# Patient Record
Sex: Female | Born: 1965 | Race: Black or African American | Hispanic: No | Marital: Single | Smoking: Never smoker
Health system: Southern US, Community
[De-identification: ages and names within clinical notes are randomized; demographics above are authoritative.]

## PROBLEM LIST (undated history)

## (undated) DIAGNOSIS — E7800 Pure hypercholesterolemia, unspecified: Secondary | ICD-10-CM

## (undated) DIAGNOSIS — M069 Rheumatoid arthritis, unspecified: Secondary | ICD-10-CM

## (undated) DIAGNOSIS — Z9079 Acquired absence of other genital organ(s): Secondary | ICD-10-CM

## (undated) DIAGNOSIS — N809 Endometriosis, unspecified: Secondary | ICD-10-CM

## (undated) DIAGNOSIS — F329 Major depressive disorder, single episode, unspecified: Secondary | ICD-10-CM

## (undated) DIAGNOSIS — I10 Essential (primary) hypertension: Secondary | ICD-10-CM

## (undated) DIAGNOSIS — F32A Depression, unspecified: Secondary | ICD-10-CM

## (undated) DIAGNOSIS — M797 Fibromyalgia: Secondary | ICD-10-CM

## (undated) HISTORY — PX: TUBAL LIGATION: SHX77

## (undated) HISTORY — PX: CERVICAL SPINE SURGERY: SHX589

## (undated) HISTORY — PX: JOINT REPLACEMENT: SHX530

---

## 1993-03-25 HISTORY — PX: SALPINGECTOMY: SHX328

## 1993-03-25 HISTORY — PX: RIGHT OOPHORECTOMY: SHX2359

## 2000-12-30 ENCOUNTER — Other Ambulatory Visit: Admission: RE | Admit: 2000-12-30 | Discharge: 2000-12-30 | Payer: Self-pay | Admitting: *Deleted

## 2008-02-10 ENCOUNTER — Emergency Department (HOSPITAL_BASED_OUTPATIENT_CLINIC_OR_DEPARTMENT_OTHER): Admission: EM | Admit: 2008-02-10 | Discharge: 2008-02-10 | Payer: Self-pay | Admitting: Emergency Medicine

## 2008-04-21 ENCOUNTER — Emergency Department (HOSPITAL_BASED_OUTPATIENT_CLINIC_OR_DEPARTMENT_OTHER): Admission: EM | Admit: 2008-04-21 | Discharge: 2008-04-21 | Payer: Self-pay | Admitting: Emergency Medicine

## 2008-04-21 ENCOUNTER — Ambulatory Visit: Payer: Self-pay | Admitting: Interventional Radiology

## 2008-06-14 ENCOUNTER — Telehealth (INDEPENDENT_AMBULATORY_CARE_PROVIDER_SITE_OTHER): Payer: Self-pay | Admitting: *Deleted

## 2008-08-02 ENCOUNTER — Emergency Department (HOSPITAL_BASED_OUTPATIENT_CLINIC_OR_DEPARTMENT_OTHER): Admission: EM | Admit: 2008-08-02 | Discharge: 2008-08-02 | Payer: Self-pay | Admitting: Emergency Medicine

## 2008-10-16 ENCOUNTER — Emergency Department (HOSPITAL_BASED_OUTPATIENT_CLINIC_OR_DEPARTMENT_OTHER): Admission: EM | Admit: 2008-10-16 | Discharge: 2008-10-16 | Payer: Self-pay | Admitting: Emergency Medicine

## 2008-10-16 ENCOUNTER — Ambulatory Visit: Payer: Self-pay | Admitting: Diagnostic Radiology

## 2008-10-23 ENCOUNTER — Emergency Department (HOSPITAL_BASED_OUTPATIENT_CLINIC_OR_DEPARTMENT_OTHER): Admission: EM | Admit: 2008-10-23 | Discharge: 2008-10-23 | Payer: Self-pay | Admitting: Emergency Medicine

## 2009-01-11 ENCOUNTER — Emergency Department (HOSPITAL_COMMUNITY): Admission: EM | Admit: 2009-01-11 | Discharge: 2009-01-11 | Payer: Self-pay | Admitting: Emergency Medicine

## 2009-07-10 ENCOUNTER — Emergency Department (HOSPITAL_BASED_OUTPATIENT_CLINIC_OR_DEPARTMENT_OTHER): Admission: EM | Admit: 2009-07-10 | Discharge: 2009-07-10 | Payer: Self-pay | Admitting: Emergency Medicine

## 2009-08-30 ENCOUNTER — Emergency Department (HOSPITAL_BASED_OUTPATIENT_CLINIC_OR_DEPARTMENT_OTHER): Admission: EM | Admit: 2009-08-30 | Discharge: 2009-08-30 | Payer: Self-pay | Admitting: Emergency Medicine

## 2009-12-11 ENCOUNTER — Emergency Department (HOSPITAL_BASED_OUTPATIENT_CLINIC_OR_DEPARTMENT_OTHER)
Admission: EM | Admit: 2009-12-11 | Discharge: 2009-12-11 | Payer: Self-pay | Source: Home / Self Care | Admitting: Emergency Medicine

## 2010-04-29 ENCOUNTER — Emergency Department (HOSPITAL_BASED_OUTPATIENT_CLINIC_OR_DEPARTMENT_OTHER)
Admission: EM | Admit: 2010-04-29 | Discharge: 2010-04-29 | Disposition: A | Discharge status: Home / Self Care | Payer: 59 | Attending: Emergency Medicine | Admitting: Emergency Medicine

## 2010-04-29 ENCOUNTER — Emergency Department (INDEPENDENT_AMBULATORY_CARE_PROVIDER_SITE_OTHER): Payer: 59

## 2010-04-29 DIAGNOSIS — R209 Unspecified disturbances of skin sensation: Secondary | ICD-10-CM

## 2010-04-29 DIAGNOSIS — M5020 Other cervical disc displacement, unspecified cervical region: Secondary | ICD-10-CM

## 2010-04-29 DIAGNOSIS — I10 Essential (primary) hypertension: Secondary | ICD-10-CM | POA: Insufficient documentation

## 2010-04-29 DIAGNOSIS — M542 Cervicalgia: Secondary | ICD-10-CM

## 2010-04-29 DIAGNOSIS — X500XXA Overexertion from strenuous movement or load, initial encounter: Secondary | ICD-10-CM | POA: Insufficient documentation

## 2010-04-29 DIAGNOSIS — IMO0001 Reserved for inherently not codable concepts without codable children: Secondary | ICD-10-CM | POA: Insufficient documentation

## 2010-04-29 DIAGNOSIS — S139XXA Sprain of joints and ligaments of unspecified parts of neck, initial encounter: Secondary | ICD-10-CM | POA: Insufficient documentation

## 2010-05-01 ENCOUNTER — Other Ambulatory Visit (HOSPITAL_COMMUNITY): Payer: Self-pay | Admitting: Orthopedic Surgery

## 2010-05-01 DIAGNOSIS — M542 Cervicalgia: Secondary | ICD-10-CM

## 2010-05-01 DIAGNOSIS — M549 Dorsalgia, unspecified: Secondary | ICD-10-CM

## 2010-05-05 ENCOUNTER — Ambulatory Visit (HOSPITAL_COMMUNITY)
Admission: RE | Admit: 2010-05-05 | Discharge: 2010-05-05 | Disposition: A | Discharge status: Home / Self Care | Payer: 59 | Source: Ambulatory Visit | Attending: Orthopedic Surgery | Admitting: Orthopedic Surgery

## 2010-05-05 ENCOUNTER — Encounter (HOSPITAL_COMMUNITY): Payer: Self-pay

## 2010-05-05 DIAGNOSIS — M5020 Other cervical disc displacement, unspecified cervical region: Secondary | ICD-10-CM | POA: Insufficient documentation

## 2010-05-05 DIAGNOSIS — M6281 Muscle weakness (generalized): Secondary | ICD-10-CM | POA: Insufficient documentation

## 2010-05-05 DIAGNOSIS — M549 Dorsalgia, unspecified: Secondary | ICD-10-CM

## 2010-05-05 DIAGNOSIS — M545 Low back pain, unspecified: Secondary | ICD-10-CM | POA: Insufficient documentation

## 2010-05-05 DIAGNOSIS — M5090 Cervical disc disorder, unspecified, unspecified cervical region: Secondary | ICD-10-CM | POA: Insufficient documentation

## 2010-05-05 DIAGNOSIS — M4802 Spinal stenosis, cervical region: Secondary | ICD-10-CM | POA: Insufficient documentation

## 2010-05-05 DIAGNOSIS — R209 Unspecified disturbances of skin sensation: Secondary | ICD-10-CM | POA: Insufficient documentation

## 2010-05-05 DIAGNOSIS — M542 Cervicalgia: Secondary | ICD-10-CM

## 2010-06-07 LAB — BASIC METABOLIC PANEL
BUN: 13 mg/dL (ref 6–23)
Chloride: 110 mEq/L (ref 96–112)
Creatinine, Ser: 0.7 mg/dL (ref 0.4–1.2)

## 2010-06-07 LAB — URINALYSIS, ROUTINE W REFLEX MICROSCOPIC
Glucose, UA: NEGATIVE mg/dL
Ketones, ur: NEGATIVE mg/dL
Protein, ur: NEGATIVE mg/dL

## 2010-06-07 LAB — PREGNANCY, URINE: Preg Test, Ur: NEGATIVE

## 2010-06-07 LAB — CBC
MCH: 31.2 pg (ref 26.0–34.0)
MCHC: 34.9 g/dL (ref 30.0–36.0)
MCV: 89.5 fL (ref 78.0–100.0)
Platelets: 270 10*3/uL (ref 150–400)
RDW: 13.4 % (ref 11.5–15.5)
WBC: 6.7 10*3/uL (ref 4.0–10.5)

## 2010-06-11 LAB — WET PREP, GENITAL: Trich, Wet Prep: NONE SEEN

## 2010-06-11 LAB — GC/CHLAMYDIA PROBE AMP, GENITAL
Chlamydia, DNA Probe: NEGATIVE
GC Probe Amp, Genital: NEGATIVE

## 2010-06-11 LAB — URINALYSIS, ROUTINE W REFLEX MICROSCOPIC
Bilirubin Urine: NEGATIVE
Hgb urine dipstick: NEGATIVE
Ketones, ur: NEGATIVE mg/dL
Protein, ur: NEGATIVE mg/dL
Urobilinogen, UA: 0.2 mg/dL (ref 0.0–1.0)

## 2010-06-11 LAB — RPR: RPR Ser Ql: NONREACTIVE

## 2010-06-28 LAB — TROPONIN I: Troponin I: 0.02 ng/mL (ref 0.00–0.06)

## 2010-06-28 LAB — CK TOTAL AND CKMB (NOT AT ARMC)
CK, MB: 1.7 ng/mL (ref 0.3–4.0)
Relative Index: 0.5 (ref 0.0–2.5)

## 2010-06-28 LAB — CBC
HCT: 35.7 % — ABNORMAL LOW (ref 36.0–46.0)
MCHC: 33.1 g/dL (ref 30.0–36.0)
Platelets: 167 10*3/uL (ref 150–400)
RDW: 13.8 % (ref 11.5–15.5)

## 2010-06-28 LAB — BASIC METABOLIC PANEL
BUN: 11 mg/dL (ref 6–23)
Calcium: 8.9 mg/dL (ref 8.4–10.5)
GFR calc non Af Amer: >60 mL/min (ref >60)
Glucose, Bld: 79 mg/dL (ref 70–99)

## 2010-06-28 LAB — DIFFERENTIAL
Basophils Relative: 1 % (ref 0–1)
Eosinophils Absolute: 0.2 10*3/uL (ref 0.0–0.7)
Eosinophils Relative: 3 % (ref 0–5)
Lymphocytes Relative: 31 % (ref 12–46)
Neutro Abs: 4.2 10*3/uL (ref 1.7–7.7)

## 2010-06-30 LAB — POCT CARDIAC MARKERS
CKMB, poc: <1 ng/mL — ABNORMAL LOW (ref 1.0–8.0)
Myoglobin, poc: 70.2 ng/mL (ref 12–200)
Troponin i, poc: <0.05 ng/mL (ref 0.00–0.09)

## 2010-06-30 LAB — CBC
RBC: 4.64 MIL/uL (ref 3.87–5.11)
WBC: 7 10*3/uL (ref 4.0–10.5)

## 2010-06-30 LAB — BASIC METABOLIC PANEL
Calcium: 9.9 mg/dL (ref 8.4–10.5)
Chloride: 100 mEq/L (ref 96–112)
Creatinine, Ser: 0.9 mg/dL (ref 0.4–1.2)
GFR calc Af Amer: >60 mL/min (ref >60)
GFR calc non Af Amer: >60 mL/min (ref >60)

## 2010-07-03 LAB — PREGNANCY, URINE: Preg Test, Ur: NEGATIVE

## 2010-07-03 LAB — URINALYSIS, ROUTINE W REFLEX MICROSCOPIC
Bilirubin Urine: NEGATIVE
Hgb urine dipstick: NEGATIVE
Ketones, ur: NEGATIVE mg/dL
Nitrite: NEGATIVE
Urobilinogen, UA: 1 mg/dL (ref 0.0–1.0)

## 2010-07-03 LAB — RPR: RPR Ser Ql: NONREACTIVE

## 2010-07-03 LAB — GC/CHLAMYDIA PROBE AMP, GENITAL
Chlamydia, DNA Probe: NEGATIVE
GC Probe Amp, Genital: NEGATIVE

## 2010-07-09 LAB — CBC
MCHC: 32.8 g/dL (ref 30.0–36.0)
MCV: 88 fL (ref 78.0–100.0)
Platelets: 259 10*3/uL (ref 150–400)
RBC: 4.35 MIL/uL (ref 3.87–5.11)
RDW: 13.3 % (ref 11.5–15.5)

## 2010-07-09 LAB — URINE CULTURE: Colony Count: >=100000

## 2010-07-09 LAB — DIFFERENTIAL
Basophils Absolute: 0 10*3/uL (ref 0.0–0.1)
Basophils Relative: 1 % (ref 0–1)
Eosinophils Absolute: 0.2 10*3/uL (ref 0.0–0.7)
Monocytes Relative: 8 % (ref 3–12)
Neutro Abs: 2.4 10*3/uL (ref 1.7–7.7)
Neutrophils Relative %: 52 % (ref 43–77)

## 2010-07-09 LAB — URINALYSIS, ROUTINE W REFLEX MICROSCOPIC
Glucose, UA: NEGATIVE mg/dL
Ketones, ur: NEGATIVE mg/dL
Nitrite: NEGATIVE
Specific Gravity, Urine: 1.027 (ref 1.005–1.030)
pH: 6 (ref 5.0–8.0)

## 2010-07-09 LAB — BASIC METABOLIC PANEL
BUN: 10 mg/dL (ref 6–23)
CO2: 22 mEq/L (ref 19–32)
Calcium: 9.4 mg/dL (ref 8.4–10.5)
Chloride: 108 mEq/L (ref 96–112)
Creatinine, Ser: 0.7 mg/dL (ref 0.4–1.2)
GFR calc Af Amer: >60 mL/min (ref >60)
Glucose, Bld: 84 mg/dL (ref 70–99)

## 2010-07-09 LAB — WET PREP, GENITAL
Clue Cells Wet Prep HPF POC: NONE SEEN
Trich, Wet Prep: NONE SEEN

## 2010-08-12 ENCOUNTER — Emergency Department (HOSPITAL_BASED_OUTPATIENT_CLINIC_OR_DEPARTMENT_OTHER)
Admission: EM | Admit: 2010-08-12 | Discharge: 2010-08-12 | Disposition: A | Discharge status: Home / Self Care | Payer: 59 | Attending: Emergency Medicine | Admitting: Emergency Medicine

## 2010-08-12 DIAGNOSIS — A499 Bacterial infection, unspecified: Secondary | ICD-10-CM | POA: Insufficient documentation

## 2010-08-12 DIAGNOSIS — IMO0001 Reserved for inherently not codable concepts without codable children: Secondary | ICD-10-CM | POA: Insufficient documentation

## 2010-08-12 DIAGNOSIS — I10 Essential (primary) hypertension: Secondary | ICD-10-CM | POA: Insufficient documentation

## 2010-08-12 DIAGNOSIS — B9689 Other specified bacterial agents as the cause of diseases classified elsewhere: Secondary | ICD-10-CM | POA: Insufficient documentation

## 2010-08-12 DIAGNOSIS — N72 Inflammatory disease of cervix uteri: Secondary | ICD-10-CM | POA: Insufficient documentation

## 2010-08-12 DIAGNOSIS — E7800 Pure hypercholesterolemia, unspecified: Secondary | ICD-10-CM | POA: Insufficient documentation

## 2010-08-12 DIAGNOSIS — Z79899 Other long term (current) drug therapy: Secondary | ICD-10-CM | POA: Insufficient documentation

## 2010-08-12 DIAGNOSIS — N760 Acute vaginitis: Secondary | ICD-10-CM | POA: Insufficient documentation

## 2010-08-12 LAB — URINALYSIS, ROUTINE W REFLEX MICROSCOPIC
Hgb urine dipstick: NEGATIVE
Nitrite: NEGATIVE
Protein, ur: NEGATIVE mg/dL
Specific Gravity, Urine: 1.024 (ref 1.005–1.030)
Urobilinogen, UA: 0.2 mg/dL (ref 0.0–1.0)

## 2010-08-12 LAB — WET PREP, GENITAL
Trich, Wet Prep: NONE SEEN
Yeast Wet Prep HPF POC: NONE SEEN

## 2010-08-13 LAB — GC/CHLAMYDIA PROBE AMP, GENITAL: GC Probe Amp, Genital: NEGATIVE

## 2010-12-26 LAB — WET PREP, GENITAL
Trich, Wet Prep: NONE SEEN
Yeast Wet Prep HPF POC: NONE SEEN

## 2010-12-26 LAB — DIFFERENTIAL
Basophils Absolute: 0
Eosinophils Absolute: 0.2
Eosinophils Relative: 4
Lymphocytes Relative: 42
Neutrophils Relative %: 45

## 2010-12-26 LAB — GC/CHLAMYDIA PROBE AMP, GENITAL: Chlamydia, DNA Probe: NEGATIVE

## 2010-12-26 LAB — CBC
HCT: 37
MCV: 87.2
Platelets: 285
RDW: 13

## 2011-01-05 ENCOUNTER — Emergency Department (HOSPITAL_BASED_OUTPATIENT_CLINIC_OR_DEPARTMENT_OTHER)
Admission: EM | Admit: 2011-01-05 | Discharge: 2011-01-05 | Disposition: A | Discharge status: Home / Self Care | Payer: 59 | Attending: Emergency Medicine | Admitting: Emergency Medicine

## 2011-01-05 ENCOUNTER — Encounter (HOSPITAL_BASED_OUTPATIENT_CLINIC_OR_DEPARTMENT_OTHER): Payer: Self-pay | Admitting: *Deleted

## 2011-01-05 DIAGNOSIS — Z79899 Other long term (current) drug therapy: Secondary | ICD-10-CM | POA: Insufficient documentation

## 2011-01-05 DIAGNOSIS — N760 Acute vaginitis: Secondary | ICD-10-CM | POA: Insufficient documentation

## 2011-01-05 DIAGNOSIS — R109 Unspecified abdominal pain: Secondary | ICD-10-CM | POA: Insufficient documentation

## 2011-01-05 DIAGNOSIS — A499 Bacterial infection, unspecified: Secondary | ICD-10-CM | POA: Insufficient documentation

## 2011-01-05 DIAGNOSIS — B9689 Other specified bacterial agents as the cause of diseases classified elsewhere: Secondary | ICD-10-CM | POA: Insufficient documentation

## 2011-01-05 HISTORY — DX: Fibromyalgia: M79.7

## 2011-01-05 LAB — URINALYSIS, ROUTINE W REFLEX MICROSCOPIC
Ketones, ur: NEGATIVE mg/dL
Leukocytes, UA: NEGATIVE
Nitrite: NEGATIVE
Protein, ur: NEGATIVE mg/dL
Urobilinogen, UA: 0.2 mg/dL (ref 0.0–1.0)

## 2011-01-05 LAB — WET PREP, GENITAL
Trich, Wet Prep: NONE SEEN
Yeast Wet Prep HPF POC: NONE SEEN

## 2011-01-05 MED ORDER — METRONIDAZOLE 500 MG PO TABS: 500.0000 mg | ORAL_TABLET | Freq: Two times a day (BID) | ORAL | Status: AC

## 2011-01-05 NOTE — ED Notes (Signed)
Pt states she has had low abd pain with yellow, odorous discharge since Wed. Also c/o nausea.

## 2011-01-05 NOTE — ED Notes (Signed)
Pt pleasant 7/10 pain pain control reviewed

## 2011-01-05 NOTE — ED Notes (Signed)
Care plan and use of meds reviewed 

## 2011-01-05 NOTE — ED Provider Notes (Addendum)
History    Scribed for Nat Christen, MD, the patient was seen in room MH09/MH09. This chart was scribed by Katha Cabal. This patient's care was started at 5:14 PM.     CSN: [id number] Arrival date & time: 01/05/2011  5:01 PM  Chief Complaint  Patient presents with  . Abdominal Pain    (Consider location/radiation/quality/duration/timing/severity/associated sxs/prior treatment) HPI    Katherine Wiggins is a 45 y.o. female who presents to the Emergency Department complaining of persistent suprapubic abdominal pain with associated  x4 days abdominal pain with odorous vaginal discharge.  Denies dysuria, abnormal menstrual and abnlm BMs.  Denies sexual acitivity.  Patient has fibromyalgia.   APtient had ovarian cysts hx.  Patient had Bacterial vagianonsis.   With associated   Denies  Previous Hx PCP  HPI ELEMENTS:  Location: suprapubic  Onset: 3 days ago Duration: 4 days  Timing: Constant  Quality: Mild  Severity: Mild  Modifying factors: None  Context:  as above  Associated symptoms: No fevers, no dysuria.  PAST MEDICAL HISTORY:  History reviewed. No pertinent past medical history.  PAST SURGICAL HISTORY:  History reviewed. No pertinent past surgical history.  FAMILY HISTORY:  History reviewed. No pertinent family history.   SOCIAL HISTORY: History   Social History  . Marital Status: Single    Spouse Name: N/A    Number of Children: N/A  . Years of Education: N/A   Social History Main Topics  . Smoking status: None  . Smokeless tobacco: None  . Alcohol Use: None  . Drug Use: None  . Sexually Active: None   Other Topics Concern  . None   Social History Narrative  . None    Review of Systems  Constitutional: Negative.  Negative for fever and chills.  HENT: Negative.   Eyes: Negative.  Negative for discharge and redness.  Respiratory: Negative.  Negative for cough and shortness of breath.   Cardiovascular: Negative.  Negative for chest  pain.  Gastrointestinal: Positive for nausea. Negative for vomiting, abdominal pain and diarrhea.  Genitourinary: Positive for vaginal discharge. Negative for dysuria.  Musculoskeletal: Negative.  Negative for back pain.  Skin: Negative.  Negative for color change and rash.  Neurological: Negative.  Negative for syncope and headaches.  Hematological: Negative.  Negative for adenopathy.  Psychiatric/Behavioral: Negative.  Negative for confusion.  All other systems reviewed and are negative.    Allergies  Codeine; Macrodantin; and Septra  Home Medications   Current Outpatient Rx  Name Route Sig Dispense Refill  . CITALOPRAM HYDROBROMIDE 40 MG PO TABS Oral Take 40 mg by mouth daily.      Marland Kitchen DOXEPIN HCL 100 MG PO CAPS Oral Take 100 mg by mouth daily.      Marland Kitchen METOPROLOL SUCCINATE 50 MG PO TB24 Oral Take 50 mg by mouth daily.      Marland Kitchen SIMVASTATIN 40 MG PO TABS Oral Take 40 mg by mouth at bedtime.        BP 127/74  Pulse 60  Temp(Src) 98.2 F (36.8 C) (Oral)  Resp 20  Ht 5\' 3"  (1.6 m)  Wt 150 lb (68.04 kg)  BMI 26.57 kg/m2  SpO2 98%  LMP 12/27/2010  Physical Exam  Constitutional: She is oriented to person, place, and time. She appears well-developed and well-nourished.  Non-toxic appearance. She does not have a sickly appearance.  HENT:  Head: Normocephalic and atraumatic.  Eyes: Conjunctivae, EOM and lids are normal. Pupils are equal, round, and reactive to light.  No scleral icterus.  Neck: Trachea normal and normal range of motion. Neck supple.  Cardiovascular: Normal rate, regular rhythm and normal heart sounds.   Pulmonary/Chest: Effort normal and breath sounds normal.  Abdominal: Soft. Normal appearance. There is tenderness (mild ) in the suprapubic area. There is no rebound, no guarding and no CVA tenderness.  Genitourinary: There is no rash or lesion on the right labia. There is no rash or lesion on the left labia. No erythema, tenderness or bleeding around the vagina. No  foreign body around the vagina. No signs of injury around the vagina. Vaginal discharge found.       Moderate amount of white foul-smelling discharge present in vaginal vault. No bleeding. Cervical os is closed. No cervical motion tenderness on exam. No cervical erythema.  Musculoskeletal: Normal range of motion.  Neurological: She is alert and oriented to person, place, and time. She has normal strength.  Skin: Skin is warm, dry and intact. No rash noted.  Psychiatric: She has a normal mood and affect. Her behavior is normal.    ED Course  Procedures (including critical care time) Results for orders placed during the hospital encounter of 01/05/11  URINALYSIS, ROUTINE W REFLEX MICROSCOPIC      Component Value Range   Color, Urine YELLOW  YELLOW    Appearance CLEAR  CLEAR    Specific Gravity, Urine 1.021  1.005 - 1.030    pH 8.0  5.0 - 8.0    Glucose, UA NEGATIVE  NEGATIVE (mg/dL)   Hgb urine dipstick NEGATIVE  NEGATIVE    Bilirubin Urine NEGATIVE  NEGATIVE    Ketones, ur NEGATIVE  NEGATIVE (mg/dL)   Protein, ur NEGATIVE  NEGATIVE (mg/dL)   Urobilinogen, UA 0.2  0.0 - 1.0 (mg/dL)   Nitrite NEGATIVE  NEGATIVE    Leukocytes, UA NEGATIVE  NEGATIVE   PREGNANCY, URINE      Component Value Range   Preg Test, Ur NEGATIVE     No results found.       5:18 PM  Mild suprapubic tenderness.  Will perform pelvic exam.   MDM  Patient appears to have bacterial vaginosis on exam. As she is not sexually active and has no cervical motion tenderness this does not appear to be consistent with a sexual transmitted infection or with PID. Patient has been counseled that should her symptoms not improve she needs to followup with her gynecologist Dr. Shawnie Pons. Vision does describe some mild nausea and suprapubic pain it seems less likely because by bacterial vaginosis but has a relatively benign exam and I feel can be followed as an outpatient at this time. Patient understands again if her symptoms worsen  that she should be reevaluated.      I personally performed the services described in this documentation, which was scribed in my presence. The recorded information has been reviewed and considered.   Nat Christen, MD 01/05/11 1736  Nat Christen, MD 01/05/11 [phone]

## 2011-01-07 LAB — GC/CHLAMYDIA PROBE AMP, GENITAL: GC Probe Amp, Genital: NEGATIVE

## 2011-07-08 ENCOUNTER — Emergency Department (HOSPITAL_BASED_OUTPATIENT_CLINIC_OR_DEPARTMENT_OTHER)
Admission: EM | Admit: 2011-07-08 | Discharge: 2011-07-08 | Disposition: A | Discharge status: Home / Self Care | Payer: 59 | Attending: Emergency Medicine | Admitting: Emergency Medicine

## 2011-07-08 ENCOUNTER — Encounter (HOSPITAL_BASED_OUTPATIENT_CLINIC_OR_DEPARTMENT_OTHER): Payer: Self-pay | Admitting: Family Medicine

## 2011-07-08 DIAGNOSIS — M542 Cervicalgia: Secondary | ICD-10-CM | POA: Insufficient documentation

## 2011-07-08 DIAGNOSIS — E7800 Pure hypercholesterolemia, unspecified: Secondary | ICD-10-CM | POA: Insufficient documentation

## 2011-07-08 DIAGNOSIS — I10 Essential (primary) hypertension: Secondary | ICD-10-CM | POA: Insufficient documentation

## 2011-07-08 HISTORY — DX: Essential (primary) hypertension: I10

## 2011-07-08 HISTORY — DX: Major depressive disorder, single episode, unspecified: F32.9

## 2011-07-08 HISTORY — DX: Depression, unspecified: F32.A

## 2011-07-08 HISTORY — DX: Pure hypercholesterolemia, unspecified: E78.00

## 2011-07-08 MED ORDER — METHOCARBAMOL 500 MG PO TABS: 500.0000 mg | ORAL_TABLET | Freq: Two times a day (BID) | ORAL | Status: AC

## 2011-07-08 MED ORDER — TRAMADOL HCL 50 MG PO TABS: 50.0000 mg | ORAL_TABLET | Freq: Four times a day (QID) | ORAL | Status: AC | PRN

## 2011-07-08 NOTE — ED Notes (Addendum)
Pt c/o right lateral neck pain since yesterday, denies injury. Pt describes pain as "aching" and "tender to touch". Pt reports h/o disc surgery and sts "this pain is totally different". Pt sts she took ibuprofen for pain last night without relief.

## 2011-07-08 NOTE — ED Provider Notes (Signed)
History     CSN: [id number]  Arrival date & time 07/08/11  0810   First MD Initiated Contact with Patient 07/08/11 0826     8:38 AM HPI Patient reports yesterday gradually began having right-sided neck pain. Reports pain was worse with movement. States pain is not relieved with ibuprofen. Denies known injury. 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 Denies midline neck pain, shoulder pain, or fever. Reports pain is severe it appears giving her a mild right-sided headache as well. Patient is a 46 y.o. female presenting with neck pain.  Neck Pain  This is a new problem. The current episode started yesterday. The problem occurs constantly. The problem has been gradually worsening. The pain is associated with an unknown factor. There has been no fever. The pain is present in the right side. The quality of the pain is described as aching. The pain does not radiate. The pain is moderate. The symptoms are aggravated by twisting and bending (Movement of neck). Associated symptoms include headaches. Pertinent negatives include no photophobia, no visual change, no numbness, no paresis, no tingling and no weakness.    Past Medical History  Diagnosis Date  . Fibromyalgia   . Hypertension   . Depression   . Asthma   . High cholesterol     Past Surgical History  Procedure Date  . Right oophorectomy   . Cervical spine surgery     No family history on file.  History  Substance Use Topics  . Smoking status: Never Smoker   . Smokeless tobacco: Not on file  . Alcohol Use: No    OB History    Grav Para Term Preterm Abortions TAB SAB Ect Mult Living                  Review of Systems  Constitutional: Negative for fever and chills.  HENT: Positive for neck pain. Negative for sore throat, rhinorrhea and sinus pressure.   Eyes: Negative for photophobia.  Gastrointestinal: Negative for nausea and vomiting.  Musculoskeletal: Negative for back pain.    Neurological: Positive for headaches. Negative for dizziness, tingling, weakness, light-headedness and numbness.    Allergies  Codeine; Macrodantin; and Septra  Home Medications   Current Outpatient Rx  Name Route Sig Dispense Refill  . CITALOPRAM HYDROBROMIDE 40 MG PO TABS Oral Take 40 mg by mouth daily.      Marland Kitchen DOXEPIN HCL 100 MG PO CAPS Oral Take 100 mg by mouth daily.      Marland Kitchen METOPROLOL SUCCINATE ER 50 MG PO TB24 Oral Take 50 mg by mouth daily.      Marland Kitchen SIMVASTATIN 40 MG PO TABS Oral Take 40 mg by mouth at bedtime.        BP 150/86  Pulse 63  Temp(Src) 98.1 F (36.7 C) (Oral)  Resp 16  Ht 5\' 3"  (1.6 m)  Wt 160 lb (72.576 kg)  BMI 28.34 kg/m2  SpO2 100%  Physical Exam  Vitals reviewed. Constitutional: She is oriented to person, place, and time. Vital signs are normal. She appears well-developed and well-nourished. No distress.  HENT:  Head: Normocephalic and atraumatic.  Eyes: Pupils are equal, round, and reactive to light.  Neck: Trachea normal and normal range of motion. Neck supple. Muscular tenderness present. No spinous process tenderness present. No rigidity. No edema, no erythema and normal range of motion present. No Brudzinski's sign and no Kernig's sign noted. No mass and no thyromegaly present.  Pulmonary/Chest: Effort normal.  Neurological: She is alert and oriented to person, place, and time.  Skin: Skin is warm and dry. No rash noted. No erythema. No pallor.  Psychiatric: She has a normal mood and affect. Her behavior is normal.    ED Course  Procedures   MDM   She with muscle relaxants and Ultram. Advised patient to do gentle range of motion exercises, warm compresses, and to continue ibuprofen. Patient agrees with plan and is ready for discharge.       Thomasene Lot, PA-C 07/08/11 [phone]

## 2011-07-08 NOTE — ED Provider Notes (Signed)
Medical screening examination/treatment/procedure(s) were performed by non-physician practitioner and as supervising physician I was immediately available for consultation/collaboration.   Elnita Surprenant, MD 07/08/11 1200 

## 2011-07-10 ENCOUNTER — Emergency Department (HOSPITAL_BASED_OUTPATIENT_CLINIC_OR_DEPARTMENT_OTHER)
Admission: EM | Admit: 2011-07-10 | Discharge: 2011-07-10 | Disposition: A | Discharge status: Home / Self Care | Payer: 59 | Attending: Emergency Medicine | Admitting: Emergency Medicine

## 2011-07-10 ENCOUNTER — Encounter (HOSPITAL_BASED_OUTPATIENT_CLINIC_OR_DEPARTMENT_OTHER): Payer: Self-pay | Admitting: *Deleted

## 2011-07-10 DIAGNOSIS — I10 Essential (primary) hypertension: Secondary | ICD-10-CM | POA: Insufficient documentation

## 2011-07-10 DIAGNOSIS — Z79899 Other long term (current) drug therapy: Secondary | ICD-10-CM | POA: Insufficient documentation

## 2011-07-10 DIAGNOSIS — E789 Disorder of lipoprotein metabolism, unspecified: Secondary | ICD-10-CM | POA: Insufficient documentation

## 2011-07-10 DIAGNOSIS — M542 Cervicalgia: Secondary | ICD-10-CM | POA: Insufficient documentation

## 2011-07-10 DIAGNOSIS — S161XXA Strain of muscle, fascia and tendon at neck level, initial encounter: Secondary | ICD-10-CM

## 2011-07-10 DIAGNOSIS — IMO0001 Reserved for inherently not codable concepts without codable children: Secondary | ICD-10-CM | POA: Insufficient documentation

## 2011-07-10 DIAGNOSIS — F329 Major depressive disorder, single episode, unspecified: Secondary | ICD-10-CM | POA: Insufficient documentation

## 2011-07-10 DIAGNOSIS — J45909 Unspecified asthma, uncomplicated: Secondary | ICD-10-CM | POA: Insufficient documentation

## 2011-07-10 DIAGNOSIS — F3289 Other specified depressive episodes: Secondary | ICD-10-CM | POA: Insufficient documentation

## 2011-07-10 MED ORDER — HYDROCODONE-ACETAMINOPHEN 5-325 MG PO TABS: 1.0000 | ORAL_TABLET | ORAL | Status: AC | PRN

## 2011-07-10 NOTE — ED Provider Notes (Signed)
History     CSN: [id number]  Arrival date & time 07/10/11  1301   First MD Initiated Contact with Patient 07/10/11 1502      Chief Complaint  Patient presents with  . Neck Pain    (Consider location/radiation/quality/duration/timing/severity/associated sxs/prior treatment) HPI Comments: Patient presents with right-sided neck pain posteriorly that began on Monday.  She was seen here on Monday and given Robaxin and tramadol.  Patient states her pain is improved.  It now radiates down the right lateral aspect of her neck.  No fevers.  No sore throat.  No cold symptoms.  No nausea or vomiting.  The pain is worse with moving her neck.  She has not seen her primary care physician nor Dr. Lovell Sheehan for this yetwho  was her spine surgeon.  Patient is a 46 y.o. female presenting with neck pain. The history is provided by the patient. No language interpreter was used.  Neck Pain  This is a new problem. The current episode started 2 days ago. The problem occurs constantly. The problem has not changed since onset.Associated with: Worse with movement. There has been no fever. The pain is present in the right side. The quality of the pain is described as aching. The pain is moderate. The symptoms are aggravated by twisting. Pertinent negatives include no chest pain and no headaches.    Past Medical History  Diagnosis Date  . Fibromyalgia   . Hypertension   . Depression   . Asthma   . High cholesterol     Past Surgical History  Procedure Date  . Right oophorectomy   . Cervical spine surgery     History reviewed. No pertinent family history.  History  Substance Use Topics  . Smoking status: Never Smoker   . Smokeless tobacco: Not on file  . Alcohol Use: No    OB History    Grav Para Term Preterm Abortions TAB SAB Ect Mult Living                  Review of Systems  Constitutional: Negative.  Negative for fever and chills.  HENT: Positive for neck pain.   Eyes: Negative.  Negative  for discharge and redness.  Respiratory: Negative.  Negative for cough and shortness of breath.   Cardiovascular: Negative.  Negative for chest pain.  Gastrointestinal: Negative.  Negative for nausea, vomiting, abdominal pain and diarrhea.  Genitourinary: Negative.  Negative for dysuria and vaginal discharge.  Musculoskeletal: Negative for back pain.  Skin: Negative.  Negative for color change and rash.  Neurological: Negative.  Negative for syncope and headaches.  Hematological: Negative.  Negative for adenopathy.  Psychiatric/Behavioral: Negative.  Negative for confusion.  All other systems reviewed and are negative.    Allergies  Codeine; Macrodantin; and Septra  Home Medications   Current Outpatient Rx  Name Route Sig Dispense Refill  . CITALOPRAM HYDROBROMIDE 40 MG PO TABS Oral Take 40 mg by mouth daily.      Marland Kitchen DOXEPIN HCL 100 MG PO CAPS Oral Take 100 mg by mouth daily.      Marland Kitchen METHOCARBAMOL 500 MG PO TABS Oral Take 1 tablet (500 mg total) by mouth 2 (two) times daily. 20 tablet 0  . METOPROLOL SUCCINATE ER 50 MG PO TB24 Oral Take 50 mg by mouth daily.      Marland Kitchen SIMVASTATIN 40 MG PO TABS Oral Take 40 mg by mouth at bedtime.      . TRAMADOL HCL 50 MG PO TABS Oral  Take 1 tablet (50 mg total) by mouth every 6 (six) hours as needed for pain. 16 tablet 0    BP 143/78  Pulse 84  Temp 98.8 F (37.1 C)  Resp 16  Ht 5\' 3"  (1.6 m)  Wt 160 lb (72.576 kg)  BMI 28.34 kg/m2  SpO2 100%  Physical Exam  Nursing note and vitals reviewed. Constitutional: She is oriented to person, place, and time. She appears well-developed and well-nourished.  Non-toxic appearance. She does not have a sickly appearance.  HENT:  Head: Normocephalic and atraumatic.  Eyes: Conjunctivae, EOM and lids are normal. Pupils are equal, round, and reactive to light. No scleral icterus.  Neck: Trachea normal and normal range of motion. Neck supple.       Right SCM and posterior neck muscles appear more tense.   Patient is able to range her neck left and right  without significant difficulty.  Cardiovascular: Normal rate, regular rhythm and normal heart sounds.   Pulmonary/Chest: Effort normal and breath sounds normal. No respiratory distress. She has no wheezes. She has no rales.  Abdominal: Soft. Normal appearance. There is no tenderness. There is no rebound, no guarding and no CVA tenderness.  Musculoskeletal: Normal range of motion.       Palpable radial pulses bilaterally.  Sensation to light touch intact in both hands.  Lymphadenopathy:    She has no cervical adenopathy.  Neurological: She is alert and oriented to person, place, and time. She has normal strength.  Skin: Skin is warm, dry and intact. No rash noted.  Psychiatric: She has a normal mood and affect. Her behavior is normal. Judgment and thought content normal.    ED Course  Procedures (including critical care time)  Labs Reviewed - No data to display No results found.   No diagnosis found.    MDM  Patient presents with right-sided neck pain that has been ongoing sense Monday.  There is increased tension in the SCM on the right and posterior neck muscles compared to the left.  I believe this is likely a musculoskeletal strain.  There is no symptoms of infection.  There is no lymphadenopathy or swelling at this time.  Patient will be continued on muscle relaxants and pain medicines.  I advised her to followup with her primary care physician as well.        Nat Christen, MD 07/10/11 [phone]

## 2011-07-10 NOTE — ED Notes (Signed)
Pt seen here Sunday for same, pt states neck pain cont and no relief from meds

## 2011-07-10 NOTE — Discharge Instructions (Signed)
Please followup with your primary care physician if this persists over the next week or consider making an appointment to see Dr. Lovell Sheehan.  Cervical Sprain A cervical sprain is an injury in the neck in which the ligaments are stretched or torn. The ligaments are the tissues that hold the bones of the neck (vertebrae) in place.Cervical sprains can range from very mild to very severe. Most cervical sprains get better in 1 to 3 weeks, but it depends on the cause and extent of the injury. Severe cervical sprains can cause the neck vertebrae to be unstable. This can lead to damage of the spinal cord and can result in serious nervous system problems. Your caregiver will determine whether your cervical sprain is mild or severe. CAUSES  Severe cervical sprains may be caused by:  Contact sport injuries (football, rugby, wrestling, hockey, auto racing, gymnastics, diving, martial arts, boxing).   Motor vehicle collisions.   Whiplash injuries. This means the neck is forcefully whipped backward and forward.   Falls.  Mild cervical sprains may be caused by:   Awkward positions, such as cradling a telephone between your ear and shoulder.   Sitting in a chair that does not offer proper support.   Working at a poorly Marketing executive station.   Activities that require looking up or down for long periods of time.  SYMPTOMS   Pain, soreness, stiffness, or a burning sensation in the front, back, or sides of the neck. This discomfort may develop immediately after injury or it may develop slowly and not begin for 24 hours or more after an injury.   Pain or tenderness directly in the middle of the back of the neck.   Shoulder or upper back pain.   Limited ability to move the neck.   Headache.   Dizziness.   Weakness, numbness, or tingling in the hands or arms.   Muscle spasms.   Difficulty swallowing or chewing.   Tenderness and swelling of the neck.  DIAGNOSIS  Most of the time, your  caregiver can diagnose this problem by taking your history and doing a physical exam. Your caregiver will ask about any known problems, such as arthritis in the neck or a previous neck injury. X-rays may be taken to find out if there are any other problems, such as problems with the bones of the neck. However, an X-ray often does not reveal the full extent of a cervical sprain. Other tests such as a computed tomography (CT) scan or magnetic resonance imaging (MRI) may be needed. TREATMENT  Treatment depends on the severity of the cervical sprain. Mild sprains can be treated with rest, keeping the neck in place (immobilization), and pain medicines. Severe cervical sprains need immediate immobilization and an appointment with an orthopedist or neurosurgeon. Several treatment options are available to help with pain, muscle spasms, and other symptoms. Your caregiver may prescribe:  Medicines, such as pain relievers, numbing medicines, or muscle relaxants.   Physical therapy. This can include stretching exercises, strengthening exercises, and posture training. Exercises and improved posture can help stabilize the neck, strengthen muscles, and help stop symptoms from returning.   A neck collar to be worn for short periods of time. Often, these collars are worn for comfort. However, certain collars may be worn to protect the neck and prevent further worsening of a serious cervical sprain.  HOME CARE INSTRUCTIONS   Put ice on the injured area.   Put ice in a plastic bag.   Place a towel between  your skin and the bag.   Leave the ice on for 15 to 20 minutes, 3 to 4 times a day.   Only take over-the-counter or prescription medicines for pain, discomfort, or fever as directed by your caregiver.   Keep all follow-up appointments as directed by your caregiver.   Keep all physical therapy appointments as directed by your caregiver.   If a neck collar is prescribed, wear it as directed by your caregiver.     Do not drive while wearing a neck collar.   Make any needed adjustments to your work station to promote good posture.   Avoid positions and activities that make your symptoms worse.   Warm up and stretch before being active to help prevent problems.  SEEK MEDICAL CARE IF:   Your pain is not controlled with medicine.   You are unable to decrease your pain medicine over time as planned.   Your activity level is not improving as expected.  SEEK IMMEDIATE MEDICAL CARE IF:   You develop any bleeding, stomach upset, or signs of an allergic reaction to your medicine.   Your symptoms get worse.   You develop new, unexplained symptoms.   You have numbness, tingling, weakness, or paralysis in any part of your body.  MAKE SURE YOU:   Understand these instructions.   Will watch your condition.   Will get help right away if you are not doing well or get worse.  Document Released: 01/06/2007 Document Revised: 02/28/2011 Document Reviewed: 12/12/2010 [facility] Patient Information 2012 [place], [place].

## 2011-08-28 ENCOUNTER — Emergency Department (HOSPITAL_COMMUNITY)
Admission: EM | Admit: 2011-08-28 | Discharge: 2011-08-28 | Discharge status: Against Medical Advice | Payer: 59 | Attending: Emergency Medicine | Admitting: Emergency Medicine

## 2011-08-28 DIAGNOSIS — R42 Dizziness and giddiness: Secondary | ICD-10-CM | POA: Insufficient documentation

## 2011-08-28 DIAGNOSIS — R109 Unspecified abdominal pain: Secondary | ICD-10-CM | POA: Insufficient documentation

## 2011-08-28 DIAGNOSIS — H9209 Otalgia, unspecified ear: Secondary | ICD-10-CM | POA: Insufficient documentation

## 2011-09-27 ENCOUNTER — Encounter: Payer: Self-pay | Admitting: Obstetrics and Gynecology

## 2011-10-10 ENCOUNTER — Other Ambulatory Visit (HOSPITAL_COMMUNITY)
Admission: RE | Admit: 2011-10-10 | Discharge: 2011-10-10 | Disposition: A | Discharge status: Home / Self Care | Payer: 59 | Source: Ambulatory Visit | Attending: Obstetrics & Gynecology | Admitting: Obstetrics & Gynecology

## 2011-10-10 ENCOUNTER — Other Ambulatory Visit: Payer: Self-pay | Admitting: Obstetrics & Gynecology

## 2011-10-10 DIAGNOSIS — Z1151 Encounter for screening for human papillomavirus (HPV): Secondary | ICD-10-CM | POA: Insufficient documentation

## 2011-10-10 DIAGNOSIS — Z124 Encounter for screening for malignant neoplasm of cervix: Secondary | ICD-10-CM | POA: Insufficient documentation

## 2011-10-12 ENCOUNTER — Encounter (HOSPITAL_COMMUNITY): Payer: Self-pay | Admitting: Pharmacist

## 2011-10-23 ENCOUNTER — Encounter (HOSPITAL_COMMUNITY): Admission: RE | Payer: Self-pay | Source: Ambulatory Visit

## 2011-10-23 ENCOUNTER — Inpatient Hospital Stay (HOSPITAL_COMMUNITY): Admission: RE | Admit: 2011-10-23 | Payer: 59 | Source: Ambulatory Visit | Admitting: Obstetrics & Gynecology

## 2011-10-23 SURGERY — HYSTERECTOMY, TOTAL, LAPAROSCOPIC
Anesthesia: General

## 2011-11-19 ENCOUNTER — Emergency Department (HOSPITAL_BASED_OUTPATIENT_CLINIC_OR_DEPARTMENT_OTHER): Payer: 59

## 2011-11-19 ENCOUNTER — Encounter (HOSPITAL_BASED_OUTPATIENT_CLINIC_OR_DEPARTMENT_OTHER): Payer: Self-pay | Admitting: *Deleted

## 2011-11-19 ENCOUNTER — Emergency Department (HOSPITAL_BASED_OUTPATIENT_CLINIC_OR_DEPARTMENT_OTHER)
Admission: EM | Admit: 2011-11-19 | Discharge: 2011-11-19 | Disposition: A | Discharge status: Home / Self Care | Payer: 59 | Attending: Emergency Medicine | Admitting: Emergency Medicine

## 2011-11-19 DIAGNOSIS — B9689 Other specified bacterial agents as the cause of diseases classified elsewhere: Secondary | ICD-10-CM | POA: Insufficient documentation

## 2011-11-19 DIAGNOSIS — N760 Acute vaginitis: Secondary | ICD-10-CM | POA: Insufficient documentation

## 2011-11-19 DIAGNOSIS — Z9079 Acquired absence of other genital organ(s): Secondary | ICD-10-CM | POA: Insufficient documentation

## 2011-11-19 DIAGNOSIS — A499 Bacterial infection, unspecified: Secondary | ICD-10-CM | POA: Insufficient documentation

## 2011-11-19 DIAGNOSIS — R109 Unspecified abdominal pain: Secondary | ICD-10-CM | POA: Insufficient documentation

## 2011-11-19 DIAGNOSIS — I10 Essential (primary) hypertension: Secondary | ICD-10-CM | POA: Insufficient documentation

## 2011-11-19 LAB — CBC WITH DIFFERENTIAL/PLATELET
Basophils Absolute: 0 10*3/uL (ref 0.0–0.1)
Basophils Relative: 0 % (ref 0–1)
Eosinophils Absolute: 0.1 10*3/uL (ref 0.0–0.7)
Eosinophils Relative: 2 % (ref 0–5)
HCT: 38.9 % (ref 36.0–46.0)
Hemoglobin: 13.2 g/dL (ref 12.0–15.0)
MCH: 29.7 pg (ref 26.0–34.0)
MCHC: 33.9 g/dL (ref 30.0–36.0)
MCV: 87.4 fL (ref 78.0–100.0)
Monocytes Absolute: 0.8 10*3/uL (ref 0.1–1.0)
Monocytes Relative: 13 % — ABNORMAL HIGH (ref 3–12)
Neutro Abs: 3.5 10*3/uL (ref 1.7–7.7)
RDW: 14.1 % (ref 11.5–15.5)

## 2011-11-19 LAB — URINALYSIS, ROUTINE W REFLEX MICROSCOPIC
Hgb urine dipstick: NEGATIVE
Ketones, ur: NEGATIVE mg/dL
Protein, ur: NEGATIVE mg/dL
Urobilinogen, UA: 0.2 mg/dL (ref 0.0–1.0)

## 2011-11-19 LAB — WET PREP, GENITAL: Trich, Wet Prep: NONE SEEN

## 2011-11-19 LAB — POCT I-STAT, CHEM 8
BUN: 6 mg/dL (ref 6–23)
Calcium, Ion: 1.19 mmol/L (ref 1.12–1.23)
Creatinine, Ser: 0.9 mg/dL (ref 0.50–1.10)
TCO2: 22 mmol/L (ref 0–100)

## 2011-11-19 LAB — PREGNANCY, URINE: Preg Test, Ur: NEGATIVE

## 2011-11-19 MED ORDER — SODIUM CHLORIDE 0.9 % IV BOLUS (SEPSIS)
1000.0000 mL | Freq: Once | INTRAVENOUS | Status: AC
  Administered 2011-11-19: 1000 mL via INTRAVENOUS

## 2011-11-19 MED ORDER — ONDANSETRON HCL 4 MG/2ML IJ SOLN
4.0000 mg | Freq: Once | INTRAMUSCULAR | Status: AC
  Administered 2011-11-19: 4 mg via INTRAVENOUS
  Filled 2011-11-19: qty 2

## 2011-11-19 MED ORDER — METRONIDAZOLE 500 MG PO TABS
500.0000 mg | ORAL_TABLET | Freq: Once | ORAL | Status: AC
  Administered 2011-11-19: 500 mg via ORAL
  Filled 2011-11-19: qty 1

## 2011-11-19 MED ORDER — LIDOCAINE HCL (PF) 1 % IJ SOLN
INTRAMUSCULAR | Status: AC
  Administered 2011-11-19: 5 mL
  Filled 2011-11-19: qty 5

## 2011-11-19 MED ORDER — AZITHROMYCIN 250 MG PO TABS
1000.0000 mg | ORAL_TABLET | Freq: Once | ORAL | Status: AC
  Administered 2011-11-19: 1000 mg via ORAL
  Filled 2011-11-19: qty 4

## 2011-11-19 MED ORDER — CEFTRIAXONE SODIUM 250 MG IJ SOLR
250.0000 mg | Freq: Once | INTRAMUSCULAR | Status: AC
  Administered 2011-11-19: 250 mg via INTRAMUSCULAR
  Filled 2011-11-19: qty 250

## 2011-11-19 MED ORDER — IOHEXOL 300 MG/ML  SOLN
36.0000 mL | Freq: Once | INTRAMUSCULAR | Status: AC | PRN
  Administered 2011-11-19: 36 mL via ORAL

## 2011-11-19 MED ORDER — METRONIDAZOLE 500 MG PO TABS: 500.0000 mg | ORAL_TABLET | Freq: Two times a day (BID) | ORAL | Status: AC

## 2011-11-19 MED ORDER — POTASSIUM CHLORIDE CRYS ER 20 MEQ PO TBCR
40.0000 meq | EXTENDED_RELEASE_TABLET | Freq: Once | ORAL | Status: AC
  Administered 2011-11-19: 40 meq via ORAL
  Filled 2011-11-19 (×2): qty 1

## 2011-11-19 MED ORDER — IOHEXOL 300 MG/ML  SOLN
100.0000 mL | Freq: Once | INTRAMUSCULAR | Status: AC | PRN
  Administered 2011-11-19: 100 mL via INTRAVENOUS

## 2011-11-19 NOTE — ED Notes (Signed)
Pt reports her period was due at the first of this month, and she is having generalized stomach cramping radiating to her back, also thick white discharge with fishy odor. Pt states she cannot be pregnant as she only has one ovary and that tube has been ligated. Denies any bleeding or spotting.

## 2011-11-19 NOTE — ED Provider Notes (Signed)
History     CSN: [id number]  Arrival date & time 11/19/11  7829   First MD Initiated Contact with Patient 11/19/11 1034      Chief Complaint  Patient presents with  . Abdominal Pain  . Nausea  . Amenorrhea    (Consider location/radiation/quality/duration/timing/severity/associated sxs/prior treatment) The history is provided by the patient.  Katherine Wiggins is a 46 y.o. female hx of fibromyalgia, HTN, depression, R oophrectomy here with generalized abdominal pain. Abdominal pain since yesterday, radiate to back. Some dysuria but no frequency. Also has white, foul smelling vaginal discharge. She is also late for her period (LMP 09/23/11). Denies being pregnant but sexually active with one partner and not using protection. Also has diffuse abdominal pain and feels nauseous when smelling food. + vomiting, no diarrhea or fevers. No hx of STDs. Also has R ear pain and sinus congestion.      Past Medical History  Diagnosis Date  . Fibromyalgia   . Hypertension   . Depression   . Asthma   . High cholesterol     Past Surgical History  Procedure Date  . Right oophorectomy   . Cervical spine surgery     History reviewed. No pertinent family history.  History  Substance Use Topics  . Smoking status: Never Smoker   . Smokeless tobacco: Not on file  . Alcohol Use: No    OB History    Grav Para Term Preterm Abortions TAB SAB Ect Mult Living                  Review of Systems  Gastrointestinal: Positive for nausea and vomiting.  Genitourinary: Positive for dysuria and vaginal discharge.  All other systems reviewed and are negative.    Allergies  Codeine; Macrodantin; and Septra  Home Medications   Current Outpatient Rx  Name Route Sig Dispense Refill  . CITALOPRAM HYDROBROMIDE 40 MG PO TABS Oral Take 40 mg by mouth daily.      Marland Kitchen DOXEPIN HCL 100 MG PO CAPS Oral Take 100 mg by mouth daily.      Marland Kitchen METOPROLOL SUCCINATE ER 50 MG PO TB24 Oral Take 50 mg by mouth daily.       Marland Kitchen SIMVASTATIN 40 MG PO TABS Oral Take 40 mg by mouth at bedtime.        BP 128/74  Pulse 72  Temp 98.6 F (37 C) (Oral)  Resp 20  Ht 5\' 4"  (1.626 m)  Wt 160 lb (72.576 kg)  BMI 27.46 kg/m2  SpO2 100%  LMP 09/23/2011  Physical Exam  Nursing note and vitals reviewed. Constitutional: She is oriented to person, place, and time. She appears well-developed and well-nourished.       uncomfortable  HENT:  Right Ear: External ear normal.  Left Ear: External ear normal.       MM slightly dry, bilateral TM nl  Eyes: Conjunctivae are normal. Pupils are equal, round, and reactive to light.  Neck: Normal range of motion. Neck supple.  Cardiovascular: Normal rate, regular rhythm and normal heart sounds.   Pulmonary/Chest: Effort normal and breath sounds normal.  Abdominal:       +BS, mild diffuse tenderness  Genitourinary:       + whitish discharge, slightly malodorous. Mild CMT, no ovarian or uterine tenderness.   Musculoskeletal: Normal range of motion.  Neurological: She is alert and oriented to person, place, and time.  Skin: Skin is warm and dry.  Psychiatric: She has a normal mood and  affect. Her behavior is normal. Judgment and thought content normal.    ED Course  Procedures (including critical care time)  Labs Reviewed  CBC WITH DIFFERENTIAL - Abnormal; Notable for the following:    Monocytes Relative 13 (*)     All other components within normal limits  WET PREP, GENITAL - Abnormal; Notable for the following:    Clue Cells Wet Prep HPF POC FEW (*)     WBC, Wet Prep HPF POC TOO NUMEROUS TO COUNT (*)     All other components within normal limits  POCT I-STAT, CHEM 8 - Abnormal; Notable for the following:    Potassium 3.3 (*)     Hemoglobin 16.3 (*)     HCT 48.0 (*)     All other components within normal limits  URINALYSIS, ROUTINE W REFLEX MICROSCOPIC  PREGNANCY, URINE  LIPASE, BLOOD  HCG, QUANTITATIVE, PREGNANCY  GC/CHLAMYDIA PROBE AMP, GENITAL   Ct Abdomen  Pelvis W Contrast  11/19/2011  *RADIOLOGY REPORT*  Clinical Data: Abdominal pain.  CT ABDOMEN AND PELVIS WITH CONTRAST  Technique:  Multidetector CT imaging of the abdomen and pelvis was performed following the standard protocol during bolus administration of intravenous contrast.  Contrast: 36mL OMNIPAQUE IOHEXOL 300 MG/ML  SOLN, OMNIPAQUE IOHEXOL 300 MG/ML  SOLN  Comparison: CT of the abdomen and pelvis 04/21/2008.  Findings:  Lung Bases: Unremarkable.  Abdomen/Pelvis:  Perfusion anomaly adjacent the falciform ligament (a common and benign finding) incidentally noted.  No other focal hepatic lesions are noted.  The enhanced appearance of the gallbladder, pancreas, spleen, bilateral adrenal glands and bilateral kidneys is unremarkable.  The patient is status post right oophorectomy.  Uterus and left ovary are unremarkable in appearance.  Normal appendix.  No ascites or pneumoperitoneum and no pathologic distension of bowel.  No definite pathologic lymphadenopathy identified within the abdomen or pelvis.  Urinary bladder is normal in appearance. Mild laxity of the anterior abdominal wall adjacent to the umbilicus, compatible with a small ventral hernia.  Musculoskeletal: There are no aggressive appearing lytic or blastic lesions noted in the visualized portions of the skeleton.  IMPRESSION: 1.  No acute findings in the abdomen or pelvis to account for the patient's symptoms. 2.  Normal appendix. 3.  Status post right oophorectomy. 4.  Small ventral hernia, without evidence of bowel incarceration or obstruction.   Original Report Authenticated By: Florencia Reasons, M.D.      1. Bacterial vaginosis   2. Abdominal pain       MDM  Katherine Wiggins is a 46 y.o. female here with diffuse abdominal pain, vaginal discharge. Will check labs, pregnancy test, UA, GC/chlamydia. Will give zofran, IVF and reassess.   2:20 PM Patient reassessed. Felt better, abdomen now soft and nontender and she tolerated  PO. Labs showed nl CBC, chem 8 showed K 3.3. UA nl, lipase nl, wet prep showed + clue cells. CT ab/pel showed no acute findings. She was treated for GC/chlamydia with ceftriaxone, azithro. She will be given a course of flagyl for BV.         Richardean Canal, MD 11/19/11 [phone]

## 2011-11-19 NOTE — ED Notes (Signed)
Patient feel like she is getting nausea and want some meds.

## 2011-12-27 ENCOUNTER — Emergency Department (HOSPITAL_BASED_OUTPATIENT_CLINIC_OR_DEPARTMENT_OTHER)
Admission: EM | Admit: 2011-12-27 | Discharge: 2011-12-27 | Disposition: A | Discharge status: Home / Self Care | Payer: Self-pay | Attending: Emergency Medicine | Admitting: Emergency Medicine

## 2011-12-27 ENCOUNTER — Encounter (HOSPITAL_BASED_OUTPATIENT_CLINIC_OR_DEPARTMENT_OTHER): Payer: Self-pay

## 2011-12-27 DIAGNOSIS — Z79899 Other long term (current) drug therapy: Secondary | ICD-10-CM | POA: Insufficient documentation

## 2011-12-27 DIAGNOSIS — Z882 Allergy status to sulfonamides status: Secondary | ICD-10-CM | POA: Insufficient documentation

## 2011-12-27 DIAGNOSIS — J45909 Unspecified asthma, uncomplicated: Secondary | ICD-10-CM | POA: Insufficient documentation

## 2011-12-27 DIAGNOSIS — I10 Essential (primary) hypertension: Secondary | ICD-10-CM | POA: Insufficient documentation

## 2011-12-27 DIAGNOSIS — E7800 Pure hypercholesterolemia, unspecified: Secondary | ICD-10-CM | POA: Insufficient documentation

## 2011-12-27 DIAGNOSIS — Z885 Allergy status to narcotic agent status: Secondary | ICD-10-CM | POA: Insufficient documentation

## 2011-12-27 DIAGNOSIS — N390 Urinary tract infection, site not specified: Secondary | ICD-10-CM | POA: Insufficient documentation

## 2011-12-27 DIAGNOSIS — F329 Major depressive disorder, single episode, unspecified: Secondary | ICD-10-CM | POA: Insufficient documentation

## 2011-12-27 DIAGNOSIS — F3289 Other specified depressive episodes: Secondary | ICD-10-CM | POA: Insufficient documentation

## 2011-12-27 LAB — URINALYSIS, ROUTINE W REFLEX MICROSCOPIC
Glucose, UA: NEGATIVE mg/dL
Ketones, ur: NEGATIVE mg/dL
Protein, ur: 30 mg/dL — AB

## 2011-12-27 LAB — URINE MICROSCOPIC-ADD ON

## 2011-12-27 LAB — PREGNANCY, URINE: Preg Test, Ur: NEGATIVE

## 2011-12-27 MED ORDER — CEPHALEXIN 250 MG PO CAPS
500.0000 mg | ORAL_CAPSULE | Freq: Once | ORAL | Status: AC
  Administered 2011-12-27: 500 mg via ORAL
  Filled 2011-12-27: qty 2

## 2011-12-27 MED ORDER — CEPHALEXIN 500 MG PO CAPS: 500.0000 mg | ORAL_CAPSULE | Freq: Four times a day (QID) | ORAL | Status: DC

## 2011-12-27 MED ORDER — ONDANSETRON 8 MG PO TBDP: 8.0000 mg | ORAL_TABLET | Freq: Three times a day (TID) | ORAL | Status: DC | PRN

## 2011-12-27 MED ORDER — OXYCODONE-ACETAMINOPHEN 5-325 MG PO TABS: 1.0000 | ORAL_TABLET | ORAL | Status: DC | PRN

## 2011-12-27 NOTE — ED Notes (Signed)
C/o right side lower back, flank pain, dysuria x 4 days

## 2011-12-27 NOTE — ED Provider Notes (Addendum)
History     CSN: [id number]  Arrival date & time 12/27/11  1842   First MD Initiated Contact with Patient 12/27/11 1928      Chief Complaint  Patient presents with  . Abdominal Pain    (Consider location/radiation/quality/duration/timing/severity/associated sxs/prior treatment) HPI  Patient presenting with complaints of 3-4 days of urinary frequency, pain with urination, bloody urine.  No fever, vomiting, or diarrhea.  History of utis but none for several years.  [facility]   Past Medical History  Diagnosis Date  . Fibromyalgia   . Hypertension   . Depression   . Asthma   . High cholesterol     Past Surgical History  Procedure Date  . Right oophorectomy   . Cervical spine surgery     No family history on file.  History  Substance Use Topics  . Smoking status: Never Smoker   . Smokeless tobacco: Not on file  . Alcohol Use: No    OB History    Grav Para Term Preterm Abortions TAB SAB Ect Mult Living                  Review of Systems  Constitutional: Negative for fever, chills, activity change, appetite change and unexpected weight change.  HENT: Negative for sore throat, rhinorrhea, neck pain, neck stiffness and sinus pressure.   Eyes: Negative for visual disturbance.  Respiratory: Negative for cough and shortness of breath.   Cardiovascular: Negative for chest pain and leg swelling.  Gastrointestinal: Negative for vomiting, abdominal pain, diarrhea and blood in stool.  Genitourinary: Positive for dysuria, urgency, frequency and flank pain. Negative for vaginal discharge and difficulty urinating.  Musculoskeletal: Negative for myalgias, arthralgias and gait problem.  Skin: Negative for color change and rash.  Neurological: Negative for weakness, light-headedness and headaches.  Hematological: Does not bruise/bleed easily.  Psychiatric/Behavioral: Negative for dysphoric mood.    Allergies  Codeine; Macrodantin; and Septra  Home  Medications   Current Outpatient Rx  Name Route Sig Dispense Refill  . CITALOPRAM HYDROBROMIDE 40 MG PO TABS Oral Take 40 mg by mouth daily.      Marland Kitchen DOXEPIN HCL 100 MG PO CAPS Oral Take 100 mg by mouth daily.      Marland Kitchen METOPROLOL SUCCINATE ER 50 MG PO TB24 Oral Take 50 mg by mouth daily.      Marland Kitchen SIMVASTATIN 40 MG PO TABS Oral Take 40 mg by mouth at bedtime.        BP 139/79  Pulse 66  Temp 98.5 F (36.9 C) (Oral)  Resp 16  Ht 5\' 3"  (1.6 m)  Wt 160 lb (72.576 kg)  BMI 28.34 kg/m2  SpO2 99%  LMP 10/24/2011  Physical Exam  Nursing note and vitals reviewed. Constitutional: She appears well-developed and well-nourished.  HENT:  Head: Normocephalic and atraumatic.  Eyes: Conjunctivae normal and EOM are normal. Pupils are equal, round, and reactive to light.  Neck: Normal range of motion. Neck supple.  Cardiovascular: Normal rate, regular rhythm, normal heart sounds and intact distal pulses.   Pulmonary/Chest: Effort normal and breath sounds normal.  Abdominal: Soft. Bowel sounds are normal.  Musculoskeletal: Normal range of motion.  Neurological: She is alert.  Skin: Skin is warm and dry.  Psychiatric: She has a normal mood and affect. Thought content normal.    ED Course  Procedures (including critical care time)  Labs Reviewed  URINALYSIS, ROUTINE W REFLEX MICROSCOPIC - Abnormal; Notable for the following:    APPearance CLOUDY (*)  Hgb urine dipstick LARGE (*)     Protein, ur 30 (*)     Leukocytes, UA MODERATE (*)     All other components within normal limits  URINE MICROSCOPIC-ADD ON - Abnormal; Notable for the following:    Squamous Epithelial / LPF FEW (*)     Bacteria, UA FEW (*)     All other components within normal limits  PREGNANCY, URINE   No results found.   No diagnosis found.    MDM  Patient with uti and will be treated with keflex.        Hilario Quarry, MD 12/27/11 6578  Hilario Quarry, MD 12/27/11 [phone]

## 2012-01-16 ENCOUNTER — Emergency Department (HOSPITAL_BASED_OUTPATIENT_CLINIC_OR_DEPARTMENT_OTHER)
Admission: EM | Admit: 2012-01-16 | Discharge: 2012-01-16 | Disposition: A | Discharge status: Home / Self Care | Payer: Self-pay | Attending: Emergency Medicine | Admitting: Emergency Medicine

## 2012-01-16 ENCOUNTER — Encounter (HOSPITAL_BASED_OUTPATIENT_CLINIC_OR_DEPARTMENT_OTHER): Payer: Self-pay | Admitting: *Deleted

## 2012-01-16 ENCOUNTER — Emergency Department (HOSPITAL_BASED_OUTPATIENT_CLINIC_OR_DEPARTMENT_OTHER): Payer: Self-pay

## 2012-01-16 DIAGNOSIS — R102 Pelvic and perineal pain: Secondary | ICD-10-CM

## 2012-01-16 DIAGNOSIS — Z9889 Other specified postprocedural states: Secondary | ICD-10-CM | POA: Insufficient documentation

## 2012-01-16 DIAGNOSIS — F3289 Other specified depressive episodes: Secondary | ICD-10-CM | POA: Insufficient documentation

## 2012-01-16 DIAGNOSIS — Z9079 Acquired absence of other genital organ(s): Secondary | ICD-10-CM | POA: Insufficient documentation

## 2012-01-16 DIAGNOSIS — R109 Unspecified abdominal pain: Secondary | ICD-10-CM | POA: Insufficient documentation

## 2012-01-16 DIAGNOSIS — N949 Unspecified condition associated with female genital organs and menstrual cycle: Secondary | ICD-10-CM | POA: Insufficient documentation

## 2012-01-16 DIAGNOSIS — F329 Major depressive disorder, single episode, unspecified: Secondary | ICD-10-CM | POA: Insufficient documentation

## 2012-01-16 DIAGNOSIS — Z79899 Other long term (current) drug therapy: Secondary | ICD-10-CM | POA: Insufficient documentation

## 2012-01-16 DIAGNOSIS — N760 Acute vaginitis: Secondary | ICD-10-CM | POA: Insufficient documentation

## 2012-01-16 DIAGNOSIS — I10 Essential (primary) hypertension: Secondary | ICD-10-CM | POA: Insufficient documentation

## 2012-01-16 DIAGNOSIS — Z8739 Personal history of other diseases of the musculoskeletal system and connective tissue: Secondary | ICD-10-CM | POA: Insufficient documentation

## 2012-01-16 DIAGNOSIS — R300 Dysuria: Secondary | ICD-10-CM | POA: Insufficient documentation

## 2012-01-16 DIAGNOSIS — Z792 Long term (current) use of antibiotics: Secondary | ICD-10-CM | POA: Insufficient documentation

## 2012-01-16 DIAGNOSIS — B9689 Other specified bacterial agents as the cause of diseases classified elsewhere: Secondary | ICD-10-CM

## 2012-01-16 DIAGNOSIS — J45909 Unspecified asthma, uncomplicated: Secondary | ICD-10-CM | POA: Insufficient documentation

## 2012-01-16 LAB — WET PREP, GENITAL
Trich, Wet Prep: NONE SEEN
Yeast Wet Prep HPF POC: NONE SEEN

## 2012-01-16 LAB — URINALYSIS, ROUTINE W REFLEX MICROSCOPIC
Bilirubin Urine: NEGATIVE
Glucose, UA: NEGATIVE mg/dL
Ketones, ur: NEGATIVE mg/dL
Leukocytes, UA: NEGATIVE
Protein, ur: NEGATIVE mg/dL

## 2012-01-16 LAB — PREGNANCY, URINE: Preg Test, Ur: NEGATIVE

## 2012-01-16 MED ORDER — METRONIDAZOLE 500 MG PO TABS: 500.0000 mg | ORAL_TABLET | Freq: Two times a day (BID) | ORAL | Status: DC

## 2012-01-16 NOTE — ED Provider Notes (Signed)
History     CSN: [id number]  Arrival date & time 01/16/12  0920   First MD Initiated Contact with Patient 01/16/12 [phone]      Chief Complaint  Patient presents with  . Urinary Tract Infection    (Consider location/radiation/quality/duration/timing/severity/associated sxs/prior treatment) HPI Comments: Patient was treated for suspected uti 3 weeks ago with keflex.  She seemed to get better but now symptoms are returning.  She is having frequency, burning, and lower abd and back pain.  No fevers or chills.  No bowel complaints.  LMP was three weeks ago and normal.  Patient is a 46 y.o. female presenting with urinary tract infection. The history is provided by the patient.  Urinary Tract Infection This is a recurrent problem. The current episode started 2 days ago. The problem occurs constantly. The problem has been gradually worsening. Associated symptoms include abdominal pain. Exacerbated by: urinating. Nothing relieves the symptoms.    Past Medical History  Diagnosis Date  . Fibromyalgia   . Hypertension   . Depression   . Asthma   . High cholesterol     Past Surgical History  Procedure Date  . Right oophorectomy   . Cervical spine surgery     No family history on file.  History  Substance Use Topics  . Smoking status: Never Smoker   . Smokeless tobacco: Not on file  . Alcohol Use: No    OB History    Grav Para Term Preterm Abortions TAB SAB Ect Mult Living                  Review of Systems  Gastrointestinal: Positive for abdominal pain.  All other systems reviewed and are negative.    Allergies  Codeine; Macrodantin; and Septra  Home Medications   Current Outpatient Rx  Name Route Sig Dispense Refill  . CEPHALEXIN 500 MG PO CAPS Oral Take 1 capsule (500 mg total) by mouth 4 (four) times daily. 28 capsule 0  . CITALOPRAM HYDROBROMIDE 40 MG PO TABS Oral Take 40 mg by mouth daily.      Marland Kitchen DOXEPIN HCL 100 MG PO CAPS Oral Take 100 mg by mouth daily.       Marland Kitchen METOPROLOL SUCCINATE ER 50 MG PO TB24 Oral Take 50 mg by mouth daily.      Marland Kitchen ONDANSETRON 8 MG PO TBDP Oral Take 1 tablet (8 mg total) by mouth every 8 (eight) hours as needed for nausea. 20 tablet 0  . OXYCODONE-ACETAMINOPHEN 5-325 MG PO TABS Oral Take 1 tablet by mouth every 4 (four) hours as needed for pain. 6 tablet 0  . SIMVASTATIN 40 MG PO TABS Oral Take 40 mg by mouth at bedtime.        BP 141/84  Pulse 74  Temp 98.3 F (36.8 C) (Oral)  Resp 20  SpO2 100%  LMP 12/26/2011  Physical Exam  Nursing note and vitals reviewed. Constitutional: She is oriented to person, place, and time. She appears well-developed and well-nourished. No distress.  HENT:  Head: Normocephalic and atraumatic.  Neck: Normal range of motion. Neck supple.  Cardiovascular: Normal rate and regular rhythm.  Exam reveals no gallop and no friction rub.   No murmur heard. Pulmonary/Chest: Effort normal and breath sounds normal. No respiratory distress. She has no wheezes.  Abdominal: Soft. Bowel sounds are normal. She exhibits no distension. There is no tenderness.  Genitourinary: Vagina normal and uterus normal. No vaginal discharge found.       There  is no cmt.  She does have ttp with bimanual exam bilaterally but no masses are palpable.  Musculoskeletal: Normal range of motion.  Neurological: She is alert and oriented to person, place, and time.  Skin: Skin is warm and dry. She is not diaphoretic.    ED Course  Procedures (including critical care time)   Labs Reviewed  PREGNANCY, URINE  URINALYSIS, ROUTINE W REFLEX MICROSCOPIC   No results found.   No diagnosis found.    MDM  The patient presents here with lower abd discomfort, urinary frequency she thought was due to a uti.  The urine did not reflect this and the pelvic exam was basically unremarkable.  The wet prep did show few clue cells and the patient tells me she has had BV several times in the past.  The ultrasound was essentially  unremarkable as well.  At this point, I will discharge her with flagyl and give time.  She will return if she worsens.    I doubt appendicitis and the presentation, exam, and workup were not suggestive of this.          Geoffery Lyons, MD 01/16/12 [phone]

## 2012-01-16 NOTE — ED Notes (Signed)
Pt returned from ultrasound and no change in pt condition.

## 2012-01-16 NOTE — ED Notes (Signed)
Patient states she was seen and treated for UTI approximately 3 weeks ago.  Has completed the medications, but continues to have burning and pain with urination and is associated with lower back pain.

## 2012-01-16 NOTE — ED Notes (Addendum)
Patient transported to Ultrasound ambulatory.  

## 2012-04-02 ENCOUNTER — Emergency Department (HOSPITAL_BASED_OUTPATIENT_CLINIC_OR_DEPARTMENT_OTHER)
Admission: EM | Admit: 2012-04-02 | Discharge: 2012-04-02 | Disposition: A | Discharge status: Home / Self Care | Payer: Self-pay | Attending: Emergency Medicine | Admitting: Emergency Medicine

## 2012-04-02 ENCOUNTER — Encounter (HOSPITAL_BASED_OUTPATIENT_CLINIC_OR_DEPARTMENT_OTHER): Payer: Self-pay | Admitting: *Deleted

## 2012-04-02 DIAGNOSIS — Z79899 Other long term (current) drug therapy: Secondary | ICD-10-CM | POA: Insufficient documentation

## 2012-04-02 DIAGNOSIS — E7800 Pure hypercholesterolemia, unspecified: Secondary | ICD-10-CM | POA: Insufficient documentation

## 2012-04-02 DIAGNOSIS — J45909 Unspecified asthma, uncomplicated: Secondary | ICD-10-CM | POA: Insufficient documentation

## 2012-04-02 DIAGNOSIS — F329 Major depressive disorder, single episode, unspecified: Secondary | ICD-10-CM | POA: Insufficient documentation

## 2012-04-02 DIAGNOSIS — I10 Essential (primary) hypertension: Secondary | ICD-10-CM | POA: Insufficient documentation

## 2012-04-02 DIAGNOSIS — Z8739 Personal history of other diseases of the musculoskeletal system and connective tissue: Secondary | ICD-10-CM | POA: Insufficient documentation

## 2012-04-02 DIAGNOSIS — G44209 Tension-type headache, unspecified, not intractable: Secondary | ICD-10-CM | POA: Insufficient documentation

## 2012-04-02 DIAGNOSIS — M436 Torticollis: Secondary | ICD-10-CM | POA: Insufficient documentation

## 2012-04-02 DIAGNOSIS — F3289 Other specified depressive episodes: Secondary | ICD-10-CM | POA: Insufficient documentation

## 2012-04-02 MED ORDER — CYCLOBENZAPRINE HCL 10 MG PO TABS: 10.0000 mg | ORAL_TABLET | Freq: Three times a day (TID) | ORAL | Status: DC | PRN

## 2012-04-02 MED ORDER — KETOROLAC TROMETHAMINE 60 MG/2ML IM SOLN
60.0000 mg | Freq: Once | INTRAMUSCULAR | Status: AC
  Administered 2012-04-02: 60 mg via INTRAMUSCULAR
  Filled 2012-04-02: qty 2

## 2012-04-02 MED ORDER — OXYCODONE-ACETAMINOPHEN 5-325 MG PO TABS: 1.0000 | ORAL_TABLET | Freq: Four times a day (QID) | ORAL | Status: DC | PRN

## 2012-04-02 MED ORDER — IBUPROFEN 800 MG PO TABS: 800.0000 mg | ORAL_TABLET | Freq: Three times a day (TID) | ORAL | Status: DC | PRN

## 2012-04-02 NOTE — ED Notes (Signed)
Neck pain that radiates up to right temple giving her a headache onset 2 weeks ago

## 2012-04-02 NOTE — ED Notes (Signed)
MD at bedside. 

## 2012-04-02 NOTE — ED Provider Notes (Signed)
History     CSN: [id number]  Arrival date & time 04/02/12  4098   First MD Initiated Contact with Patient 04/02/12 1003      Chief Complaint  Patient presents with  . Neck Pain    (Consider location/radiation/quality/duration/timing/severity/associated sxs/prior treatment) HPI Pt reports 2 week of gradually increasing R neck soreness/stiffness radiating up into her ear and face causing a throbbing headache. Improved some with ibuprofen. No known injury. No numbness or weakness in arms or hands.  Past Medical History  Diagnosis Date  . Fibromyalgia   . Hypertension   . Depression   . Asthma   . High cholesterol     Past Surgical History  Procedure Date  . Right oophorectomy   . Cervical spine surgery     History reviewed. No pertinent family history.  History  Substance Use Topics  . Smoking status: Never Smoker   . Smokeless tobacco: Not on file  . Alcohol Use: No    OB History    Grav Para Term Preterm Abortions TAB SAB Ect Mult Living                  Review of Systems All other systems reviewed and are negative except as noted in HPI.    Allergies  Codeine; Macrodantin; and Septra  Home Medications   Current Outpatient Rx  Name  Route  Sig  Dispense  Refill  . CEPHALEXIN 500 MG PO CAPS   Oral   Take 1 capsule (500 mg total) by mouth 4 (four) times daily.   28 capsule   0   . CITALOPRAM HYDROBROMIDE 40 MG PO TABS   Oral   Take 40 mg by mouth daily.           Marland Kitchen DOXEPIN HCL 100 MG PO CAPS   Oral   Take 100 mg by mouth daily.           Marland Kitchen METOPROLOL SUCCINATE ER 50 MG PO TB24   Oral   Take 50 mg by mouth daily.           Marland Kitchen METRONIDAZOLE 500 MG PO TABS   Oral   Take 1 tablet (500 mg total) by mouth 2 (two) times daily. One po bid x 7 days   14 tablet   0   . ONDANSETRON 8 MG PO TBDP   Oral   Take 1 tablet (8 mg total) by mouth every 8 (eight) hours as needed for nausea.   20 tablet   0   . OXYCODONE-ACETAMINOPHEN 5-325 MG PO  TABS   Oral   Take 1 tablet by mouth every 4 (four) hours as needed for pain.   6 tablet   0   . SIMVASTATIN 40 MG PO TABS   Oral   Take 40 mg by mouth at bedtime.             BP 141/89  Pulse 106  Temp 97.7 F (36.5 C) (Oral)  Resp 18  Ht 5\' 4"  (1.626 m)  Wt 200 lb (90.719 kg)  BMI 34.33 kg/m2  SpO2 99%  LMP 03/19/2012  Physical Exam  Nursing note and vitals reviewed. Constitutional: She is oriented to person, place, and time. She appears well-developed and well-nourished.  HENT:  Head: Normocephalic and atraumatic.  Eyes: EOM are normal. Pupils are equal, round, and reactive to light.  Neck: Normal range of motion. Neck supple.  Cardiovascular: Normal rate, normal heart sounds and intact distal pulses.  Pulmonary/Chest: Effort normal and breath sounds normal.  Abdominal: Bowel sounds are normal. She exhibits no distension. There is no tenderness.  Musculoskeletal: Normal range of motion. She exhibits tenderness (soft tissue tenderness over the L paraspinal and trapezius muscles of the neck). She exhibits no edema.  Neurological: She is alert and oriented to person, place, and time. She has normal strength. No cranial nerve deficit or sensory deficit.  Skin: Skin is warm and dry. No rash noted.  Psychiatric: She has a normal mood and affect.    ED Course  Procedures (including critical care time)  Labs Reviewed - No data to display No results found.   No diagnosis found.    MDM  Muscle spasm with tension headache. No radicular symptoms. No concern for acute surgical process.         Blain Hunsucker B. Bernette Mayers, MD 04/02/12 1419

## 2012-04-02 NOTE — ED Notes (Signed)
Pt denies any vision changes or nausea vomiting or dizziness with the pain ambulates with a steady gait

## 2012-11-02 ENCOUNTER — Emergency Department (HOSPITAL_BASED_OUTPATIENT_CLINIC_OR_DEPARTMENT_OTHER): Payer: Self-pay

## 2012-11-02 ENCOUNTER — Encounter (HOSPITAL_BASED_OUTPATIENT_CLINIC_OR_DEPARTMENT_OTHER): Payer: Self-pay | Admitting: *Deleted

## 2012-11-02 ENCOUNTER — Emergency Department (HOSPITAL_BASED_OUTPATIENT_CLINIC_OR_DEPARTMENT_OTHER)
Admission: EM | Admit: 2012-11-02 | Discharge: 2012-11-02 | Disposition: A | Discharge status: Home / Self Care | Payer: Self-pay | Attending: Emergency Medicine | Admitting: Emergency Medicine

## 2012-11-02 DIAGNOSIS — Z9851 Tubal ligation status: Secondary | ICD-10-CM | POA: Insufficient documentation

## 2012-11-02 DIAGNOSIS — N83209 Unspecified ovarian cyst, unspecified side: Secondary | ICD-10-CM | POA: Insufficient documentation

## 2012-11-02 DIAGNOSIS — I10 Essential (primary) hypertension: Secondary | ICD-10-CM | POA: Insufficient documentation

## 2012-11-02 DIAGNOSIS — N898 Other specified noninflammatory disorders of vagina: Secondary | ICD-10-CM | POA: Insufficient documentation

## 2012-11-02 DIAGNOSIS — F329 Major depressive disorder, single episode, unspecified: Secondary | ICD-10-CM | POA: Insufficient documentation

## 2012-11-02 DIAGNOSIS — R51 Headache: Secondary | ICD-10-CM | POA: Insufficient documentation

## 2012-11-02 DIAGNOSIS — R112 Nausea with vomiting, unspecified: Secondary | ICD-10-CM | POA: Insufficient documentation

## 2012-11-02 DIAGNOSIS — E7800 Pure hypercholesterolemia, unspecified: Secondary | ICD-10-CM | POA: Insufficient documentation

## 2012-11-02 DIAGNOSIS — J45909 Unspecified asthma, uncomplicated: Secondary | ICD-10-CM | POA: Insufficient documentation

## 2012-11-02 DIAGNOSIS — Z3202 Encounter for pregnancy test, result negative: Secondary | ICD-10-CM | POA: Insufficient documentation

## 2012-11-02 DIAGNOSIS — Z8739 Personal history of other diseases of the musculoskeletal system and connective tissue: Secondary | ICD-10-CM | POA: Insufficient documentation

## 2012-11-02 DIAGNOSIS — F3289 Other specified depressive episodes: Secondary | ICD-10-CM | POA: Insufficient documentation

## 2012-11-02 DIAGNOSIS — N83299 Other ovarian cyst, unspecified side: Secondary | ICD-10-CM

## 2012-11-02 DIAGNOSIS — Z9889 Other specified postprocedural states: Secondary | ICD-10-CM | POA: Insufficient documentation

## 2012-11-02 DIAGNOSIS — Z79899 Other long term (current) drug therapy: Secondary | ICD-10-CM | POA: Insufficient documentation

## 2012-11-02 LAB — CG4 I-STAT (LACTIC ACID): Lactic Acid, Venous: 1.73 mmol/L (ref 0.5–2.2)

## 2012-11-02 LAB — CBC WITH DIFFERENTIAL/PLATELET
Basophils Absolute: 0 10*3/uL (ref 0.0–0.1)
Basophils Relative: 1 % (ref 0–1)
Eosinophils Absolute: 0.1 10*3/uL (ref 0.0–0.7)
Eosinophils Relative: 2 % (ref 0–5)
HCT: 40.8 % (ref 36.0–46.0)
Hemoglobin: 13.6 g/dL (ref 12.0–15.0)
Lymphocytes Relative: 23 % (ref 12–46)
Lymphs Abs: 2 10*3/uL (ref 0.7–4.0)
MCH: 29.1 pg (ref 26.0–34.0)
MCHC: 33.3 g/dL (ref 30.0–36.0)
MCV: 87.2 fL (ref 78.0–100.0)
Monocytes Absolute: 0.7 10*3/uL (ref 0.1–1.0)
Monocytes Relative: 8 % (ref 3–12)
Neutro Abs: 5.8 10*3/uL (ref 1.7–7.7)
Neutrophils Relative %: 67 % (ref 43–77)
Platelets: 275 10*3/uL (ref 150–400)
RBC: 4.68 MIL/uL (ref 3.87–5.11)
RDW: 14.3 % (ref 11.5–15.5)
WBC: 8.7 10*3/uL (ref 4.0–10.5)

## 2012-11-02 LAB — COMPREHENSIVE METABOLIC PANEL
ALT: 31 U/L (ref 0–35)
AST: 25 U/L (ref 0–37)
Albumin: 4.3 g/dL (ref 3.5–5.2)
Alkaline Phosphatase: 75 U/L (ref 39–117)
BUN: 10 mg/dL (ref 6–23)
CO2: 26 mEq/L (ref 19–32)
Calcium: 10.4 mg/dL (ref 8.4–10.5)
Chloride: 102 mEq/L (ref 96–112)
Creatinine, Ser: 0.7 mg/dL (ref 0.50–1.10)
GFR calc Af Amer: >90 mL/min (ref >90)
GFR calc non Af Amer: >90 mL/min (ref >90)
Glucose, Bld: 97 mg/dL (ref 70–99)
Potassium: 3.7 mEq/L (ref 3.5–5.1)
Sodium: 141 mEq/L (ref 135–145)
Total Bilirubin: 0.2 mg/dL — ABNORMAL LOW (ref 0.3–1.2)
Total Protein: 8.9 g/dL — ABNORMAL HIGH (ref 6.0–8.3)

## 2012-11-02 LAB — URINALYSIS, ROUTINE W REFLEX MICROSCOPIC
Glucose, UA: NEGATIVE mg/dL
Ketones, ur: NEGATIVE mg/dL
Leukocytes, UA: NEGATIVE
Nitrite: NEGATIVE
Protein, ur: NEGATIVE mg/dL
Urobilinogen, UA: 0.2 mg/dL (ref 0.0–1.0)

## 2012-11-02 LAB — LIPASE, BLOOD: Lipase: 17 U/L (ref 11–59)

## 2012-11-02 LAB — WET PREP, GENITAL
Trich, Wet Prep: NONE SEEN
Yeast Wet Prep HPF POC: NONE SEEN

## 2012-11-02 LAB — PREGNANCY, URINE: Preg Test, Ur: NEGATIVE

## 2012-11-02 MED ORDER — KETOROLAC TROMETHAMINE 30 MG/ML IJ SOLN
30.0000 mg | Freq: Once | INTRAMUSCULAR | Status: AC
  Administered 2012-11-02: 30 mg via INTRAVENOUS
  Filled 2012-11-02: qty 1

## 2012-11-02 MED ORDER — MORPHINE SULFATE 4 MG/ML IJ SOLN
4.0000 mg | Freq: Once | INTRAMUSCULAR | Status: AC
  Administered 2012-11-02: 4 mg via INTRAVENOUS
  Filled 2012-11-02: qty 1

## 2012-11-02 MED ORDER — ONDANSETRON HCL 4 MG/2ML IJ SOLN
4.0000 mg | Freq: Once | INTRAMUSCULAR | Status: AC
  Administered 2012-11-02: 4 mg via INTRAVENOUS
  Filled 2012-11-02: qty 2

## 2012-11-02 MED ORDER — IOHEXOL 300 MG/ML  SOLN
50.0000 mL | Freq: Once | INTRAMUSCULAR | Status: AC | PRN
  Administered 2012-11-02: 50 mL via ORAL

## 2012-11-02 MED ORDER — METRONIDAZOLE 500 MG PO TABS: 500.0000 mg | ORAL_TABLET | Freq: Two times a day (BID) | ORAL | Status: DC

## 2012-11-02 MED ORDER — ONDANSETRON HCL 4 MG/2ML IJ SOLN
4.0000 mg | Freq: Once | INTRAMUSCULAR | Status: AC
  Administered 2012-11-02: 4 mg via INTRAVENOUS

## 2012-11-02 MED ORDER — HYDROCODONE-ACETAMINOPHEN 5-325 MG PO TABS: 1.0000 | ORAL_TABLET | Freq: Four times a day (QID) | ORAL | Status: DC | PRN

## 2012-11-02 MED ORDER — IOHEXOL 300 MG/ML  SOLN
100.0000 mL | Freq: Once | INTRAMUSCULAR | Status: AC | PRN
  Administered 2012-11-02: 100 mL via INTRAVENOUS

## 2012-11-02 MED ORDER — ONDANSETRON HCL 4 MG/2ML IJ SOLN
INTRAMUSCULAR | Status: AC
  Administered 2012-11-02: 4 mg via INTRAVENOUS
  Filled 2012-11-02: qty 2

## 2012-11-02 MED ORDER — SODIUM CHLORIDE 0.9 % IV BOLUS (SEPSIS)
1000.0000 mL | Freq: Once | INTRAVENOUS | Status: AC
  Administered 2012-11-02: 1000 mL via INTRAVENOUS

## 2012-11-02 MED ORDER — PROMETHAZINE HCL 25 MG PO TABS: 25.0000 mg | ORAL_TABLET | Freq: Four times a day (QID) | ORAL | Status: DC | PRN

## 2012-11-02 NOTE — ED Notes (Signed)
MD at bedside. 

## 2012-11-02 NOTE — ED Notes (Addendum)
Pt c/o diffuse abd pain and nausea x 1 week . LMP : 7/03  Pt also reports vaginal discharge

## 2012-11-02 NOTE — ED Provider Notes (Signed)
CSN: [id number]     Arrival date & time 11/02/12  1543 History     First MD Initiated Contact with Patient 11/02/12 1600     Chief Complaint  Patient presents with  . Abdominal Pain   (Consider location/radiation/quality/duration/timing/severity/associated sxs/prior Treatment) HPI Patient presents to the emergency department with abdominal pain, has been ongoing for the last week.  Patient, states, that she has diffuse abdominal pain, nausea, vomiting, and headache.  Patient denies fever, chest pain, shortness of breath, weakness, numbness, dizziness, blurred vision, neck pain, dysuria, rash or syncope.  Patient, states, that he has diffuse, abdominal pain.  She does have mostly lower abdominal pain, but painful everywhere.  She states she does have vaginal discharge, as well Past Medical History  Diagnosis Date  . Fibromyalgia   . Hypertension   . Depression   . Asthma   . High cholesterol    Past Surgical History  Procedure Laterality Date  . Right oophorectomy    . Cervical spine surgery    . Tubal ligation     History reviewed. No pertinent family history. History  Substance Use Topics  . Smoking status: Never Smoker   . Smokeless tobacco: Not on file  . Alcohol Use: No   OB History   Grav Para Term Preterm Abortions TAB SAB Ect Mult Living                 Review of Systems All other systems negative except as documented in the HPI. All pertinent positives and negatives as reviewed in the HPI.  Allergies  Codeine; Macrodantin; and Septra  Home Medications   Current Outpatient Rx  Name  Route  Sig  Dispense  Refill  . citalopram (CELEXA) 40 MG tablet   Oral   Take 40 mg by mouth daily.           Marland Kitchen doxepin (SINEQUAN) 100 MG capsule   Oral   Take 100 mg by mouth daily.           Marland Kitchen ibuprofen (ADVIL,MOTRIN) 800 MG tablet   Oral   Take 1 tablet (800 mg total) by mouth every 8 (eight) hours as needed for pain.   30 tablet   0   . metoprolol (TOPROL-XL)  50 MG 24 hr tablet   Oral   Take 50 mg by mouth daily.           . simvastatin (ZOCOR) 40 MG tablet   Oral   Take 40 mg by mouth at bedtime.            BP 141/93  Pulse 86  Temp(Src) 98.2 F (36.8 C) (Oral)  Resp 16  Ht 5\' 4"  (1.626 m)  SpO2 100%  LMP 09/24/2012 Physical Exam  Nursing note and vitals reviewed. Constitutional: She is oriented to person, place, and time. She appears well-developed and well-nourished. No distress.  HENT:  Head: Normocephalic and atraumatic.  Mouth/Throat: Oropharynx is clear and moist.  Eyes: Pupils are equal, round, and reactive to light.  Neck: Normal range of motion. Neck supple.  Cardiovascular: Normal rate and regular rhythm.  Exam reveals no gallop and no friction rub.   No murmur heard. Pulmonary/Chest: Effort normal and breath sounds normal. No respiratory distress.  Abdominal: Soft. Bowel sounds are normal. She exhibits no distension. There is tenderness. There is no rebound.  Genitourinary: Cervix exhibits discharge. Right adnexum displays tenderness. Left adnexum displays tenderness. There is tenderness around the vagina. Vaginal discharge found.  Neurological: She  is alert and oriented to person, place, and time.  Skin: Skin is warm and dry. No rash noted.    ED Course   Procedures (including critical care time)  Labs Reviewed  PREGNANCY, URINE  URINALYSIS, ROUTINE W REFLEX MICROSCOPIC  CBC WITH DIFFERENTIAL  COMPREHENSIVE METABOLIC PANEL  LIPASE, BLOOD    The patient is referred to GYN for further work up. Told to return here as needed. The patient has been stable here in the ER. The patients pain has improved with medication. MDM  MDM Reviewed: nursing note, vitals and previous chart Interpretation: labs      Carlyle Dolly, PA-C 11/03/12 0105

## 2012-11-03 LAB — GC/CHLAMYDIA PROBE AMP
CT Probe RNA: NEGATIVE
GC Probe RNA: NEGATIVE

## 2012-11-03 NOTE — ED Provider Notes (Signed)
Medical screening examination/treatment/procedure(s) were performed by non-physician practitioner and as supervising physician I was immediately available for consultation/collaboration.   Glynn Octave, MD 11/03/12 [phone]

## 2012-11-04 ENCOUNTER — Inpatient Hospital Stay (HOSPITAL_COMMUNITY)
Admission: AD | Admit: 2012-11-04 | Discharge: 2012-11-04 | Disposition: A | Discharge status: Home / Self Care | Payer: Self-pay | Source: Ambulatory Visit | Attending: Obstetrics & Gynecology | Admitting: Obstetrics & Gynecology

## 2012-11-04 ENCOUNTER — Encounter (HOSPITAL_COMMUNITY): Payer: Self-pay | Admitting: *Deleted

## 2012-11-04 DIAGNOSIS — N83209 Unspecified ovarian cyst, unspecified side: Secondary | ICD-10-CM | POA: Insufficient documentation

## 2012-11-04 DIAGNOSIS — R109 Unspecified abdominal pain: Secondary | ICD-10-CM | POA: Insufficient documentation

## 2012-11-04 DIAGNOSIS — A499 Bacterial infection, unspecified: Secondary | ICD-10-CM | POA: Insufficient documentation

## 2012-11-04 DIAGNOSIS — N760 Acute vaginitis: Secondary | ICD-10-CM | POA: Insufficient documentation

## 2012-11-04 DIAGNOSIS — B9689 Other specified bacterial agents as the cause of diseases classified elsewhere: Secondary | ICD-10-CM | POA: Insufficient documentation

## 2012-11-04 LAB — CBC WITH DIFFERENTIAL/PLATELET
Basophils Relative: 1 % (ref 0–1)
Eosinophils Absolute: 0.1 10*3/uL (ref 0.0–0.7)
Eosinophils Relative: 1 % (ref 0–5)
MCH: 28.9 pg (ref 26.0–34.0)
MCHC: 33.2 g/dL (ref 30.0–36.0)
MCV: 86.9 fL (ref 78.0–100.0)
Neutrophils Relative %: 61 % (ref 43–77)
Platelets: 295 10*3/uL (ref 150–400)
RDW: 14.3 % (ref 11.5–15.5)

## 2012-11-04 LAB — URINALYSIS, ROUTINE W REFLEX MICROSCOPIC
Bilirubin Urine: NEGATIVE
Ketones, ur: NEGATIVE mg/dL
Nitrite: NEGATIVE
Protein, ur: NEGATIVE mg/dL
Specific Gravity, Urine: 1.02 (ref 1.005–1.030)
Urobilinogen, UA: 0.2 mg/dL (ref 0.0–1.0)

## 2012-11-04 LAB — URINE MICROSCOPIC-ADD ON

## 2012-11-04 MED ORDER — ONDANSETRON 8 MG PO TBDP
8.0000 mg | ORAL_TABLET | Freq: Once | ORAL | Status: AC
  Administered 2012-11-04: 8 mg via ORAL
  Filled 2012-11-04: qty 1

## 2012-11-04 MED ORDER — KETOROLAC TROMETHAMINE 60 MG/2ML IM SOLN
60.0000 mg | Freq: Once | INTRAMUSCULAR | Status: AC
  Administered 2012-11-04: 60 mg via INTRAMUSCULAR
  Filled 2012-11-04: qty 2

## 2012-11-04 NOTE — MAU Note (Signed)
Patient states she was seen at [facility] on 8-11 and told she had an ovarian cyst. Patient states she continues to have abdominal pain, vaginal discharge with foul odor, headache, nausea. States pain on the left side worse and radiates to left back. Has seen blood in the urine.

## 2012-11-04 NOTE — MAU Note (Signed)
Pt states she was seen at the ER on 68. Pt states she was told that she had BV. Pt states pain has  Increased. Pt states she has been taking medication x1 day

## 2012-11-04 NOTE — Progress Notes (Signed)
Pt states she last took pain medication last night

## 2012-11-04 NOTE — MAU Provider Note (Signed)
History     CSN: [id number]  Arrival date and time: 11/04/12 1238   None     Chief Complaint  Patient presents with  . Abdominal Pain  . Nausea  . Headache   HPI Katherine Wiggins is 47 y.o.  presenting with blood in her urine and cysts.  She was seen at [facility] on 8/11.  Dx with hemorrhagic cyst and BV. Rx for Flagyl, med for nausea and pain med. GC/CHL cultures were negative.  U/S and CT showed cyst.   Last pain med was last night.  She was instructed to follow up with GYN MD.   She states the pain became progressively worse and that the pain did not feel the same as it did when she had BV in the past.     Past Medical History  Diagnosis Date  . Fibromyalgia   . Hypertension   . Depression   . Asthma   . High cholesterol     Past Surgical History  Procedure Laterality Date  . Right oophorectomy    . Cervical spine surgery    . Tubal ligation      No family history on file.  History  Substance Use Topics  . Smoking status: Never Smoker   . Smokeless tobacco: Not on file  . Alcohol Use: No    Allergies:  Allergies  Allergen Reactions  . Codeine Hives    Vision becomes blurry   . Macrodantin [Nitrofurantoin] Hives    Vision becomes blurry   . Septra [Sulfamethoxazole W/Trimethoprim (Co-Trimoxazole)] Hives    Vision becomes blurry     Prescriptions prior to admission  Medication Sig Dispense Refill  . acetaminophen (TYLENOL) 500 MG tablet Take 1,000 mg by mouth every 6 (six) hours as needed for pain (For headache.).      Marland Kitchen citalopram (CELEXA) 40 MG tablet Take 40 mg by mouth daily.        Marland Kitchen doxepin (SINEQUAN) 100 MG capsule Take 100 mg by mouth daily.        Marland Kitchen HYDROcodone-acetaminophen (NORCO/VICODIN) 5-325 MG per tablet Take 1 tablet by mouth every 6 (six) hours as needed for pain.  15 tablet  0  . metoprolol (TOPROL-XL) 50 MG 24 hr tablet Take 50 mg by mouth daily.        . metroNIDAZOLE (FLAGYL) 500 MG tablet Take 1 tablet (500 mg total) by  mouth 2 (two) times daily.  14 tablet  0  . promethazine (PHENERGAN) 25 MG tablet Take 1 tablet (25 mg total) by mouth every 6 (six) hours as needed for nausea.  12 tablet  0  . simvastatin (ZOCOR) 40 MG tablet Take 40 mg by mouth at bedtime.          Review of Systems  Constitutional: Negative for fever and chills.  Gastrointestinal: Positive for nausea and abdominal pain. Negative for vomiting.  Genitourinary:       Neg for vaginal bleeding.  Had vaginal d/c-dx with BV 8/11  Neurological: Negative for headaches.   Physical Exam   Blood pressure 144/88, pulse 79, temperature 98.6 F (37 C), temperature source Oral, resp. rate 16, last menstrual period 09/24/2012, SpO2 100.00%.  Physical Exam  Constitutional: She is oriented to person, place, and time. She appears well-developed and well-nourished. No distress.  HENT:  Head: Normocephalic.  GI: There is tenderness (mild lower abdominal pain).  Neurological: She is alert and oriented to person, place, and time.  Skin: Skin is warm and  dry.  Psychiatric: She has a normal mood and affect. Her behavior is normal.    Results for orders placed during the hospital encounter of 11/04/12 (from the past 24 hour(s))  URINALYSIS, ROUTINE W REFLEX MICROSCOPIC     Status: Abnormal   Collection Time    11/04/12  1:30 PM      Result Value Range   Color, Urine YELLOW  YELLOW   APPearance CLEAR  CLEAR   Specific Gravity, Urine 1.020  1.005 - 1.030   pH 6.5  5.0 - 8.0   Glucose, UA NEGATIVE  NEGATIVE mg/dL   Hgb urine dipstick NEGATIVE  NEGATIVE   Bilirubin Urine NEGATIVE  NEGATIVE   Ketones, ur NEGATIVE  NEGATIVE mg/dL   Protein, ur NEGATIVE  NEGATIVE mg/dL   Urobilinogen, UA 0.2  0.0 - 1.0 mg/dL   Nitrite NEGATIVE  NEGATIVE   Leukocytes, UA SMALL (*) NEGATIVE  URINE MICROSCOPIC-ADD ON     Status: Abnormal   Collection Time    11/04/12  1:30 PM      Result Value Range   Squamous Epithelial / LPF FEW (*) RARE   WBC, UA 3-6  <3 WBC/hpf    RBC / HPF 0-2  <3 RBC/hpf   Bacteria, UA MANY (*) RARE   Urine-Other MUCOUS PRESENT    CBC WITH DIFFERENTIAL     Status: None   Collection Time    11/04/12  6:30 PM      Result Value Range   WBC 7.3  4.0 - 10.5 K/uL   RBC 4.29  3.87 - 5.11 MIL/uL   Hemoglobin 12.4  12.0 - 15.0 g/dL   HCT 78.2  95.6 - 21.3 %   MCV 86.9  78.0 - 100.0 fL   MCH 28.9  26.0 - 34.0 pg   MCHC 33.2  30.0 - 36.0 g/dL   RDW 08.6  57.8 - 46.9 %   Platelets 295  150 - 400 K/uL   Neutrophils Relative % 61  43 - 77 %   Neutro Abs 4.5  1.7 - 7.7 K/uL   Lymphocytes Relative 26  12 - 46 %   Lymphs Abs 1.9  0.7 - 4.0 K/uL   Monocytes Relative 11  3 - 12 %   Monocytes Absolute 0.8  0.1 - 1.0 K/uL   Eosinophils Relative 1  0 - 5 %   Eosinophils Absolute 0.1  0.0 - 0.7 K/uL   Basophils Relative 1  0 - 1 %   Basophils Absolute 0.0  0.0 - 0.1 K/uL   MAU Course  Procedures  MDM Toradol 60mg  IM given for pain. Discussed the importance of taking antibiotic as Rx and to take pain medication ahead of severe pain to avoid difficulty treating pain at this level;.  Assessment and Plan  A:  Abdominal pain      Known hemorrhagic cyst U/S 11/02/12     Bacterial vaginosis-being treated  P:  Continue Flagyl until completed      May take ibuprofen for pain prn--begin tomorrow      Follow up with MD of her choice  KEY,EVE M 11/04/2012, 4:45 PM

## 2012-11-05 LAB — URINE CULTURE: Colony Count: 40000

## 2012-11-05 NOTE — MAU Provider Note (Signed)
Attestation of Attending Supervision of Advanced Practitioner (PA/CNM/NP): Evaluation and management procedures were performed by the Advanced Practitioner under my supervision and collaboration.  I have reviewed the Advanced Practitioner's note and chart, and I agree with the management and plan.  Lekita Kerekes, MD, FACOG Attending Obstetrician & Gynecologist Faculty Practice, [facility]  

## 2012-12-14 ENCOUNTER — Encounter: Payer: 59 | Admitting: Obstetrics & Gynecology

## 2013-05-30 ENCOUNTER — Emergency Department (HOSPITAL_BASED_OUTPATIENT_CLINIC_OR_DEPARTMENT_OTHER)
Admission: EM | Admit: 2013-05-30 | Discharge: 2013-05-30 | Disposition: A | Discharge status: Home / Self Care | Payer: 59 | Attending: Emergency Medicine | Admitting: Emergency Medicine

## 2013-05-30 ENCOUNTER — Encounter (HOSPITAL_BASED_OUTPATIENT_CLINIC_OR_DEPARTMENT_OTHER): Payer: Self-pay | Admitting: Emergency Medicine

## 2013-05-30 DIAGNOSIS — I10 Essential (primary) hypertension: Secondary | ICD-10-CM | POA: Insufficient documentation

## 2013-05-30 DIAGNOSIS — J45909 Unspecified asthma, uncomplicated: Secondary | ICD-10-CM | POA: Insufficient documentation

## 2013-05-30 DIAGNOSIS — IMO0001 Reserved for inherently not codable concepts without codable children: Secondary | ICD-10-CM | POA: Insufficient documentation

## 2013-05-30 DIAGNOSIS — F3289 Other specified depressive episodes: Secondary | ICD-10-CM | POA: Insufficient documentation

## 2013-05-30 DIAGNOSIS — M2550 Pain in unspecified joint: Secondary | ICD-10-CM | POA: Insufficient documentation

## 2013-05-30 DIAGNOSIS — F329 Major depressive disorder, single episode, unspecified: Secondary | ICD-10-CM | POA: Insufficient documentation

## 2013-05-30 DIAGNOSIS — Z79899 Other long term (current) drug therapy: Secondary | ICD-10-CM | POA: Insufficient documentation

## 2013-05-30 DIAGNOSIS — Z792 Long term (current) use of antibiotics: Secondary | ICD-10-CM | POA: Insufficient documentation

## 2013-05-30 DIAGNOSIS — L02519 Cutaneous abscess of unspecified hand: Secondary | ICD-10-CM | POA: Insufficient documentation

## 2013-05-30 DIAGNOSIS — L03119 Cellulitis of unspecified part of limb: Secondary | ICD-10-CM

## 2013-05-30 DIAGNOSIS — E7800 Pure hypercholesterolemia, unspecified: Secondary | ICD-10-CM | POA: Insufficient documentation

## 2013-05-30 DIAGNOSIS — J069 Acute upper respiratory infection, unspecified: Secondary | ICD-10-CM | POA: Insufficient documentation

## 2013-05-30 MED ORDER — FEXOFENADINE HCL 60 MG PO TABS: 60.0000 mg | ORAL_TABLET | Freq: Two times a day (BID) | ORAL | Status: DC

## 2013-05-30 MED ORDER — CLINDAMYCIN HCL 300 MG PO CAPS: 300.0000 mg | ORAL_CAPSULE | Freq: Four times a day (QID) | ORAL | Status: DC

## 2013-05-30 NOTE — ED Provider Notes (Signed)
CSN: [id number]     Arrival date & time 05/30/13  1043 History   First MD Initiated Contact with Patient 05/30/13 1137     Chief Complaint  Patient presents with  . Hand Pain  . Nasal Congestion     (Consider location/radiation/quality/duration/timing/severity/associated sxs/prior Treatment) HPI Comments: Patient presents with pain and swelling to her left hand that started yesterday. She noticed a little last night and then noticed that it increased throughout today. She's had some swelling and pain to the dorsum of her left hand. The pain radiates a little toward her wrist. She denies any fevers or chills. She denies a past history of skin infections. She denies any injuries or wounds to the hand. She also states that she's had some cold symptoms that started yesterday as well. She has a runny nose and nasal congestion. She denies any chest congestion. She denies any shortness of breath. She denies any sore throat or vomiting.  Patient is a 48 y.o. female presenting with hand pain.  Hand Pain Pertinent negatives include no chest pain, no abdominal pain, no headaches and no shortness of breath.    Past Medical History  Diagnosis Date  . Fibromyalgia   . Hypertension   . Depression   . Asthma   . High cholesterol    Past Surgical History  Procedure Laterality Date  . Right oophorectomy    . Cervical spine surgery    . Tubal ligation     Family History  Problem Relation Age of Onset  . Diabetes Mother   . Hypertension Mother   . Diabetes Sister   . Hypertension Sister   . Hypertension Brother    History  Substance Use Topics  . Smoking status: Never Smoker   . Smokeless tobacco: Not on file  . Alcohol Use: No   OB History   Grav Para Term Preterm Abortions TAB SAB Ect Mult Living   5 3 3       3      Review of Systems  Constitutional: Negative for fever, chills, diaphoresis and fatigue.  HENT: Positive for congestion, postnasal drip and rhinorrhea. Negative for  sneezing.   Eyes: Negative.   Respiratory: Negative for cough, chest tightness and shortness of breath.   Cardiovascular: Negative for chest pain and leg swelling.  Gastrointestinal: Negative for nausea, vomiting, abdominal pain, diarrhea and blood in stool.  Genitourinary: Negative for frequency, hematuria, flank pain and difficulty urinating.  Musculoskeletal: Positive for arthralgias. Negative for back pain.  Skin: Positive for color change. Negative for rash.  Neurological: Negative for dizziness, speech difficulty, weakness, numbness and headaches.      Allergies  Codeine; Macrodantin; and Septra  Home Medications   Current Outpatient Rx  Name  Route  Sig  Dispense  Refill  . citalopram (CELEXA) 40 MG tablet   Oral   Take 40 mg by mouth daily.           . clindamycin (CLEOCIN) 300 MG capsule   Oral   Take 1 capsule (300 mg total) by mouth 4 (four) times daily. X 7 days   28 capsule   0   . doxepin (SINEQUAN) 100 MG capsule   Oral   Take 100 mg by mouth daily.           . fexofenadine (ALLEGRA) 60 MG tablet   Oral   Take 1 tablet (60 mg total) by mouth 2 (two) times daily.   30 tablet   0   .  metoprolol (TOPROL-XL) 50 MG 24 hr tablet   Oral   Take 50 mg by mouth daily.           . simvastatin (ZOCOR) 40 MG tablet   Oral   Take 40 mg by mouth at bedtime.            BP 124/77  Pulse 72  Temp(Src) 98.2 F (36.8 C) (Oral)  Resp 18  SpO2 99% Physical Exam  Constitutional: She is oriented to person, place, and time. She appears well-developed and well-nourished.  HENT:  Head: Normocephalic and atraumatic.  Right Ear: External ear normal.  Left Ear: External ear normal.  Mouth/Throat: Oropharynx is clear and moist.  Eyes: Pupils are equal, round, and reactive to light.  Neck: Normal range of motion. Neck supple.  Cardiovascular: Normal rate, regular rhythm and normal heart sounds.   Pulmonary/Chest: Effort normal and breath sounds normal. No  respiratory distress. She has no wheezes. She has no rales. She exhibits no tenderness.  Abdominal: Soft. Bowel sounds are normal. There is no tenderness. There is no rebound and no guarding.  Musculoskeletal: Normal range of motion. She exhibits no edema.  Patient has about a 3 cm area of erythema to the dorsum of the left hand. There is no extension to the wrist or forearm. There some mild swelling to this area. It's tender on palpation but there is no induration or fluctuance. There is no pain to the volar surface of the hand. There is no significant worsening of pain with range of motion of the fingers that would be more consistent with a tenosynovitis.  Lymphadenopathy:    She has no cervical adenopathy.  Neurological: She is alert and oriented to person, place, and time.  Skin: Skin is warm and dry. No rash noted.  Psychiatric: She has a normal mood and affect.    ED Course  Procedures (including critical care time) Labs Review Labs Reviewed - No data to display Imaging Review No results found.   EKG Interpretation None      MDM   Final diagnoses:  Cellulitis of hand  URI (upper respiratory infection)    Patient presents with a hand infection that feels consistent with a cellulitis. The area is fairly small and did not extend past the wrist. There is no palpable abscess. I started her on antibiotics and encouraged her return in 24 hours for wound check. I also did give her referral to a hand surgeon if she can get in there tomorrow for recheck instead. I advised to return sooner if she has any worsening symptoms. She's given prescriptions for Allegra for her URI symptoms.    Malvin Johns, MD 05/30/13 1515

## 2013-05-30 NOTE — ED Notes (Signed)
Patient here with congestion and left hand pain and swelling with redness since last pm. No known bite or trauma to hand, swelling noted to anterior hand

## 2013-05-30 NOTE — Discharge Instructions (Signed)
Cellulitis Cellulitis is an infection of the skin and the tissue beneath it. The infected area is usually red and tender. Cellulitis occurs most often in the arms and lower legs.  CAUSES  Cellulitis is caused by bacteria that enter the skin through cracks or cuts in the skin. The most common types of bacteria that cause cellulitis are Staphylococcus and Streptococcus. SYMPTOMS   Redness and warmth.  Swelling.  Tenderness or pain.  Fever. DIAGNOSIS  Your caregiver can usually determine what is wrong based on a physical exam. Blood tests may also be done. TREATMENT  Treatment usually involves taking an antibiotic medicine. HOME CARE INSTRUCTIONS   Take your antibiotics as directed. Finish them even if you start to feel better.  Keep the infected arm or leg elevated to reduce swelling.  Apply a warm cloth to the affected area up to 4 times per day to relieve pain.  Only take over-the-counter or prescription medicines for pain, discomfort, or fever as directed by your caregiver.  Keep all follow-up appointments as directed by your caregiver. SEEK MEDICAL CARE IF:   You notice red streaks coming from the infected area.  Your red area gets larger or turns dark in color.  Your bone or joint underneath the infected area becomes painful after the skin has healed.  Your infection returns in the same area or another area.  You notice a swollen bump in the infected area.  You develop new symptoms. SEEK IMMEDIATE MEDICAL CARE IF:   You have a fever.  You feel very sleepy.  You develop vomiting or diarrhea.  You have a general ill feeling (malaise) with muscle aches and pains. MAKE SURE YOU:   Understand these instructions.  Will watch your condition.  Will get help right away if you are not doing well or get worse. Document Released: 12/19/2004 Document Revised: 09/10/2011 Document Reviewed: 05/27/2011 [facility] Patient Information 2014 [place].  Upper  Respiratory Infection, Adult An upper respiratory infection (URI) is also sometimes known as the common cold. The upper respiratory tract includes the nose, sinuses, throat, trachea, and bronchi. Bronchi are the airways leading to the lungs. Most people improve within 1 week, but symptoms can last up to 2 weeks. A residual cough may last even longer.  CAUSES Many different viruses can infect the tissues lining the upper respiratory tract. The tissues become irritated and inflamed and often become very moist. Mucus production is also common. A cold is contagious. You can easily spread the virus to others by oral contact. This includes kissing, sharing a glass, coughing, or sneezing. Touching your mouth or nose and then touching a surface, which is then touched by another person, can also spread the virus. SYMPTOMS  Symptoms typically develop 1 to 3 days after you come in contact with a cold virus. Symptoms vary from person to person. They may include:  Runny nose.  Sneezing.  Nasal congestion.  Sinus irritation.  Sore throat.  Loss of voice (laryngitis).  Cough.  Fatigue.  Muscle aches.  Loss of appetite.  Headache.  Low-grade fever. DIAGNOSIS  You might diagnose your own cold based on familiar symptoms, since most people get a cold 2 to 3 times a year. Your caregiver can confirm this based on your exam. Most importantly, your caregiver can check that your symptoms are not due to another disease such as strep throat, sinusitis, pneumonia, asthma, or epiglottitis. Blood tests, throat tests, and X-rays are not necessary to diagnose a common cold, but they may  sometimes be helpful in excluding other more serious diseases. Your caregiver will decide if any further tests are required. RISKS AND COMPLICATIONS  You may be at risk for a more severe case of the common cold if you smoke cigarettes, have chronic heart disease (such as heart failure) or lung disease (such as asthma), or if you  have a weakened immune system. The very young and very old are also at risk for more serious infections. Bacterial sinusitis, middle ear infections, and bacterial pneumonia can complicate the common cold. The common cold can worsen asthma and chronic obstructive pulmonary disease (COPD). Sometimes, these complications can require emergency medical care and may be life-threatening. PREVENTION  The best way to protect against getting a cold is to practice good hygiene. Avoid oral or hand contact with people with cold symptoms. Wash your hands often if contact occurs. There is no clear evidence that vitamin C, vitamin E, echinacea, or exercise reduces the chance of developing a cold. However, it is always recommended to get plenty of rest and practice good nutrition. TREATMENT  Treatment is directed at relieving symptoms. There is no cure. Antibiotics are not effective, because the infection is caused by a virus, not by bacteria. Treatment may include:  Increased fluid intake. Sports drinks offer valuable electrolytes, sugars, and fluids.  Breathing heated mist or steam (vaporizer or shower).  Eating chicken soup or other clear broths, and maintaining good nutrition.  Getting plenty of rest.  Using gargles or lozenges for comfort.  Controlling fevers with ibuprofen or acetaminophen as directed by your caregiver.  Increasing usage of your inhaler if you have asthma. Zinc gel and zinc lozenges, taken in the first 24 hours of the common cold, can shorten the duration and lessen the severity of symptoms. Pain medicines may help with fever, muscle aches, and throat pain. A variety of non-prescription medicines are available to treat congestion and runny nose. Your caregiver can make recommendations and may suggest nasal or lung inhalers for other symptoms.  HOME CARE INSTRUCTIONS   Only take over-the-counter or prescription medicines for pain, discomfort, or fever as directed by your caregiver.  Use  a warm mist humidifier or inhale steam from a shower to increase air moisture. This may keep secretions moist and make it easier to breathe.  Drink enough water and fluids to keep your urine clear or pale yellow.  Rest as needed.  Return to work when your temperature has returned to normal or as your caregiver advises. You may need to stay home longer to avoid infecting others. You can also use a face mask and careful hand washing to prevent spread of the virus. SEEK MEDICAL CARE IF:   After the first few days, you feel you are getting worse rather than better.  You need your caregiver's advice about medicines to control symptoms.  You develop chills, worsening shortness of breath, or brown or red sputum. These may be signs of pneumonia.  You develop yellow or brown nasal discharge or pain in the face, especially when you bend forward. These may be signs of sinusitis.  You develop a fever, swollen neck glands, pain with swallowing, or white areas in the back of your throat. These may be signs of strep throat. SEEK IMMEDIATE MEDICAL CARE IF:   You have a fever.  You develop severe or persistent headache, ear pain, sinus pain, or chest pain.  You develop wheezing, a prolonged cough, cough up blood, or have a change in your usual mucus (if  you have chronic lung disease).  You develop sore muscles or a stiff neck. Document Released: 09/04/2000 Document Revised: 06/03/2011 Document Reviewed: 07/13/2010 [facility] Patient Information 2014 [place], [place].

## 2013-05-31 ENCOUNTER — Emergency Department (HOSPITAL_BASED_OUTPATIENT_CLINIC_OR_DEPARTMENT_OTHER)
Admission: EM | Admit: 2013-05-31 | Discharge: 2013-05-31 | Disposition: A | Discharge status: Home / Self Care | Payer: No Typology Code available for payment source | Attending: Emergency Medicine | Admitting: Emergency Medicine

## 2013-05-31 ENCOUNTER — Encounter (HOSPITAL_BASED_OUTPATIENT_CLINIC_OR_DEPARTMENT_OTHER): Payer: Self-pay | Admitting: Emergency Medicine

## 2013-05-31 ENCOUNTER — Emergency Department (HOSPITAL_BASED_OUTPATIENT_CLINIC_OR_DEPARTMENT_OTHER): Payer: No Typology Code available for payment source

## 2013-05-31 DIAGNOSIS — M25549 Pain in joints of unspecified hand: Secondary | ICD-10-CM | POA: Insufficient documentation

## 2013-05-31 DIAGNOSIS — E7800 Pure hypercholesterolemia, unspecified: Secondary | ICD-10-CM | POA: Insufficient documentation

## 2013-05-31 DIAGNOSIS — M7989 Other specified soft tissue disorders: Secondary | ICD-10-CM

## 2013-05-31 DIAGNOSIS — F329 Major depressive disorder, single episode, unspecified: Secondary | ICD-10-CM | POA: Insufficient documentation

## 2013-05-31 DIAGNOSIS — I10 Essential (primary) hypertension: Secondary | ICD-10-CM | POA: Insufficient documentation

## 2013-05-31 DIAGNOSIS — Z792 Long term (current) use of antibiotics: Secondary | ICD-10-CM | POA: Insufficient documentation

## 2013-05-31 DIAGNOSIS — Z872 Personal history of diseases of the skin and subcutaneous tissue: Secondary | ICD-10-CM | POA: Insufficient documentation

## 2013-05-31 DIAGNOSIS — Z79899 Other long term (current) drug therapy: Secondary | ICD-10-CM | POA: Insufficient documentation

## 2013-05-31 DIAGNOSIS — J45909 Unspecified asthma, uncomplicated: Secondary | ICD-10-CM | POA: Insufficient documentation

## 2013-05-31 DIAGNOSIS — F3289 Other specified depressive episodes: Secondary | ICD-10-CM | POA: Insufficient documentation

## 2013-05-31 LAB — CBC WITH DIFFERENTIAL/PLATELET
BASOS PCT: 1 % (ref 0–1)
Basophils Absolute: 0 10*3/uL (ref 0.0–0.1)
Eosinophils Absolute: 0.2 10*3/uL (ref 0.0–0.7)
Eosinophils Relative: 4 % (ref 0–5)
HEMATOCRIT: 35.8 % — AB (ref 36.0–46.0)
HEMOGLOBIN: 11.5 g/dL — AB (ref 12.0–15.0)
Lymphocytes Relative: 27 % (ref 12–46)
Lymphs Abs: 1.4 10*3/uL (ref 0.7–4.0)
MCH: 29 pg (ref 26.0–34.0)
MCHC: 32.1 g/dL (ref 30.0–36.0)
MCV: 90.2 fL (ref 78.0–100.0)
MONO ABS: 0.6 10*3/uL (ref 0.1–1.0)
MONOS PCT: 11 % (ref 3–12)
NEUTROS ABS: 3 10*3/uL (ref 1.7–7.7)
Neutrophils Relative %: 58 % (ref 43–77)
Platelets: 275 10*3/uL (ref 150–400)
RBC: 3.97 MIL/uL (ref 3.87–5.11)
RDW: 15.3 % (ref 11.5–15.5)
WBC: 5.2 10*3/uL (ref 4.0–10.5)

## 2013-05-31 LAB — BASIC METABOLIC PANEL
BUN: 11 mg/dL (ref 6–23)
CHLORIDE: 107 meq/L (ref 96–112)
CO2: 23 mEq/L (ref 19–32)
CREATININE: 0.7 mg/dL (ref 0.50–1.10)
Calcium: 8.7 mg/dL (ref 8.4–10.5)
GFR calc non Af Amer: >90 mL/min (ref >90)
Glucose, Bld: 99 mg/dL (ref 70–99)
Potassium: 4 mEq/L (ref 3.7–5.3)
Sodium: 142 mEq/L (ref 137–147)

## 2013-05-31 LAB — URIC ACID: Uric Acid, Serum: 6.5 mg/dL (ref 2.4–7.0)

## 2013-05-31 MED ORDER — IBUPROFEN 800 MG PO TABS
800.0000 mg | ORAL_TABLET | Freq: Once | ORAL | Status: AC
  Administered 2013-05-31: 800 mg via ORAL
  Filled 2013-05-31: qty 1

## 2013-05-31 MED ORDER — OXYCODONE-ACETAMINOPHEN 5-325 MG PO TABS: 1.0000 | ORAL_TABLET | ORAL | Status: DC | PRN

## 2013-05-31 NOTE — Discharge Instructions (Signed)
Continued the clindamycin as instructed. If your swelling, pain, or redness becomes worse or he develop fevers or chills call Dr. Bertis Ruddy office or return to the ER for evaluation.

## 2013-05-31 NOTE — ED Notes (Signed)
Left hand pain with redness and swelling noted seen here yesterday states swelling and pain are both worse today

## 2013-05-31 NOTE — ED Provider Notes (Signed)
CSN: [id number]     Arrival date & time 05/31/13  6160 History   First MD Initiated Contact with Patient 05/31/13 1002     Chief Complaint  Patient presents with  . left hand pain and swelling worse than yesterday      (Consider location/radiation/quality/duration/timing/severity/associated sxs/prior Treatment) HPI Comments: 48 year old female presents with left hand pain and swelling since 3 days ago. She seen here yesterday and diagnosed with a cellulitis. She took her clindamycin as instructed and feels like the redness has gone away but the swelling in her hand and spread past her wrist. She feels like the swelling in her hand is still the same otherwise. No injuries or open wounds. No fevers. No numbness or tingling. She was told a fall with hand surgeon if possible but if her pain became worse as it did she should come back to the ER.   Past Medical History  Diagnosis Date  . Fibromyalgia   . Hypertension   . Depression   . Asthma   . High cholesterol    Past Surgical History  Procedure Laterality Date  . Right oophorectomy    . Cervical spine surgery    . Tubal ligation     Family History  Problem Relation Age of Onset  . Diabetes Mother   . Hypertension Mother   . Diabetes Sister   . Hypertension Sister   . Hypertension Brother    History  Substance Use Topics  . Smoking status: Never Smoker   . Smokeless tobacco: Not on file  . Alcohol Use: No   OB History   Grav Para Term Preterm Abortions TAB SAB Ect Mult Living   5 3 3       3      Review of Systems  Constitutional: Negative for fever.  Musculoskeletal: Positive for joint swelling.  Skin: Negative for color change and wound.  Neurological: Negative for weakness and numbness.  All other systems reviewed and are negative.      Allergies  Codeine; Macrodantin; and Septra  Home Medications   Current Outpatient Rx  Name  Route  Sig  Dispense  Refill  . citalopram (CELEXA) 40 MG tablet   Oral  Take 40 mg by mouth daily.           . clindamycin (CLEOCIN) 300 MG capsule   Oral   Take 1 capsule (300 mg total) by mouth 4 (four) times daily. X 7 days   28 capsule   0   . doxepin (SINEQUAN) 100 MG capsule   Oral   Take 100 mg by mouth daily.           . fexofenadine (ALLEGRA) 60 MG tablet   Oral   Take 1 tablet (60 mg total) by mouth 2 (two) times daily.   30 tablet   0   . metoprolol (TOPROL-XL) 50 MG 24 hr tablet   Oral   Take 50 mg by mouth daily.           . simvastatin (ZOCOR) 40 MG tablet   Oral   Take 40 mg by mouth at bedtime.            BP 125/70  Pulse 82  Temp(Src) 97.8 F (36.6 C) (Oral)  Resp 18  SpO2 98% Physical Exam  Nursing note and vitals reviewed. Constitutional: She is oriented to person, place, and time. She appears well-developed and well-nourished. No distress.  HENT:  Head: Normocephalic and atraumatic.  Right Ear: External  ear normal.  Left Ear: External ear normal.  Nose: Nose normal.  Eyes: Right eye exhibits no discharge. Left eye exhibits no discharge.  Cardiovascular: Normal rate and normal heart sounds.   Pulses:      Dorsalis pedis pulses are 2+ on the right side, and 2+ on the left side.  Pulmonary/Chest: Effort normal and breath sounds normal.  Abdominal: Soft. There is no tenderness.  Musculoskeletal:       Left wrist: She exhibits tenderness and swelling.       Left hand: She exhibits decreased range of motion, tenderness and swelling.  Passive extension of left fingers causing pain to her hand  Neurological: She is alert and oriented to person, place, and time.  Skin: Skin is warm and dry.    ED Course  Procedures (including critical care time) Labs Review Labs Reviewed  CBC WITH DIFFERENTIAL - Abnormal; Notable for the following:    Hemoglobin 11.5 (*)    HCT 35.8 (*)    All other components within normal limits  BASIC METABOLIC PANEL  URIC ACID   Imaging Review Dg Hand Complete Left  05/31/2013    CLINICAL DATA:  Left hand pain and swelling for 2 days, no known injury, question infection  EXAM: LEFT HAND - COMPLETE 3+ VIEW  COMPARISON:  None  FINDINGS: Soft tissue swelling greatest dorsally.  Osseous mineralization normal.  Joint spaces preserved.  No acute fracture, dislocation or bone destruction.  No soft tissue gas or radiopaque foreign bodies.  IMPRESSION: No acute osseous abnormalities.   Electronically Signed   By: Lavonia Dana M.D.   On: 05/31/2013 11:10     EKG Interpretation None      MDM   Final diagnoses:  Swelling of left hand    Patient has no erythema or cellulitis on exam currently. She is neurovascular intact although has limited range of motion of her fingers due to pain. She has asymmetric swelling but no abscess. I discussed with Dr. Burney Gauze and his PA about the patient. They're requesting a uric acid be sent and will follow the patient up in 3 days in clinic. If the patient has worsening symptoms she is to call their office and/or return to the ER. She started on clindamycin, will add Percocet for pain control (she has had this in the past w/o issues). At this time with her white count being normal, hand feels like she can be managed with antibiotics followup as an outpatient.    Ephraim Hamburger, MD 05/31/13 [phone]

## 2013-09-17 ENCOUNTER — Ambulatory Visit: Payer: 59 | Admitting: Family

## 2013-09-17 DIAGNOSIS — Z0289 Encounter for other administrative examinations: Secondary | ICD-10-CM

## 2013-10-18 ENCOUNTER — Emergency Department (HOSPITAL_BASED_OUTPATIENT_CLINIC_OR_DEPARTMENT_OTHER)
Admission: EM | Admit: 2013-10-18 | Discharge: 2013-10-18 | Disposition: A | Discharge status: Home / Self Care | Payer: 59 | Attending: Emergency Medicine | Admitting: Emergency Medicine

## 2013-10-18 ENCOUNTER — Encounter (HOSPITAL_BASED_OUTPATIENT_CLINIC_OR_DEPARTMENT_OTHER): Payer: Self-pay | Admitting: Emergency Medicine

## 2013-10-18 DIAGNOSIS — F3289 Other specified depressive episodes: Secondary | ICD-10-CM | POA: Insufficient documentation

## 2013-10-18 DIAGNOSIS — M62838 Other muscle spasm: Secondary | ICD-10-CM | POA: Insufficient documentation

## 2013-10-18 DIAGNOSIS — I10 Essential (primary) hypertension: Secondary | ICD-10-CM | POA: Insufficient documentation

## 2013-10-18 DIAGNOSIS — Z79899 Other long term (current) drug therapy: Secondary | ICD-10-CM | POA: Insufficient documentation

## 2013-10-18 DIAGNOSIS — J029 Acute pharyngitis, unspecified: Secondary | ICD-10-CM | POA: Insufficient documentation

## 2013-10-18 DIAGNOSIS — E7800 Pure hypercholesterolemia, unspecified: Secondary | ICD-10-CM | POA: Insufficient documentation

## 2013-10-18 DIAGNOSIS — J040 Acute laryngitis: Secondary | ICD-10-CM | POA: Insufficient documentation

## 2013-10-18 DIAGNOSIS — F329 Major depressive disorder, single episode, unspecified: Secondary | ICD-10-CM | POA: Insufficient documentation

## 2013-10-18 DIAGNOSIS — J45909 Unspecified asthma, uncomplicated: Secondary | ICD-10-CM | POA: Insufficient documentation

## 2013-10-18 MED ORDER — OXYCODONE-ACETAMINOPHEN 5-325 MG PO TABS: 1.0000 | ORAL_TABLET | Freq: Four times a day (QID) | ORAL | Status: DC | PRN

## 2013-10-18 MED ORDER — DEXAMETHASONE 4 MG PO TABS
ORAL_TABLET | ORAL | Status: AC
  Filled 2013-10-18: qty 3

## 2013-10-18 MED ORDER — DIAZEPAM 5 MG PO TABS: 5.0000 mg | ORAL_TABLET | Freq: Four times a day (QID) | ORAL | Status: DC | PRN

## 2013-10-18 MED ORDER — DEXAMETHASONE 4 MG PO TABS
10.0000 mg | ORAL_TABLET | Freq: Once | ORAL | Status: AC
  Administered 2013-10-18: 10 mg via ORAL

## 2013-10-18 NOTE — ED Notes (Signed)
Pt c/o sore throat x 2 weeks

## 2013-10-18 NOTE — ED Provider Notes (Signed)
CSN: [id number]     Arrival date & time 10/18/13  1744 History   First MD Initiated Contact with Patient 10/18/13 1927     Chief Complaint  Patient presents with  . Sore Throat     (Consider location/radiation/quality/duration/timing/severity/associated sxs/prior Treatment) Patient is a 48 y.o. female presenting with pharyngitis. The history is provided by the patient.  Sore Throat This is a new problem. The current episode started more than 1 week ago (2 weeks). The problem occurs constantly. The problem has not changed since onset.Pertinent negatives include no chest pain, no abdominal pain and no shortness of breath. Nothing aggravates the symptoms. Nothing relieves the symptoms. The treatment provided no relief.    Past Medical History  Diagnosis Date  . Fibromyalgia   . Hypertension   . Depression   . Asthma   . High cholesterol    Past Surgical History  Procedure Laterality Date  . Right oophorectomy    . Cervical spine surgery    . Tubal ligation     Family History  Problem Relation Age of Onset  . Diabetes Mother   . Hypertension Mother   . Diabetes Sister   . Hypertension Sister   . Hypertension Brother    History  Substance Use Topics  . Smoking status: Never Smoker   . Smokeless tobacco: Not on file  . Alcohol Use: No   OB History   Grav Para Term Preterm Abortions TAB SAB Ect Mult Living   5 3 3       3      Review of Systems  Constitutional: Negative for fever and chills.  HENT: Positive for sore throat.   Respiratory: Negative for cough and shortness of breath.   Cardiovascular: Negative for chest pain.  Gastrointestinal: Negative for abdominal pain.  All other systems reviewed and are negative.     Allergies  Codeine; Macrodantin; and Septra  Home Medications   Prior to Admission medications   Medication Sig Start Date End Date Taking? Authorizing Provider  citalopram (CELEXA) 40 MG tablet Take 40 mg by mouth daily.      Historical  Provider, MD  clindamycin (CLEOCIN) 300 MG capsule Take 1 capsule (300 mg total) by mouth 4 (four) times daily. X 7 days 05/30/13   Malvin Johns, MD  doxepin (SINEQUAN) 100 MG capsule Take 100 mg by mouth daily.      Historical Provider, MD  fexofenadine (ALLEGRA) 60 MG tablet Take 1 tablet (60 mg total) by mouth 2 (two) times daily. 05/30/13   Malvin Johns, MD  metoprolol (TOPROL-XL) 50 MG 24 hr tablet Take 50 mg by mouth daily.      Historical Provider, MD  oxyCODONE-acetaminophen (PERCOCET) 5-325 MG per tablet Take 1-2 tablets by mouth every 4 (four) hours as needed. 05/31/13   Ephraim Hamburger, MD  simvastatin (ZOCOR) 40 MG tablet Take 40 mg by mouth at bedtime.      Historical Provider, MD   BP 130/69  Pulse 70  Temp(Src) 98.1 F (36.7 C) (Oral)  Resp 16  Ht 5\' 4"  (1.626 m)  Wt 180 lb (81.647 kg)  BMI 30.88 kg/m2  SpO2 100%  LMP 09/23/2013 Physical Exam  Nursing note and vitals reviewed. Constitutional: She is oriented to person, place, and time. She appears well-developed and well-nourished. No distress.  HENT:  Head: Normocephalic and atraumatic.  Right Ear: Tympanic membrane normal.  Left Ear: Tympanic membrane normal.  Mouth/Throat: Oropharynx is clear and moist. No oropharyngeal exudate, posterior oropharyngeal  edema, posterior oropharyngeal erythema or tonsillar abscesses.  No neck swelling. Normal thyroid elevation on swallow.   Eyes: EOM are normal. Pupils are equal, round, and reactive to light.  Neck: Normal range of motion. Neck supple. No spinous process tenderness and no muscular tenderness present. No edema present.  R sided neck spasm  Cardiovascular: Normal rate and regular rhythm.  Exam reveals no friction rub.   No murmur heard. Pulmonary/Chest: Effort normal and breath sounds normal. No respiratory distress. She has no wheezes. She has no rales.  Abdominal: Soft. She exhibits no distension. There is no tenderness. There is no rebound.  Musculoskeletal: Normal  range of motion. She exhibits no edema.  Neurological: She is alert and oriented to person, place, and time. No cranial nerve deficit. She exhibits normal muscle tone. Coordination normal.  Skin: No rash noted. She is not diaphoretic.    ED Course  Procedures (including critical care time) Labs Review Labs Reviewed - No data to display  Imaging Review No results found.   EKG Interpretation None      MDM   Final diagnoses:  Laryngitis  Muscle spasm    48 year old female here with sore throat for 2 weeks. She's been hoarse. No fevers, cough, runny nose, pain with swallowing. She states this feels like her throat is closing up on her and she has a scratchy voice. Here no respiratory distress, no stridor. She has no goiter. Has normal thyroid elevation with swallowing. She states some neck swelling on the right side, but no appreciable neck edema that I can see. She has a normal oropharyngeal exam. Uvula is midline, no posterior pharyngeal fullness. Normal tonsillar pillars. Late patient's laryngitis. She states a lot of right-sided neck pain with moving her right arm, believe this is related to muscle spasms. Given diet, pain medicine, Decadron. Steroids given for the laryngitis and sore throat sensation. Stable for discharge. Given resource guide to help establish followup.    Osvaldo Shipper, MD 10/18/13 [phone]

## 2013-10-18 NOTE — Discharge Instructions (Signed)
Laryngitis At the top of your windpipe is your voice box. It is the source of your voice. Inside your voice box are 2 bands of muscles called vocal cords. When you breathe, your vocal cords are relaxed and open so that air can get into the lungs. When you decide to say something, these cords come together and vibrate. The sound from these vibrations goes into your throat and comes out through your mouth as sound. Laryngitis is an inflammation of the vocal cords that causes hoarseness, cough, loss of voice, sore throat, and dry throat. Laryngitis can be temporary (acute) or long-term (chronic). Most cases of acute laryngitis improve with time.Chronic laryngitis lasts for more than 3 weeks. CAUSES Laryngitis can often be related to excessive smoking, talking, or yelling, as well as inhalation of toxic fumes and allergies. Acute laryngitis is usually caused by a viral infection, vocal strain, measles or mumps, or bacterial infections. Chronic laryngitis is usually caused by vocal cord strain, vocal cord injury, postnasal drip, growths on the vocal cords, or acid reflux. SYMPTOMS   Cough.  Sore throat.  Dry throat. RISK FACTORS  Respiratory infections.  Exposure to irritating substances, such as cigarette smoke, excessive amounts of alcohol, stomach acids, and workplace chemicals.  Voice trauma, such as vocal cord injury from shouting or speaking too loud. DIAGNOSIS  Your cargiver will perform a physical exam. During the physical exam, your caregiver will examine your throat. The most common sign of laryngitis is hoarseness. Laryngoscopy may be necessary to confirm the diagnosis of this condition. This procedure allows your caregiver to look into the larynx. HOME CARE INSTRUCTIONS  Drink enough fluids to keep your urine clear or pale yellow.  Rest until you no longer have symptoms or as directed by your caregiver.  Breathe in moist air.  Take all medicine as directed by your  caregiver.  Do not smoke.  Talk as little as possible (this includes whispering).  Write on paper instead of talking until your voice is back to normal.  Follow up with your caregiver if your condition has not improved after 10 days. SEEK MEDICAL CARE IF:   You have trouble breathing.  You cough up blood.  You have persistent fever.  You have increasing pain.  You have difficulty swallowing. MAKE SURE YOU:  Understand these instructions.  Will watch your condition.  Will get help right away if you are not doing well or get worse. Document Released: 03/11/2005 Document Revised: 06/03/2011 Document Reviewed: 05/17/2010 [facility] Patient Information 2015 [place], [place]. This information is not intended to replace advice given to you by your health care provider. Make sure you discuss any questions you have with your health care provider.  Laryngitis Laryngitis is redness, soreness, and puffiness (inflammation) of the vocal cords. It causes hoarseness, cough, loss of voice, sore throat, and dry throat. It may be caused by:  Infection.  Too much smoking.  Too much talking or yelling.  Breathing in of toxic fumes.  Allergies.  A backup of acid from your stomach. HOME CARE  Drink enough fluids to keep your pee (urine) clear or pale yellow.  Rest until you no longer have problems or as told by your doctor.  Breathe in moist air.  Take all medicine as told by your doctor.  Do not smoke.  Talk as little as possible (this includes whispering).  Write on paper instead of talking until your voice is back to normal.  Follow up with your doctor if you have not  improved after 10 days. GET HELP IF:   You have trouble breathing.  You cough up blood.  You have a fever that will not go away.  You have increasing pain.  You have trouble swallowing. MAKE SURE YOU:  Understand these instructions.  Will watch your condition.  Will get help right away if you are  not doing well or get worse. Document Released: 02/28/2011 Document Revised: 06/03/2011 Document Reviewed: 02/28/2011 [facility] Patient Information 2015 [place], [place]. This information is not intended to replace advice given to you by your health care provider. Make sure you discuss any questions you have with your health care provider.   Emergency Department Resource Guide 1) Find a Doctor and Pay Out of Pocket Although you won't have to find out who is covered by your insurance plan, it is a good idea to ask around and get recommendations. You will then need to call the office and see if the doctor you have chosen will accept you as a new patient and what types of options they offer for patients who are self-pay. Some doctors offer discounts or will set up payment plans for their patients who do not have insurance, but you will need to ask so you aren't surprised when you get to your appointment.  2) Contact Your Local Health Department Not all health departments have doctors that can see patients for sick visits, but many do, so it is worth a call to see if yours does. If you don't know where your local health department is, you can check in your phone book. The CDC also has a tool to help you locate your state's health department, and many state websites also have listings of all of their local health departments.  3) Find a [facility] If your illness is not likely to be very severe or complicated, you may want to try a walk in clinic. These are popping up all over the country in pharmacies, drugstores, and shopping centers. They're usually staffed by nurse practitioners or physician assistants that have been trained to treat common illnesses and complaints. They're usually fairly quick and inexpensive. However, if you have serious medical issues or chronic medical problems, these are probably not your best option.  No Primary Care Doctor: - Call Health Connect at  [phone] - they can help  you locate a primary care doctor that  accepts your insurance, provides certain services, etc. - Physician Referral Service- [phone]  Chronic Pain Problems: Organization         Address  Phone   Notes  [facility]  [phone] Patients need to be referred by their primary care doctor.   Medication Assistance: Organization         Address  Phone   Notes  [facility]ication [facility] [place] [place], [place] [address] [phone] --Must be a resident of [facility] -- Must have NO insurance coverage whatsoever (no Medicaid/ Medicare, etc.) -- The pt. MUST have a primary care doctor that directs their care regularly and follows them in the community   MedAssist  [phone]   [employer]  [phone]    Agencies that provide inexpensive medical care: Organization         Address  Phone   Notes  [place]  [phone]   [facility]    [phone]   [facility] [place], [place] [address] [phone]   [place] [address] [place] [phone]   Planned Parenthood    [phone]   [facility]    [phone]   [place] and [place] [address], [place] Phone:  [phone], Fax:  [phone] Hours of Operation:  9 am - 6 pm, M-F.  Also accepts Medicaid/Medicare and self-pay.  [facility] for [place], [address], [place] Phone: [phone], Fax: [phone]. Hours of Operation:  8:30 am - 5:30 pm, M-F.  Also accepts Medicaid and self-pay.  [facility] [place] [address], [place] Phone: [phone]   [place], [place], [place] [phone], Ext. [phone] Mondays & Thursdays: 7-9 AM.  First 15 patients are seen on a first come, first serve basis.    [place] Providers:  Organization         Address  Phone   Notes  [facility] [address], [place] [phone] Also accepts self-pay patients.  [facility] [address], [place]  [phone]   [place], [address], [place] [phone]   [facility] [address], [place] [phone]   Lucianne Lei [address], [place]   [phone] Only accepts [place] Access [place] patients after they have their name applied to their card.   Self-Pay (no insurance) in [facility]:  Organization         Address  Phone   Notes  Sickle Cell Patients, [facility] [place] [phone]   [facility]gent Care [place] [phone]   [facility] [place]  [place], [place], [place] [phone]   [facility]/Dr. Osei-Bonsu  [address], [place] or [address], [place] [phone] Phone number for both [place] and [place] locations is the same.  Urgent Medical and [facility] [address] [place] [phone]   [facility] [address], [place] or [address] [phone]   [facility] [address] [place] [phone], phone; [phone], fax Sees patients 1st and 3rd Saturday of every month.  Must not qualify for public or private insurance (i.e. Medicaid, Medicare, [place] Health Choice, Veterans' Benefits)  Household income should be no more than 200% of the poverty level The clinic cannot treat you if you are pregnant or think you are pregnant  Sexually transmitted diseases are not treated at the clinic.    Dental Care: Organization         Address  Phone  Notes  [facility] Department of [facility] [place] [phone] Accepts children up to age 52 who are enrolled in [place] or [place]; pregnant women with a Medicaid card; and children who have applied for Medicaid or [place] Health Choice, but were declined, whose parents can pay a reduced fee at time of service.  [facility] Department of [facility]  [address], [place] [phone] Accepts children up to age 60 who are enrolled in [place] or [place]; pregnant women with  a Medicaid card; and children who have applied for Medicaid or [place] Health Choice, but were declined, whose parents can pay a reduced fee at time of service.  [place] Adult Dental Access PROGRAM  [place] [phone] Patients are seen by appointment only. Walk-ins are not accepted. [place] will see patients 57 years of age and older. Monday - Tuesday (8am-5pm) Most Wednesdays (8:30-5pm) $30 per visit, cash only  [facility] Adult Dental Access PROGRAM  [address], [facility] [phone] Patients are seen by appointment only. Walk-ins are not accepted. [place] will see patients 60 years of age and older. One Wednesday Evening (Monthly: Volunteer Based).  $30 per visit, cash only  [place]  [phone] for adults; Children under age 30, call Graduate Pediatric Dentistry at [phone]. Children aged 51-14, please call [phone] to request a pediatric application.  Dental services are provided in all areas of dental care including fillings, crowns and bridges, complete and partial dentures, implants, gum treatment, root canals, and extractions. Preventive care is also provided. Treatment is provided to both adults and children. Patients are selected via a lottery and there is often a waiting list.   [facility] [address], [place]  [phone] [email or web address]   [facility] [address] [place], [place] [phone], Ext. [phone] Second and Fourth Thursday of each month, opens at 6:30 AM; Clinic ends at 9 AM.  Patients are seen on a first-come first-served basis, and a limited number are seen during each clinic.   [facility]  [address] Hillard Danker [place], [place] [phone]   Eligibility Requirements You must have lived in [place], [place], or [place] counties for at least the last three months.   You cannot be eligible for state or federal sponsored [employer], including [employer], [place], or [employer].   You generally cannot be eligible for healthcare insurance through your employer.    How to apply: Eligibility screenings are held every Tuesday and Wednesday afternoon from 1:00 pm until 4:00 pm. You do not need an appointment for the interview!  [facility] [place] [address], [place], [place]   [place]  [place] Department  [place]  [phone]    Behavioral Health Resources in the Community: Intensive Outpatient Programs Organization         Address  Phone  Notes  [place]. [address] [place], [place] [phone]   [facility] Outpatient [address] [place], [place]   ADS: Alcohol & Drug Svcs [address], [place], [place]   [place] [address],  [place], [place] or [phone]   Substance Abuse Resources Organization         Address  Phone  Notes  Al[facility] Drug Services  [phone]   [place]  [phone]   The [place]   Chinita Pester  [phone]   Residential & Outpatient Substance Abuse Program  [phone]   Psychological Services Organization         Address  Phone  Notes  [facility]  [place]  [phone]   [place] [address] [place] or [phone]    Mobile Crisis Teams Organization  Address  Phone  Notes  [facility], Mobile Crisis Care Unit  [phone]   [facility]  [address] [place], [place]   [facility] [address], [place] [phone]    Self-Help/Support Groups Organization         Address  Phone             Notes  [place] of [place] - variety of support groups  [place] Call for more information  Narcotics Anonymous (NA), [facility] [address], [employer] [place]  2 meetings at this location   Special educational needs teacher         Address  Phone  Notes  [facility] [place],    [place]  [phone]   [facility]  [address], [place] [address], [place], [place]   [place], [place] [phone] Admissions: 8am-3pm M-F  Incentives Substance [place] [address]    [place], [place] [phone]   [facility] [address] [place], [place], [place]   [facility] [address],  [place], [place]   [facility]ight Programs - Intensive Outpatient [address] Kristeen Mans [address], [place], [place]   [facility] ([place].,  [place], [place] [phone] or [phone]   [facility]idential Treatment Services (RTS) [address], [place], [place] Accepts Medicaid  [facility] [place] [address]  [place] [phone] Substance Abuse/Addiction Treatment   [facility] Organization         Address  Phone  Notes  [facility]  [phone]   Domenic Schwab, PhD [address] Arlis Porta [place], [place]   [phone] or  [phone]   [place], [place] [phone]   [facility] [address] [place], [place] [phone] Insurance/Medicaid/sponsorship through [facility] and Families [address], [address] [place]                                    [place], [place] [phone] [place] [address], [place] [phone]    Dr. Adele Schilder  [phone]   [facility] [place] Dept. 1) [address], [place] 2) [place] 3)  [place] [address], [place] [phone]  [phone]   [place] [phone] or [phone] (After Hours)

## 2013-12-13 ENCOUNTER — Ambulatory Visit: Payer: 59 | Admitting: Internal Medicine

## 2013-12-13 DIAGNOSIS — Z0000 Encounter for general adult medical examination without abnormal findings: Secondary | ICD-10-CM

## 2014-01-24 ENCOUNTER — Encounter (HOSPITAL_BASED_OUTPATIENT_CLINIC_OR_DEPARTMENT_OTHER): Payer: Self-pay | Admitting: Emergency Medicine

## 2014-03-26 ENCOUNTER — Emergency Department (HOSPITAL_BASED_OUTPATIENT_CLINIC_OR_DEPARTMENT_OTHER)
Admission: EM | Admit: 2014-03-26 | Discharge: 2014-03-26 | Disposition: A | Discharge status: Home / Self Care | Payer: No Typology Code available for payment source | Attending: Emergency Medicine | Admitting: Emergency Medicine

## 2014-03-26 ENCOUNTER — Encounter (HOSPITAL_BASED_OUTPATIENT_CLINIC_OR_DEPARTMENT_OTHER): Payer: Self-pay | Admitting: *Deleted

## 2014-03-26 DIAGNOSIS — J45909 Unspecified asthma, uncomplicated: Secondary | ICD-10-CM | POA: Insufficient documentation

## 2014-03-26 DIAGNOSIS — Z8739 Personal history of other diseases of the musculoskeletal system and connective tissue: Secondary | ICD-10-CM | POA: Insufficient documentation

## 2014-03-26 DIAGNOSIS — H6993 Unspecified Eustachian tube disorder, bilateral: Secondary | ICD-10-CM | POA: Diagnosis not present

## 2014-03-26 DIAGNOSIS — H6983 Other specified disorders of Eustachian tube, bilateral: Secondary | ICD-10-CM

## 2014-03-26 DIAGNOSIS — I10 Essential (primary) hypertension: Secondary | ICD-10-CM | POA: Diagnosis not present

## 2014-03-26 DIAGNOSIS — E780 Pure hypercholesterolemia: Secondary | ICD-10-CM | POA: Diagnosis not present

## 2014-03-26 DIAGNOSIS — H9201 Otalgia, right ear: Secondary | ICD-10-CM | POA: Diagnosis present

## 2014-03-26 DIAGNOSIS — Z79899 Other long term (current) drug therapy: Secondary | ICD-10-CM | POA: Insufficient documentation

## 2014-03-26 DIAGNOSIS — J0100 Acute maxillary sinusitis, unspecified: Secondary | ICD-10-CM | POA: Insufficient documentation

## 2014-03-26 DIAGNOSIS — Z792 Long term (current) use of antibiotics: Secondary | ICD-10-CM | POA: Diagnosis not present

## 2014-03-26 DIAGNOSIS — Z7951 Long term (current) use of inhaled steroids: Secondary | ICD-10-CM | POA: Insufficient documentation

## 2014-03-26 DIAGNOSIS — F329 Major depressive disorder, single episode, unspecified: Secondary | ICD-10-CM | POA: Diagnosis not present

## 2014-03-26 MED ORDER — FLUTICASONE PROPIONATE 50 MCG/ACT NA SUSP: 2.0000 | Freq: Every day | NASAL | Status: DC

## 2014-03-26 MED ORDER — DM-GUAIFENESIN ER 30-600 MG PO TB12: 1.0000 | ORAL_TABLET | Freq: Two times a day (BID) | ORAL | Status: DC

## 2014-03-26 MED ORDER — AMOXICILLIN-POT CLAVULANATE 875-125 MG PO TABS: 1.0000 | ORAL_TABLET | Freq: Two times a day (BID) | ORAL | Status: DC

## 2014-03-26 NOTE — ED Notes (Signed)
Facial pressure and right ear pain x 1.5 weeks

## 2014-03-26 NOTE — Discharge Instructions (Signed)
Sinusitis Sinusitis is redness, soreness, and inflammation of the paranasal sinuses. Paranasal sinuses are air pockets within the bones of your face (beneath the eyes, the middle of the forehead, or above the eyes). In healthy paranasal sinuses, mucus is able to drain out, and air is able to circulate through them by way of your nose. However, when your paranasal sinuses are inflamed, mucus and air can become trapped. This can allow bacteria and other germs to grow and cause infection. Sinusitis can develop quickly and last only a short time (acute) or continue over a long period (chronic). Sinusitis that lasts for more than 12 weeks is considered chronic.  CAUSES  Causes of sinusitis include:  Allergies.  Structural abnormalities, such as displacement of the cartilage that separates your nostrils (deviated septum), which can decrease the air flow through your nose and sinuses and affect sinus drainage.  Functional abnormalities, such as when the small hairs (cilia) that line your sinuses and help remove mucus do not work properly or are not present. SIGNS AND SYMPTOMS  Symptoms of acute and chronic sinusitis are the same. The primary symptoms are pain and pressure around the affected sinuses. Other symptoms include:  Upper toothache.  Earache.  Headache.  Bad breath.  Decreased sense of smell and taste.  A cough, which worsens when you are lying flat.  Fatigue.  Fever.  Thick drainage from your nose, which often is green and may contain pus (purulent).  Swelling and warmth over the affected sinuses. DIAGNOSIS  Your health care provider will perform a physical exam. During the exam, your health care provider may:  Look in your nose for signs of abnormal growths in your nostrils (nasal polyps).  Tap over the affected sinus to check for signs of infection.  View the inside of your sinuses (endoscopy) using an imaging device that has a light attached (endoscope). If your  health care provider suspects that you have chronic sinusitis, one or more of the following tests may be recommended:  Allergy tests.  Nasal culture. A sample of mucus is taken from your nose, sent to a lab, and screened for bacteria.  Nasal cytology. A sample of mucus is taken from your nose and examined by your health care provider to determine if your sinusitis is related to an allergy. TREATMENT  Most cases of acute sinusitis are related to a viral infection and will resolve on their own within 10 days. Sometimes medicines are prescribed to help relieve symptoms (pain medicine, decongestants, nasal steroid sprays, or saline sprays).  However, for sinusitis related to a bacterial infection, your health care provider will prescribe antibiotic medicines. These are medicines that will help kill the bacteria causing the infection.  Rarely, sinusitis is caused by a fungal infection. In theses cases, your health care provider will prescribe antifungal medicine. For some cases of chronic sinusitis, surgery is needed. Generally, these are cases in which sinusitis recurs more than 3 times per year, despite other treatments. HOME CARE INSTRUCTIONS   Drink plenty of water. Water helps thin the mucus so your sinuses can drain more easily.  Use a humidifier.  Inhale steam 3 to 4 times a day (for example, sit in the bathroom with the shower running).  Apply a warm, moist washcloth to your face 3 to 4 times a day, or as directed by your health care provider.  Use saline nasal sprays to help moisten and clean your sinuses.  Take medicines only as directed by your health care provider.  If you were prescribed either an antibiotic or antifungal medicine, finish it all even if you start to feel better. SEEK IMMEDIATE MEDICAL CARE IF:  You have increasing pain or severe headaches.  You have nausea, vomiting, or drowsiness.  You have swelling around your face.  You have vision problems.  You have  a stiff neck.  You have difficulty breathing. MAKE SURE YOU:   Understand these instructions.  Will watch your condition.  Will get help right away if you are not doing well or get worse. Document Released: 03/11/2005 Document Revised: 07/26/2013 Document Reviewed: 03/26/2011 [facility] Patient Information 2015 [place], [place]. This information is not intended to replace advice given to you by your health care provider. Make sure you discuss any questions you have with your health care provider. Barotitis Media Barotitis media is inflammation of your middle ear. This occurs when the auditory tube (eustachian tube) leading from the back of your nose (nasopharynx) to your eardrum is blocked. This blockage may result from a cold, environmental allergies, or an upper respiratory infection. Unresolved barotitis media may lead to damage or hearing loss (barotrauma), which may become permanent. HOME CARE INSTRUCTIONS   Use medicines as recommended by your health care provider. Over-the-counter medicines will help unblock the canal and can help during times of air travel.  Do not put anything into your ears to clean or unplug them. Eardrops will not be helpful.  Do not swim, dive, or fly until your health care provider says it is all right to do so. If these activities are necessary, chewing gum with frequent, forceful swallowing may help. It is also helpful to hold your nose and gently blow to pop your ears for equalizing pressure changes. This forces air into the eustachian tube.  Only take over-the-counter or prescription medicines for pain, discomfort, or fever as directed by your health care provider.  A decongestant may be helpful in decongesting the middle ear and make pressure equalization easier. SEEK MEDICAL CARE IF:  You experience a serious form of dizziness in which you feel as if the room is spinning and you feel nauseated (vertigo).  Your symptoms only involve one ear. SEEK  IMMEDIATE MEDICAL CARE IF:   You develop a severe headache, dizziness, or severe ear pain.  You have bloody or pus-like drainage from your ears.  You develop a fever.  Your problems do not improve or become worse. MAKE SURE YOU:   Understand these instructions.  Will watch your condition.  Will get help right away if you are not doing well or get worse. Document Released: 03/08/2000 Document Revised: 12/30/2012 Document Reviewed: 10/06/2012 [facility] Patient Information 2015 [place], [place]. This information is not intended to replace advice given to you by your health care provider. Make sure you discuss any questions you have with your health care provider.

## 2014-03-26 NOTE — ED Provider Notes (Signed)
CSN: [id number]     Arrival date & time 03/26/14  1218 History   First MD Initiated Contact with Patient 03/26/14 1229     Chief Complaint  Patient presents with  . Otalgia    Katherine Wiggins is a 49 y.o. female with a history of hypertension and asthma who presents the emergency department complaining of 1-1/2 weeks of right ear pain, right facial pressure and nasal congestion. Patient reports her right ear feels full and like there is pressure behind her ear. Patient also reports facial pain. Patient reports using Q-tips without relief. The patient reports some postnasal drip, intermittent cough, nasal congestion and ear pain. The patient denies recent ear infections. The patient denies recent antibiotic use. The patient denies fevers, chills, abdominal pain, nausea, vomiting, productive cough, shortness of breath, wheezing, chest pain, palpitations, ear discharge or rashes.  (Consider location/radiation/quality/duration/timing/severity/associated sxs/prior Treatment) HPI  Past Medical History  Diagnosis Date  . Fibromyalgia   . Hypertension   . Depression   . Asthma   . High cholesterol    Past Surgical History  Procedure Laterality Date  . Right oophorectomy    . Cervical spine surgery    . Tubal ligation     Family History  Problem Relation Age of Onset  . Diabetes Mother   . Hypertension Mother   . Diabetes Sister   . Hypertension Sister   . Hypertension Brother    History  Substance Use Topics  . Smoking status: Never Smoker   . Smokeless tobacco: Never Used  . Alcohol Use: No   OB History    Gravida Para Term Preterm AB TAB SAB Ectopic Multiple Living   5 3 3       3      Review of Systems  Constitutional: Negative for fever and chills.  HENT: Positive for postnasal drip, rhinorrhea, sinus pressure and sneezing. Negative for congestion, drooling, ear discharge, facial swelling, hearing loss, mouth sores, sore throat and trouble swallowing.   Eyes: Negative for  pain and visual disturbance.  Respiratory: Negative for cough, shortness of breath and wheezing.   Cardiovascular: Negative for chest pain and palpitations.  Gastrointestinal: Negative for nausea, vomiting, abdominal pain and diarrhea.  Genitourinary: Negative for dysuria and hematuria.  Musculoskeletal: Negative for back pain and neck pain.  Skin: Negative for rash.  Neurological: Positive for headaches. Negative for dizziness, weakness and light-headedness.  All other systems reviewed and are negative.     Allergies  Codeine; Macrodantin; and Septra  Home Medications   Prior to Admission medications   Medication Sig Start Date End Date Taking? Authorizing Provider  citalopram (CELEXA) 40 MG tablet Take 40 mg by mouth daily.     Yes Historical Provider, MD  doxepin (SINEQUAN) 100 MG capsule Take 100 mg by mouth daily.     Yes Historical Provider, MD  metoprolol (TOPROL-XL) 50 MG 24 hr tablet Take 50 mg by mouth daily.     Yes Historical Provider, MD  simvastatin (ZOCOR) 40 MG tablet Take 40 mg by mouth at bedtime.     Yes Historical Provider, MD  amoxicillin-clavulanate (AUGMENTIN) 875-125 MG per tablet Take 1 tablet by mouth every 12 (twelve) hours. 03/26/14   Verda Cumins Tallie Dodds, PA-C  clindamycin (CLEOCIN) 300 MG capsule Take 1 capsule (300 mg total) by mouth 4 (four) times daily. X 7 days 05/30/13   Malvin Johns, MD  dextromethorphan-guaiFENesin [facility] DM) 30-600 MG per 12 hr tablet Take 1 tablet by mouth 2 (two) times daily.  03/26/14   Verda Cumins Lachrisha Ziebarth, PA-C  diazepam (VALIUM) 5 MG tablet Take 1 tablet (5 mg total) by mouth every 6 (six) hours as needed for anxiety (spasms). 10/18/13   Evelina Bucy, MD  fexofenadine (ALLEGRA) 60 MG tablet Take 1 tablet (60 mg total) by mouth 2 (two) times daily. 05/30/13   Malvin Johns, MD  fluticasone (FLONASE) 50 MCG/ACT nasal spray Place 2 sprays into both nostrils daily. 03/26/14   Verda Cumins Silas Sedam, PA-C  oxyCODONE-acetaminophen  (PERCOCET) 5-325 MG per tablet Take 1-2 tablets by mouth every 4 (four) hours as needed. 05/31/13   Ephraim Hamburger, MD  oxyCODONE-acetaminophen (PERCOCET) 5-325 MG per tablet Take 1 tablet by mouth every 6 (six) hours as needed for moderate pain. 10/18/13   Evelina Bucy, MD   BP 135/79 mmHg  Pulse 84  Temp(Src) 98.2 F (36.8 C) (Oral)  Resp 16  Ht 5\' 3"  (1.6 m)  Wt 180 lb (81.647 kg)  BMI 31.89 kg/m2  SpO2 98%  LMP 01/24/2014 Physical Exam  Constitutional: She is oriented to person, place, and time. She appears well-developed and well-nourished. No distress.  HENT:  Head: Normocephalic and atraumatic.  Right Ear: External ear normal.  Left Ear: External ear normal.  Mouth/Throat: Oropharynx is clear and moist. No oropharyngeal exudate.  Patient's bilateral tympanic membranes are pearly gray without erythema or loss of landmarks. The patient has clear middle ear fluid bilaterally. There is no temporal tenderness or edema noted. Patient has bilateral maxillary sinus tenderness. Patient has boggy mucous membranes in her nose bilaterally.  Eyes: Conjunctivae and EOM are normal. Pupils are equal, round, and reactive to light. Right eye exhibits no discharge. Left eye exhibits no discharge.  Neck: Normal range of motion. Neck supple.  Cardiovascular: Normal rate, regular rhythm, normal heart sounds and intact distal pulses.  Exam reveals no gallop and no friction rub.   No murmur heard. Pulmonary/Chest: Effort normal and breath sounds normal. No respiratory distress. She has no wheezes. She has no rales.  Abdominal: Soft. There is no tenderness.  Musculoskeletal: She exhibits no edema.  Lymphadenopathy:    She has no cervical adenopathy.  Neurological: She is alert and oriented to person, place, and time. No cranial nerve deficit. Coordination normal.  Skin: Skin is warm and dry. No rash noted. She is not diaphoretic. No erythema. No pallor.  Psychiatric: She has a normal mood and affect.  Her behavior is normal.  Nursing note and vitals reviewed.   ED Course  Procedures (including critical care time) Labs Review Labs Reviewed - No data to display  Imaging Review No results found.   EKG Interpretation None      Filed Vitals:   03/26/14 1224  BP: 135/79  Pulse: 84  Temp: 98.2 F (36.8 C)  TempSrc: Oral  Resp: 16  Height: 5\' 3"  (1.6 m)  Weight: 180 lb (81.647 kg)  SpO2: 98%     MDM   Meds given in ED:  Medications - No data to display  Discharge Medication List as of 03/26/2014 12:51 PM    START taking these medications   Details  amoxicillin-clavulanate (AUGMENTIN) 875-125 MG per tablet Take 1 tablet by mouth every 12 (twelve) hours., Starting 03/26/2014, Until Discontinued, Print    dextromethorphan-guaiFENesin (MUCINEX DM) 30-600 MG per 12 hr tablet Take 1 tablet by mouth 2 (two) times daily., Starting 03/26/2014, Until Discontinued, Print    fluticasone (FLONASE) 50 MCG/ACT nasal spray Place 2 sprays into both nostrils daily., Starting 03/26/2014,  Until Discontinued, Print        Final diagnoses:  Acute maxillary sinusitis, recurrence not specified  Eustachian tube dysfunction, bilateral   Patient presents to the ED with 1-1/2 weeks of facial pressure and ear pain, with nasal congestion and postnasal drip. The patient is afebrile and nontoxic appearing. The patient has maxillary sinus tenderness bilaterally. Patient has bilateral clear air fluid without erythema. We'll treat this patient for sinusitis and eustachian tube dysfunction. I advised patient to follow-up with her primary care doctor next week. I advised patient to return to the emergency department with new or worsening symptoms or new concerns. The patient verbalized understanding and agreement of plan.     Hanley Hays, PA-C 03/27/14 [id number]  Ephraim Hamburger, MD 03/30/14 [phone]

## 2014-08-10 ENCOUNTER — Emergency Department (HOSPITAL_BASED_OUTPATIENT_CLINIC_OR_DEPARTMENT_OTHER)
Admission: EM | Admit: 2014-08-10 | Discharge: 2014-08-10 | Disposition: A | Discharge status: Home / Self Care | Payer: PRIVATE HEALTH INSURANCE | Attending: Emergency Medicine | Admitting: Emergency Medicine

## 2014-08-10 ENCOUNTER — Encounter (HOSPITAL_BASED_OUTPATIENT_CLINIC_OR_DEPARTMENT_OTHER): Payer: Self-pay

## 2014-08-10 DIAGNOSIS — F329 Major depressive disorder, single episode, unspecified: Secondary | ICD-10-CM | POA: Insufficient documentation

## 2014-08-10 DIAGNOSIS — E780 Pure hypercholesterolemia: Secondary | ICD-10-CM | POA: Insufficient documentation

## 2014-08-10 DIAGNOSIS — Z792 Long term (current) use of antibiotics: Secondary | ICD-10-CM | POA: Diagnosis not present

## 2014-08-10 DIAGNOSIS — I10 Essential (primary) hypertension: Secondary | ICD-10-CM | POA: Insufficient documentation

## 2014-08-10 DIAGNOSIS — N898 Other specified noninflammatory disorders of vagina: Secondary | ICD-10-CM

## 2014-08-10 DIAGNOSIS — Z79899 Other long term (current) drug therapy: Secondary | ICD-10-CM | POA: Diagnosis not present

## 2014-08-10 DIAGNOSIS — Z7951 Long term (current) use of inhaled steroids: Secondary | ICD-10-CM | POA: Diagnosis not present

## 2014-08-10 DIAGNOSIS — R300 Dysuria: Secondary | ICD-10-CM

## 2014-08-10 DIAGNOSIS — J45909 Unspecified asthma, uncomplicated: Secondary | ICD-10-CM | POA: Insufficient documentation

## 2014-08-10 DIAGNOSIS — Z3202 Encounter for pregnancy test, result negative: Secondary | ICD-10-CM | POA: Diagnosis not present

## 2014-08-10 LAB — WET PREP, GENITAL
CLUE CELLS WET PREP: NONE SEEN
TRICH WET PREP: NONE SEEN
WBC, Wet Prep HPF POC: NONE SEEN
Yeast Wet Prep HPF POC: NONE SEEN

## 2014-08-10 LAB — URINALYSIS, ROUTINE W REFLEX MICROSCOPIC
Bilirubin Urine: NEGATIVE
GLUCOSE, UA: NEGATIVE mg/dL
HGB URINE DIPSTICK: NEGATIVE
Ketones, ur: NEGATIVE mg/dL
LEUKOCYTES UA: NEGATIVE
Nitrite: NEGATIVE
PH: 6 (ref 5.0–8.0)
PROTEIN: NEGATIVE mg/dL
Specific Gravity, Urine: 1.025 (ref 1.005–1.030)
Urobilinogen, UA: 0.2 mg/dL (ref 0.0–1.0)

## 2014-08-10 LAB — PREGNANCY, URINE: PREG TEST UR: NEGATIVE

## 2014-08-10 NOTE — ED Notes (Signed)
Vaginal discharge x 1 week with odor. Discharge is thick and white. Reports some nausea. Tried monostat at home, no relief. Sexually active with 1 partner and no condom use.

## 2014-08-10 NOTE — ED Provider Notes (Signed)
CSN: [id number]     Arrival date & time 08/10/14  1520 History   First MD Initiated Contact with Patient 08/10/14 1535     Chief Complaint  Patient presents with  . Vaginal Discharge     (Consider location/radiation/quality/duration/timing/severity/associated sxs/prior Treatment) Patient is a 49 y.o. female presenting with vaginal discharge. The history is provided by the patient. No language interpreter was used.  Vaginal Discharge Quality:  White Severity:  Moderate Onset quality:  Sudden Duration:  1 week Timing:  Constant Progression:  Unchanged Chronicity:  New Associated symptoms: no abdominal pain and no urinary incontinence   Risk factors: no STI exposure and no unprotected sex     Past Medical History  Diagnosis Date  . Fibromyalgia   . Hypertension   . Depression   . Asthma   . High cholesterol    Past Surgical History  Procedure Laterality Date  . Right oophorectomy    . Cervical spine surgery    . Tubal ligation     Family History  Problem Relation Age of Onset  . Diabetes Mother   . Hypertension Mother   . Diabetes Sister   . Hypertension Sister   . Hypertension Brother    History  Substance Use Topics  . Smoking status: Never Smoker   . Smokeless tobacco: Never Used  . Alcohol Use: No   OB History    Gravida Para Term Preterm AB TAB SAB Ectopic Multiple Living   5 3 3       3      Review of Systems  Gastrointestinal: Negative for abdominal pain.  Genitourinary: Positive for vaginal discharge. Negative for bladder incontinence.  All other systems reviewed and are negative.     Allergies  Codeine; Macrodantin; and Septra  Home Medications   Prior to Admission medications   Medication Sig Start Date End Date Taking? Authorizing Provider  amoxicillin-clavulanate (AUGMENTIN) 875-125 MG per tablet Take 1 tablet by mouth every 12 (twelve) hours. 03/26/14   Waynetta Pean, PA-C  citalopram (CELEXA) 40 MG tablet Take 40 mg by mouth daily.       Historical Provider, MD  clindamycin (CLEOCIN) 300 MG capsule Take 1 capsule (300 mg total) by mouth 4 (four) times daily. X 7 days 05/30/13   Malvin Johns, MD  dextromethorphan-guaiFENesin [facility] DM) 30-600 MG per 12 hr tablet Take 1 tablet by mouth 2 (two) times daily. 03/26/14   Waynetta Pean, PA-C  diazepam (VALIUM) 5 MG tablet Take 1 tablet (5 mg total) by mouth every 6 (six) hours as needed for anxiety (spasms). 10/18/13   Evelina Bucy, MD  doxepin (SINEQUAN) 100 MG capsule Take 100 mg by mouth daily.      Historical Provider, MD  fexofenadine (ALLEGRA) 60 MG tablet Take 1 tablet (60 mg total) by mouth 2 (two) times daily. 05/30/13   Malvin Johns, MD  fluticasone (FLONASE) 50 MCG/ACT nasal spray Place 2 sprays into both nostrils daily. 03/26/14   Waynetta Pean, PA-C  metoprolol (TOPROL-XL) 50 MG 24 hr tablet Take 50 mg by mouth daily.      Historical Provider, MD  oxyCODONE-acetaminophen (PERCOCET) 5-325 MG per tablet Take 1-2 tablets by mouth every 4 (four) hours as needed. 05/31/13   Sherwood Gambler, MD  oxyCODONE-acetaminophen (PERCOCET) 5-325 MG per tablet Take 1 tablet by mouth every 6 (six) hours as needed for moderate pain. 10/18/13   Evelina Bucy, MD  simvastatin (ZOCOR) 40 MG tablet Take 40 mg by mouth at bedtime.  Historical Provider, MD   BP 127/84 mmHg  Pulse 82  Temp(Src) 98.2 F (36.8 C) (Oral)  Resp 18  Ht 5\' 4"  (1.626 m)  Wt 200 lb (90.719 kg)  BMI 34.31 kg/m2  SpO2 100%  LMP 08/05/2014 Physical Exam  Constitutional: She is oriented to person, place, and time. She appears well-developed and well-nourished.  Cardiovascular: Normal rate and regular rhythm.   Pulmonary/Chest: Effort normal and breath sounds normal.  Abdominal: Soft. Bowel sounds are normal. There is no tenderness.  Genitourinary:  White discharge. No cmt  Musculoskeletal: Normal range of motion.  Neurological: She is alert and oriented to person, place, and time.  Skin: Skin is warm and dry.   Psychiatric: She has a normal mood and affect.  Nursing note and vitals reviewed.   ED Course  Procedures (including critical care time) Labs Review Labs Reviewed  WET PREP, GENITAL  URINALYSIS, ROUTINE W REFLEX MICROSCOPIC  PREGNANCY, URINE  HIV ANTIBODY (ROUTINE TESTING)  RPR  GC/CHLAMYDIA PROBE AMP ()    Imaging Review No results found.   EKG Interpretation None      MDM   Final diagnoses:  Vaginal discharge  Dysuria    Exam not consistent with std. Cultures sent. Abdomen non tender.     Glendell Docker, NP 08/10/14 1624  Wandra Arthurs, MD 08/10/14 [phone]

## 2014-08-11 LAB — HIV ANTIBODY (ROUTINE TESTING W REFLEX): HIV Screen 4th Generation wRfx: NONREACTIVE

## 2014-08-11 LAB — GC/CHLAMYDIA PROBE AMP (~~LOC~~) NOT AT ARMC
CHLAMYDIA, DNA PROBE: NEGATIVE
Neisseria Gonorrhea: NEGATIVE

## 2014-08-11 LAB — RPR: RPR: NONREACTIVE

## 2014-12-22 ENCOUNTER — Encounter (HOSPITAL_BASED_OUTPATIENT_CLINIC_OR_DEPARTMENT_OTHER): Payer: Self-pay

## 2014-12-22 ENCOUNTER — Emergency Department (HOSPITAL_BASED_OUTPATIENT_CLINIC_OR_DEPARTMENT_OTHER)
Admission: EM | Admit: 2014-12-22 | Discharge: 2014-12-22 | Disposition: A | Discharge status: Home / Self Care | Payer: PRIVATE HEALTH INSURANCE | Attending: Emergency Medicine | Admitting: Emergency Medicine

## 2014-12-22 DIAGNOSIS — M79651 Pain in right thigh: Secondary | ICD-10-CM

## 2014-12-22 DIAGNOSIS — Z7951 Long term (current) use of inhaled steroids: Secondary | ICD-10-CM | POA: Insufficient documentation

## 2014-12-22 DIAGNOSIS — M545 Low back pain, unspecified: Secondary | ICD-10-CM

## 2014-12-22 DIAGNOSIS — Z79899 Other long term (current) drug therapy: Secondary | ICD-10-CM | POA: Diagnosis not present

## 2014-12-22 DIAGNOSIS — F329 Major depressive disorder, single episode, unspecified: Secondary | ICD-10-CM | POA: Insufficient documentation

## 2014-12-22 DIAGNOSIS — R609 Edema, unspecified: Secondary | ICD-10-CM | POA: Insufficient documentation

## 2014-12-22 DIAGNOSIS — M797 Fibromyalgia: Secondary | ICD-10-CM | POA: Diagnosis not present

## 2014-12-22 DIAGNOSIS — J45909 Unspecified asthma, uncomplicated: Secondary | ICD-10-CM | POA: Insufficient documentation

## 2014-12-22 DIAGNOSIS — I10 Essential (primary) hypertension: Secondary | ICD-10-CM | POA: Insufficient documentation

## 2014-12-22 DIAGNOSIS — E780 Pure hypercholesterolemia: Secondary | ICD-10-CM | POA: Diagnosis not present

## 2014-12-22 MED ORDER — CYCLOBENZAPRINE HCL 10 MG PO TABS: 10.0000 mg | ORAL_TABLET | Freq: Three times a day (TID) | ORAL | Status: DC | PRN

## 2014-12-22 MED ORDER — NAPROXEN 500 MG PO TABS: 500.0000 mg | ORAL_TABLET | Freq: Two times a day (BID) | ORAL | Status: DC

## 2014-12-22 NOTE — ED Provider Notes (Signed)
CSN: [id number]     Arrival date & time 12/22/14  1093 History   First MD Initiated Contact with Patient 12/22/14 1000     Chief Complaint  Patient presents with  . right hip pain      (Consider location/radiation/quality/duration/timing/severity/associated sxs/prior Treatment) The history is provided by the patient.   49 year old the female with complaint of right back pain lower part of the back radiating to right hip right anterior thigh and posterior thigh worse with movement. Ongoing for 3 weeks. Pain reported 8 out of 10. Patient has a primary care doctor [facility]. Patient has tried Tylenol and Motrin for this in the past. No history of any injury. Patient denies any numbness or weakness to her legs. No incontinence. It is painful to move the right leg. Patient has a history of fibromyalgia and arthritis.  Past Medical History  Diagnosis Date  . Fibromyalgia   . Hypertension   . Depression   . Asthma   . High cholesterol    Past Surgical History  Procedure Laterality Date  . Right oophorectomy    . Cervical spine surgery    . Tubal ligation     Family History  Problem Relation Age of Onset  . Diabetes Mother   . Hypertension Mother   . Diabetes Sister   . Hypertension Sister   . Hypertension Brother    Social History  Substance Use Topics  . Smoking status: Never Smoker   . Smokeless tobacco: Never Used  . Alcohol Use: No   OB History    Gravida Para Term Preterm AB TAB SAB Ectopic Multiple Living   5 3 3       3      Review of Systems  Constitutional: Negative for fever.  HENT: Negative for congestion.   Eyes: Negative for visual disturbance.  Respiratory: Negative for shortness of breath.   Cardiovascular: Negative for chest pain.  Gastrointestinal: Negative for abdominal pain.  Genitourinary: Negative for dysuria and difficulty urinating.  Musculoskeletal: Positive for myalgias, back pain and joint swelling. Negative for neck  pain.  Skin: Negative for rash.  Neurological: Negative for weakness and numbness.  Hematological: Does not bruise/bleed easily.  Psychiatric/Behavioral: Negative for confusion.      Allergies  Codeine; Macrodantin; and Septra  Home Medications   Prior to Admission medications   Medication Sig Start Date End Date Taking? Authorizing Provider  citalopram (CELEXA) 40 MG tablet Take 40 mg by mouth daily.      Historical Provider, MD  cyclobenzaprine (FLEXERIL) 10 MG tablet Take 1 tablet (10 mg total) by mouth 3 (three) times daily as needed for muscle spasms. 12/22/14   Fredia Sorrow, MD  diazepam (VALIUM) 5 MG tablet Take 1 tablet (5 mg total) by mouth every 6 (six) hours as needed for anxiety (spasms). 10/18/13   Evelina Bucy, MD  doxepin (SINEQUAN) 100 MG capsule Take 100 mg by mouth daily.      Historical Provider, MD  fexofenadine (ALLEGRA) 60 MG tablet Take 1 tablet (60 mg total) by mouth 2 (two) times daily. 05/30/13   Malvin Johns, MD  fluticasone (FLONASE) 50 MCG/ACT nasal spray Place 2 sprays into both nostrils daily. 03/26/14   Waynetta Pean, PA-C  metoprolol (TOPROL-XL) 50 MG 24 hr tablet Take 50 mg by mouth daily.      Historical Provider, MD  naproxen (NAPROSYN) 500 MG tablet Take 1 tablet (500 mg total) by mouth 2 (two) times daily. 12/22/14  Fredia Sorrow, MD  simvastatin (ZOCOR) 40 MG tablet Take 40 mg by mouth at bedtime.      Historical Provider, MD   BP 131/71 mmHg  Pulse 82  Temp(Src) 98.4 F (36.9 C) (Oral)  Resp 20  Ht 5\' 3"  (1.6 m)  Wt 180 lb (81.647 kg)  BMI 31.89 kg/m2  SpO2 100% Physical Exam  Constitutional: She is oriented to person, place, and time. She appears well-developed and well-nourished. No distress.  HENT:  Head: Normocephalic and atraumatic.  Mouth/Throat: Oropharynx is clear and moist.  Eyes: Conjunctivae and EOM are normal. Pupils are equal, round, and reactive to light.  Neck: Normal range of motion.  Cardiovascular: Normal rate,  regular rhythm and normal heart sounds.   No murmur heard. Pulmonary/Chest: Effort normal and breath sounds normal. No respiratory distress.  Abdominal: Soft. Bowel sounds are normal. There is no tenderness.  Musculoskeletal: Normal range of motion. She exhibits edema.  Trace edema to both legs. Dorsalis pedis pulse 2+. Sensation intact. Increased pain with elevation of the right leg. The pain is in the thigh area. Anterior and posterior thigh area.  Neurological: She is alert and oriented to person, place, and time. No cranial nerve deficit. She exhibits normal muscle tone. Coordination normal.  Skin: Skin is warm.  Nursing note and vitals reviewed.   ED Course  Procedures (including critical care time) Labs Review Labs Reviewed - No data to display  Imaging Review No results found. I have personally reviewed and evaluated these images and lab results as part of my medical decision-making.   EKG Interpretation None      MDM   Final diagnoses:  Right-sided low back pain without sciatica  Pain of right thigh    Patient with three-week history complaint of right lower back pain that radiates into the anterior and posterior part of the thigh. No history of any injury. No history of any past arthritis in the hip. Patient does have a primary care doctor. Will treat symptomatically with Naprosyn and Flexeril and have patient follow with primary care doctor. No motor sensory neuro deficits to the legs. No incontinence.    Fredia Sorrow, MD 12/22/14 1046

## 2014-12-22 NOTE — Discharge Instructions (Signed)
Work note provided. Take the Naprosyn on a regular basis. Take the Flexeril as directed. Make an appointment to follow-up with your doctor. Return for any new or worse symptoms.

## 2014-12-22 NOTE — ED Notes (Signed)
Patient here with right hip pain x 3 weeks. Reports that the pain is constant, denies trauma. Taking ibuprofen with minimal relief

## 2015-01-15 ENCOUNTER — Emergency Department (HOSPITAL_BASED_OUTPATIENT_CLINIC_OR_DEPARTMENT_OTHER)
Admission: EM | Admit: 2015-01-15 | Discharge: 2015-01-15 | Disposition: A | Discharge status: Home / Self Care | Payer: PRIVATE HEALTH INSURANCE | Attending: Emergency Medicine | Admitting: Emergency Medicine

## 2015-01-15 ENCOUNTER — Encounter (HOSPITAL_BASED_OUTPATIENT_CLINIC_OR_DEPARTMENT_OTHER): Payer: Self-pay | Admitting: *Deleted

## 2015-01-15 DIAGNOSIS — M5412 Radiculopathy, cervical region: Secondary | ICD-10-CM | POA: Diagnosis not present

## 2015-01-15 DIAGNOSIS — Z79899 Other long term (current) drug therapy: Secondary | ICD-10-CM | POA: Insufficient documentation

## 2015-01-15 DIAGNOSIS — Z791 Long term (current) use of non-steroidal anti-inflammatories (NSAID): Secondary | ICD-10-CM | POA: Insufficient documentation

## 2015-01-15 DIAGNOSIS — M797 Fibromyalgia: Secondary | ICD-10-CM | POA: Diagnosis not present

## 2015-01-15 DIAGNOSIS — J45909 Unspecified asthma, uncomplicated: Secondary | ICD-10-CM | POA: Diagnosis not present

## 2015-01-15 DIAGNOSIS — F329 Major depressive disorder, single episode, unspecified: Secondary | ICD-10-CM | POA: Insufficient documentation

## 2015-01-15 DIAGNOSIS — M542 Cervicalgia: Secondary | ICD-10-CM | POA: Diagnosis present

## 2015-01-15 DIAGNOSIS — I10 Essential (primary) hypertension: Secondary | ICD-10-CM | POA: Diagnosis not present

## 2015-01-15 DIAGNOSIS — E7800 Pure hypercholesterolemia, unspecified: Secondary | ICD-10-CM | POA: Insufficient documentation

## 2015-01-15 DIAGNOSIS — Z7951 Long term (current) use of inhaled steroids: Secondary | ICD-10-CM | POA: Insufficient documentation

## 2015-01-15 MED ORDER — ORPHENADRINE CITRATE ER 100 MG PO TB12: 100.0000 mg | ORAL_TABLET | Freq: Two times a day (BID) | ORAL | Status: DC

## 2015-01-15 MED ORDER — PREDNISONE 20 MG PO TABS: ORAL_TABLET | ORAL | Status: DC

## 2015-01-15 MED ORDER — HYDROCODONE-ACETAMINOPHEN 5-325 MG PO TABS: 1.0000 | ORAL_TABLET | Freq: Four times a day (QID) | ORAL | Status: DC | PRN

## 2015-01-15 NOTE — ED Notes (Signed)
Presents with right sided neck pain, radiates down rt arm and pain radiates up from neck to occipital area

## 2015-01-15 NOTE — Discharge Instructions (Signed)

## 2015-01-15 NOTE — ED Notes (Signed)
Rt wrist and rt hand appears swollen

## 2015-01-15 NOTE — ED Provider Notes (Signed)
CSN: [id number]     Arrival date & time 01/15/15  7169 History   First MD Initiated Contact with Patient 01/15/15 1034     Chief Complaint  Patient presents with  . Neck Pain     (Consider location/radiation/quality/duration/timing/severity/associated sxs/prior Treatment) HPI  Patient reports pain started somewhat gradually 3 days ago. There is pain along the right side of the patient's neck radiating down across her shoulder and into her arm and up the back of her neck. 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 The arm is not numb or weak but it does hurt if she moves it in certain directions and if she turns her head. She reports a pressure along the side of her shoulder and back of her neck is uncomfortable. She reports she had a bulging disc that was removed on the left side several years ago. She was told there was one bulging somewhat on the right but it did not need treatment at that time. Review of systems patient endorses cold symptoms recently with cough. No fever. Past Medical History  Diagnosis Date  . Fibromyalgia   . Hypertension   . Depression   . Asthma   . High cholesterol    Past Surgical History  Procedure Laterality Date  . Right oophorectomy    . Cervical spine surgery    . Tubal ligation     Family History  Problem Relation Age of Onset  . Diabetes Mother   . Hypertension Mother   . Diabetes Sister   . Hypertension Sister   . Hypertension Brother    Social History  Substance Use Topics  . Smoking status: Never Smoker   . Smokeless tobacco: Never Used  . Alcohol Use: No   OB History    Gravida Para Term Preterm AB TAB SAB Ectopic Multiple Living   5 3 3       3      Review of Systems  10 Systems reviewed and are negative for acute change except as noted in the HPI.    Allergies  Codeine; Macrodantin; and Septra  Home Medications   Prior to Admission medications   Medication Sig Start Date End  Date Taking? Authorizing Provider  citalopram (CELEXA) 40 MG tablet Take 40 mg by mouth daily.      Historical Provider, MD  cyclobenzaprine (FLEXERIL) 10 MG tablet Take 1 tablet (10 mg total) by mouth 3 (three) times daily as needed for muscle spasms. 12/22/14   Fredia Sorrow, MD  diazepam (VALIUM) 5 MG tablet Take 1 tablet (5 mg total) by mouth every 6 (six) hours as needed for anxiety (spasms). 10/18/13   Evelina Bucy, MD  doxepin (SINEQUAN) 100 MG capsule Take 100 mg by mouth daily.      Historical Provider, MD  fexofenadine (ALLEGRA) 60 MG tablet Take 1 tablet (60 mg total) by mouth 2 (two) times daily. 05/30/13   Malvin Johns, MD  fluticasone (FLONASE) 50 MCG/ACT nasal spray Place 2 sprays into both nostrils daily. 03/26/14   Waynetta Pean, PA-C  HYDROcodone-acetaminophen (NORCO/VICODIN) 5-325 MG tablet Take 1 tablet by mouth every 6 (six) hours as needed for moderate pain or severe pain. 01/15/15   Charlesetta Shanks, MD  metoprolol (TOPROL-XL) 50 MG 24 hr tablet Take 50 mg by mouth daily.      Historical Provider, MD  naproxen (NAPROSYN) 500 MG tablet Take 1 tablet (500 mg total) by mouth 2 (two) times daily. 12/22/14  Fredia Sorrow, MD  orphenadrine (NORFLEX) 100 MG tablet Take 1 tablet (100 mg total) by mouth 2 (two) times daily. 01/15/15   Charlesetta Shanks, MD  predniSONE (DELTASONE) 20 MG tablet 3 tabs po day one, then 2 po daily x 4 days 01/15/15   Charlesetta Shanks, MD  simvastatin (ZOCOR) 40 MG tablet Take 40 mg by mouth at bedtime.      Historical Provider, MD   BP 143/81 mmHg  Pulse 100  Temp(Src) 98 F (36.7 C) (Oral)  Resp 18  Ht 5\' 4"  (1.626 m)  Wt 202 lb (91.627 kg)  BMI 34.66 kg/m2  SpO2 100%  LMP 01/07/2015 (Exact Date) Physical Exam  Constitutional: She is oriented to person, place, and time. She appears well-developed and well-nourished.  HENT:  Head: Normocephalic and atraumatic.  Eyes: EOM are normal. Pupils are equal, round, and reactive to light.  Neck: Neck supple.   Cardiovascular: Normal rate, regular rhythm, normal heart sounds and intact distal pulses.   Pulmonary/Chest: Effort normal and breath sounds normal.  Abdominal: Soft. Bowel sounds are normal. She exhibits no distension. There is no tenderness.  Musculoskeletal: Normal range of motion. She exhibits tenderness. She exhibits no edema.  Patient endorses significant tenderness to palpation along the anterior body of the trapezius up the paracervical neck on the right. She also endorses pain along the top of the shoulder. Pain is reproduced by range of motion of the neck and elevation of the arm. Patient is neurovascularly intact without objective soft tissue swelling. Grip strength is intact.  Neurological: She is alert and oriented to person, place, and time. She has normal strength. Coordination normal. GCS eye subscore is 4. GCS verbal subscore is 5. GCS motor subscore is 6.  Skin: Skin is warm, dry and intact.  Psychiatric: She has a normal mood and affect.    ED Course  Procedures (including critical care time) Labs Review Labs Reviewed - No data to display  Imaging Review No results found. I have personally reviewed and evaluated these images and lab results as part of my medical decision-making.   EKG Interpretation None      MDM   Final diagnoses:  Radiculopathy of cervical region   Findings consistent with a cervical radiculopathy without motor dysfunction. Patient has prior history of cervical disc herniation. At this time she'll be treated with prednisone and hydrocodone. She is advised to follow-up with her family physician this week to determine if referral to her neurosurgeon is needed or if she responds appropriately to anti-inflammatory and pain medication. Patient endorsed some cold symptoms on review of systems. Her lungs are clear and she has no acute evidence of pneumonia or other complications of respiratory illness.    Charlesetta Shanks, MD 01/15/15 1057

## 2015-01-15 NOTE — ED Notes (Signed)
3 Rx's as written by EDP provided to pt, reviewed each prescription with pt, stressed safety while taking pain meds, also discussed importance of following instructions on prednisone as written. Discussed other pain control methods while at home. Also when to follow up with MD such as unable to control pain with meds provided. Opportunity for questions provided

## 2015-02-22 ENCOUNTER — Encounter (HOSPITAL_BASED_OUTPATIENT_CLINIC_OR_DEPARTMENT_OTHER): Payer: Self-pay | Admitting: *Deleted

## 2015-02-22 ENCOUNTER — Emergency Department (HOSPITAL_BASED_OUTPATIENT_CLINIC_OR_DEPARTMENT_OTHER): Payer: No Typology Code available for payment source

## 2015-02-22 ENCOUNTER — Emergency Department (HOSPITAL_BASED_OUTPATIENT_CLINIC_OR_DEPARTMENT_OTHER)
Admission: EM | Admit: 2015-02-22 | Discharge: 2015-02-22 | Disposition: A | Discharge status: Home / Self Care | Payer: No Typology Code available for payment source | Attending: Emergency Medicine | Admitting: Emergency Medicine

## 2015-02-22 DIAGNOSIS — E7800 Pure hypercholesterolemia, unspecified: Secondary | ICD-10-CM | POA: Insufficient documentation

## 2015-02-22 DIAGNOSIS — Y998 Other external cause status: Secondary | ICD-10-CM | POA: Diagnosis not present

## 2015-02-22 DIAGNOSIS — M069 Rheumatoid arthritis, unspecified: Secondary | ICD-10-CM | POA: Diagnosis not present

## 2015-02-22 DIAGNOSIS — Y9289 Other specified places as the place of occurrence of the external cause: Secondary | ICD-10-CM | POA: Insufficient documentation

## 2015-02-22 DIAGNOSIS — I10 Essential (primary) hypertension: Secondary | ICD-10-CM | POA: Insufficient documentation

## 2015-02-22 DIAGNOSIS — F329 Major depressive disorder, single episode, unspecified: Secondary | ICD-10-CM | POA: Diagnosis not present

## 2015-02-22 DIAGNOSIS — Z791 Long term (current) use of non-steroidal anti-inflammatories (NSAID): Secondary | ICD-10-CM | POA: Insufficient documentation

## 2015-02-22 DIAGNOSIS — M797 Fibromyalgia: Secondary | ICD-10-CM | POA: Insufficient documentation

## 2015-02-22 DIAGNOSIS — J45909 Unspecified asthma, uncomplicated: Secondary | ICD-10-CM | POA: Insufficient documentation

## 2015-02-22 DIAGNOSIS — Z7951 Long term (current) use of inhaled steroids: Secondary | ICD-10-CM | POA: Insufficient documentation

## 2015-02-22 DIAGNOSIS — Z79899 Other long term (current) drug therapy: Secondary | ICD-10-CM | POA: Diagnosis not present

## 2015-02-22 DIAGNOSIS — Y9389 Activity, other specified: Secondary | ICD-10-CM | POA: Diagnosis not present

## 2015-02-22 DIAGNOSIS — W06XXXA Fall from bed, initial encounter: Secondary | ICD-10-CM | POA: Diagnosis not present

## 2015-02-22 DIAGNOSIS — S7001XA Contusion of right hip, initial encounter: Secondary | ICD-10-CM | POA: Diagnosis not present

## 2015-02-22 DIAGNOSIS — S79911A Unspecified injury of right hip, initial encounter: Secondary | ICD-10-CM | POA: Diagnosis present

## 2015-02-22 DIAGNOSIS — Z3202 Encounter for pregnancy test, result negative: Secondary | ICD-10-CM | POA: Diagnosis not present

## 2015-02-22 HISTORY — DX: Rheumatoid arthritis, unspecified: M06.9

## 2015-02-22 LAB — PREGNANCY, URINE: Preg Test, Ur: NEGATIVE

## 2015-02-22 MED ORDER — TRAMADOL HCL 50 MG PO TABS: 50.0000 mg | ORAL_TABLET | Freq: Four times a day (QID) | ORAL | Status: DC | PRN

## 2015-02-22 MED ORDER — HYDROCODONE-ACETAMINOPHEN 5-325 MG PO TABS
2.0000 | ORAL_TABLET | ORAL | Status: DC | PRN
Start: 2015-02-22 — End: 2015-06-17

## 2015-02-22 NOTE — ED Notes (Signed)
Pt reports pain in right hip since falling out of bed Saturday- reports has been walking with limp

## 2015-02-22 NOTE — Discharge Instructions (Signed)
Ibuprofen 600 mg every 6 hours as needed for pain.  Hydrocodone as prescribed as needed for pain not relieved with ibuprofen.  Follow up with her primary Dr. if not improving in the next week.   Contusion A contusion is a deep bruise. Contusions happen when an injury causes bleeding under the skin. Symptoms of bruising include pain, swelling, and discolored skin. The skin may turn blue, purple, or yellow. HOME CARE   Rest the injured area.  If told, put ice on the injured area.  Put ice in a plastic bag.  Place a towel between your skin and the bag.  Leave the ice on for 20 minutes, 2-3 times per day.  If told, put light pressure (compression) on the injured area using an elastic bandage. Make sure the bandage is not too tight. Remove it and put it back on as told by your doctor.  If possible, raise (elevate) the injured area above the level of your heart while you are sitting or lying down.  Take over-the-counter and prescription medicines only as told by your doctor. GET HELP IF:  Your symptoms do not get better after several days of treatment.  Your symptoms get worse.  You have trouble moving the injured area. GET HELP RIGHT AWAY IF:   You have very bad pain.  You have a loss of feeling (numbness) in a hand or foot.  Your hand or foot turns pale or cold.   This information is not intended to replace advice given to you by your health care provider. Make sure you discuss any questions you have with your health care provider.   Document Released: 08/28/2007 Document Revised: 11/30/2014 Document Reviewed: 07/27/2014 Elsevier Interactive Patient Education [employer].

## 2015-02-22 NOTE — ED Provider Notes (Signed)
CSN: [id number]     Arrival date & time 02/22/15  1518 History   First MD Initiated Contact with Patient 02/22/15 1652     Chief Complaint  Patient presents with  . Hip Pain     (Consider location/radiation/quality/duration/timing/severity/associated sxs/prior Treatment) HPI Comments: Patient is a 49 year old female who presents with complaints of right hip pain. This has been present since falling out of bed several days ago. She is having difficulty ambulating due to the discomfort. She denies any numbness or tingling. She denies any back pain.  Patient is a 49 y.o. female presenting with hip pain. The history is provided by the patient.  Hip Pain This is a new problem. The current episode started yesterday. The problem occurs constantly. The problem has been gradually worsening. The symptoms are aggravated by walking. Nothing relieves the symptoms. She has tried nothing for the symptoms. The treatment provided no relief.    Past Medical History  Diagnosis Date  . Fibromyalgia   . Hypertension   . Depression   . Asthma   . High cholesterol   . RA (rheumatoid arthritis) [facility])    Past Surgical History  Procedure Laterality Date  . Right oophorectomy    . Cervical spine surgery    . Tubal ligation     Family History  Problem Relation Age of Onset  . Diabetes Mother   . Hypertension Mother   . Diabetes Sister   . Hypertension Sister   . Hypertension Brother    Social History  Substance Use Topics  . Smoking status: Never Smoker   . Smokeless tobacco: Never Used  . Alcohol Use: No   OB History    Gravida Para Term Preterm AB TAB SAB Ectopic Multiple Living   5 3 3       3      Review of Systems  All other systems reviewed and are negative.     Allergies  Codeine; Macrodantin; and Septra  Home Medications   Prior to Admission medications   Medication Sig Start Date End Date Taking? Authorizing Provider  citalopram (CELEXA) 40 MG tablet Take 40 mg by mouth  daily.     Yes Historical Provider, MD  cyclobenzaprine (FLEXERIL) 10 MG tablet Take 1 tablet (10 mg total) by mouth 3 (three) times daily as needed for muscle spasms. 12/22/14  Yes Fredia Sorrow, MD  doxepin (SINEQUAN) 100 MG capsule Take 100 mg by mouth daily.     Yes Historical Provider, MD  metoprolol (TOPROL-XL) 50 MG 24 hr tablet Take 50 mg by mouth daily.     Yes Historical Provider, MD  simvastatin (ZOCOR) 40 MG tablet Take 40 mg by mouth at bedtime.     Yes Historical Provider, MD  diazepam (VALIUM) 5 MG tablet Take 1 tablet (5 mg total) by mouth every 6 (six) hours as needed for anxiety (spasms). 10/18/13   Evelina Bucy, MD  fexofenadine (ALLEGRA) 60 MG tablet Take 1 tablet (60 mg total) by mouth 2 (two) times daily. 05/30/13   Malvin Johns, MD  fluticasone (FLONASE) 50 MCG/ACT nasal spray Place 2 sprays into both nostrils daily. 03/26/14   Waynetta Pean, PA-C  HYDROcodone-acetaminophen (NORCO/VICODIN) 5-325 MG tablet Take 1 tablet by mouth every 6 (six) hours as needed for moderate pain or severe pain. 01/15/15   Charlesetta Shanks, MD  naproxen (NAPROSYN) 500 MG tablet Take 1 tablet (500 mg total) by mouth 2 (two) times daily. 12/22/14   Fredia Sorrow, MD  orphenadrine (NORFLEX) 100 MG  tablet Take 1 tablet (100 mg total) by mouth 2 (two) times daily. 01/15/15   Charlesetta Shanks, MD  predniSONE (DELTASONE) 20 MG tablet 3 tabs po day one, then 2 po daily x 4 days 01/15/15   Charlesetta Shanks, MD   BP 148/96 mmHg  Pulse 86  Temp(Src) 98.4 F (36.9 C) (Oral)  Resp 18  Ht 5\' 4"  (1.626 m)  Wt 208 lb (94.348 kg)  BMI 35.69 kg/m2  SpO2 100%  LMP 02/05/2015 Physical Exam  Constitutional: She is oriented to person, place, and time. She appears well-developed and well-nourished. No distress.  HENT:  Head: Normocephalic and atraumatic.  Neck: Normal range of motion. Neck supple.  Musculoskeletal: Normal range of motion.  There is tenderness to palpation over the lateral aspect of the right hip.  There is no obvious deformity. There is no shortening or rotation of the leg. Distal PMS is intact. She has good range of motion, but with some discomfort.  Neurological: She is alert and oriented to person, place, and time.  Skin: Skin is warm and dry. She is not diaphoretic.  Nursing note and vitals reviewed.   ED Course  Procedures (including critical care time) Labs Review Labs Reviewed - No data to display  Imaging Review No results found. I have personally reviewed and evaluated these images and lab results as part of my medical decision-making.   EKG Interpretation None      MDM   Final diagnoses:  None    X-rays are negative for fracture. Will treat with rest, tramadol, and when necessary follow-up with primary Dr. if not improving.    Veryl Speak, MD 02/22/15 [phone]

## 2015-05-04 MED FILL — CITALOPRAM HBR 40 MG TABLET: 40 | 30 days supply | Qty: 30 | Fill #0

## 2015-05-04 MED FILL — AMOXICILLIN 500 MG CAPSULE: 500 | 10 days supply | Qty: 30 | Fill #0

## 2015-05-04 MED FILL — METOPROLOL SUCC ER 50 MG TA: 50 | 30 days supply | Qty: 30 | Fill #0

## 2015-05-04 MED FILL — predniSONE 20 MG TABS: 20 | 5 days supply | Qty: 10 | Fill #0

## 2015-05-04 MED FILL — SIMVASTATIN 40 MG TABLET: 40 | 30 days supply | Qty: 30 | Fill #0

## 2015-06-17 ENCOUNTER — Encounter (HOSPITAL_BASED_OUTPATIENT_CLINIC_OR_DEPARTMENT_OTHER): Payer: Self-pay | Admitting: Emergency Medicine

## 2015-06-17 ENCOUNTER — Emergency Department (HOSPITAL_BASED_OUTPATIENT_CLINIC_OR_DEPARTMENT_OTHER)
Admission: EM | Admit: 2015-06-17 | Discharge: 2015-06-17 | Disposition: A | Discharge status: Home / Self Care | Payer: PRIVATE HEALTH INSURANCE | Attending: Emergency Medicine | Admitting: Emergency Medicine

## 2015-06-17 DIAGNOSIS — Z791 Long term (current) use of non-steroidal anti-inflammatories (NSAID): Secondary | ICD-10-CM | POA: Insufficient documentation

## 2015-06-17 DIAGNOSIS — M79605 Pain in left leg: Secondary | ICD-10-CM | POA: Diagnosis present

## 2015-06-17 DIAGNOSIS — M79662 Pain in left lower leg: Secondary | ICD-10-CM

## 2015-06-17 DIAGNOSIS — Z79899 Other long term (current) drug therapy: Secondary | ICD-10-CM | POA: Diagnosis not present

## 2015-06-17 DIAGNOSIS — Z7982 Long term (current) use of aspirin: Secondary | ICD-10-CM | POA: Diagnosis not present

## 2015-06-17 DIAGNOSIS — M069 Rheumatoid arthritis, unspecified: Secondary | ICD-10-CM | POA: Insufficient documentation

## 2015-06-17 DIAGNOSIS — M797 Fibromyalgia: Secondary | ICD-10-CM | POA: Diagnosis not present

## 2015-06-17 DIAGNOSIS — J45909 Unspecified asthma, uncomplicated: Secondary | ICD-10-CM | POA: Diagnosis not present

## 2015-06-17 DIAGNOSIS — F329 Major depressive disorder, single episode, unspecified: Secondary | ICD-10-CM | POA: Diagnosis not present

## 2015-06-17 DIAGNOSIS — E7800 Pure hypercholesterolemia, unspecified: Secondary | ICD-10-CM | POA: Insufficient documentation

## 2015-06-17 DIAGNOSIS — Z7951 Long term (current) use of inhaled steroids: Secondary | ICD-10-CM | POA: Insufficient documentation

## 2015-06-17 DIAGNOSIS — I10 Essential (primary) hypertension: Secondary | ICD-10-CM | POA: Diagnosis not present

## 2015-06-17 DIAGNOSIS — M25562 Pain in left knee: Secondary | ICD-10-CM | POA: Diagnosis not present

## 2015-06-17 MED ORDER — IBUPROFEN 600 MG PO TABS: 600.0000 mg | ORAL_TABLET | Freq: Four times a day (QID) | ORAL | Status: DC | PRN

## 2015-06-17 NOTE — ED Notes (Signed)
Pt c/o left leg pain radiating from posterior left calf throughout posterior upper leg.  Pt states pain unchanged with position and no known injury.

## 2015-06-17 NOTE — ED Provider Notes (Signed)
CSN: [id number]     Arrival date & time 06/17/15  1138 History   First MD Initiated Contact with Patient 06/17/15 1154     Chief Complaint  Patient presents with  . Leg Pain     (Consider location/radiation/quality/duration/timing/severity/associated sxs/prior Treatment) Patient is a 50 y.o. female presenting with leg pain. The history is provided by the patient.  Leg Pain Location:  Leg and knee Time since incident:  2 days Injury: no   Leg location:  L lower leg Knee location:  L knee Pain details:    Quality:  Aching   Radiates to:  Does not radiate   Severity:  Mild   Onset quality:  Gradual   Timing:  Constant   Progression:  Unchanged Chronicity:  New Dislocation: no   Foreign body present:  No foreign bodies Prior injury to area:  No Relieved by:  Movement Worsened by:  Nothing tried Ineffective treatments:  None tried Associated symptoms: no decreased ROM, no fever, no muscle weakness, no numbness and no swelling     Past Medical History  Diagnosis Date  . Fibromyalgia   . Hypertension   . Depression   . Asthma   . High cholesterol   . RA (rheumatoid arthritis) [facility])    Past Surgical History  Procedure Laterality Date  . Right oophorectomy    . Cervical spine surgery    . Tubal ligation     Family History  Problem Relation Age of Onset  . Diabetes Mother   . Hypertension Mother   . Diabetes Sister   . Hypertension Sister   . Hypertension Brother    Social History  Substance Use Topics  . Smoking status: Never Smoker   . Smokeless tobacco: Never Used  . Alcohol Use: No   OB History    Gravida Para Term Preterm AB TAB SAB Ectopic Multiple Living   5 3 3       3      Review of Systems  Constitutional: Negative for fever.  All other systems reviewed and are negative.     Allergies  Codeine; Macrodantin; and Septra  Home Medications   Prior to Admission medications   Medication Sig Start Date End Date Taking? Authorizing Provider   aspirin 81 MG tablet Take 81 mg by mouth daily.   Yes Historical Provider, MD  citalopram (CELEXA) 40 MG tablet Take 40 mg by mouth daily.     Yes Historical Provider, MD  doxepin (SINEQUAN) 100 MG capsule Take 100 mg by mouth daily.     Yes Historical Provider, MD  fexofenadine (ALLEGRA) 60 MG tablet Take 1 tablet (60 mg total) by mouth 2 (two) times daily. 05/30/13  Yes Malvin Johns, MD  metoprolol (TOPROL-XL) 50 MG 24 hr tablet Take 50 mg by mouth daily.     Yes Historical Provider, MD  simvastatin (ZOCOR) 40 MG tablet Take 40 mg by mouth at bedtime.     Yes Historical Provider, MD  cyclobenzaprine (FLEXERIL) 10 MG tablet Take 1 tablet (10 mg total) by mouth 3 (three) times daily as needed for muscle spasms. 12/22/14   Fredia Sorrow, MD  diazepam (VALIUM) 5 MG tablet Take 1 tablet (5 mg total) by mouth every 6 (six) hours as needed for anxiety (spasms). 10/18/13   Evelina Bucy, MD  fluticasone [facility]) 50 MCG/ACT nasal spray Place 2 sprays into both nostrils daily. 03/26/14   Waynetta Pean, PA-C  HYDROcodone-acetaminophen (NORCO) 5-325 MG tablet Take 2 tablets by mouth every 4 (  four) hours as needed. 02/22/15   Veryl Speak, MD  naproxen (NAPROSYN) 500 MG tablet Take 1 tablet (500 mg total) by mouth 2 (two) times daily. 12/22/14   Fredia Sorrow, MD  orphenadrine (NORFLEX) 100 MG tablet Take 1 tablet (100 mg total) by mouth 2 (two) times daily. 01/15/15   Charlesetta Shanks, MD  predniSONE (DELTASONE) 20 MG tablet 3 tabs po day one, then 2 po daily x 4 days 01/15/15   Charlesetta Shanks, MD   BP 146/93 mmHg  Pulse 102  Temp(Src) 99 F (37.2 C) (Oral)  Resp 18  Ht 5\' 4"  (1.626 m)  Wt 176 lb (79.833 kg)  BMI 30.20 kg/m2  SpO2 98%  LMP 06/10/2015 (Approximate) Physical Exam  Constitutional: She is oriented to person, place, and time. She appears well-developed and well-nourished. No distress.  HENT:  Head: Normocephalic.  Eyes: Conjunctivae are normal.  Neck: Neck supple. No tracheal deviation  present.  Cardiovascular: Normal rate and regular rhythm.   Pulmonary/Chest: Effort normal. No respiratory distress.  Abdominal: Soft. She exhibits no distension.  Musculoskeletal:       Left knee: She exhibits normal range of motion, no swelling, no effusion, no LCL laxity, normal patellar mobility and no MCL laxity. Tenderness (minimal inferior to lateral joint line) found.  Neurological: She is alert and oriented to person, place, and time.  Skin: Skin is warm and dry.  Psychiatric: She has a normal mood and affect.    ED Course  Procedures (including critical care time) Labs Review Labs Reviewed - No data to display  Imaging Review No results found. I have personally reviewed and evaluated these images and lab results as part of my medical decision-making.   EKG Interpretation None      MDM   Final diagnoses:  Pain of left lower leg    50 year female presents with left lateral knee pain that radiates down to her lateral calf on the same side. No history of injury. Mildly tender to palpation without any swelling, no ligamentous or meniscal abnormalities elicited on exam. No signs of DVT. Pt given instructions for supportive care including NSAIDs, rest, ice, compression, and elevation to help alleviate symptoms.     Leo Grosser, MD 06/17/15 1256

## 2015-06-17 NOTE — Discharge Instructions (Signed)

## 2015-06-27 MED FILL — SIMVASTATIN 40 MG TABLET: 40 | 30 days supply | Qty: 30 | Fill #0

## 2015-06-27 MED FILL — raNITIdine HCL 150 MG TABS: 150 | 30 days supply | Qty: 30 | Fill #0

## 2015-06-27 MED FILL — ASPIR-LOW 81 MG TABLET EC: 81 | 120 days supply | Qty: 120 | Fill #0

## 2015-06-27 MED FILL — CITALOPRAM HBR 40 MG TABLET: 40 | 30 days supply | Qty: 30 | Fill #0

## 2015-06-27 MED FILL — METOPROLOL SUCC ER 50 MG TA: 50 | 30 days supply | Qty: 30 | Fill #0

## 2015-07-27 MED FILL — METOPROLOL SUCC ER 50 MG TA: 50 | 30 days supply | Qty: 30 | Fill #1

## 2015-07-27 MED FILL — SIMVASTATIN 40 MG TABLET: 40 | 30 days supply | Qty: 30 | Fill #1

## 2015-07-27 MED FILL — CITALOPRAM HBR 40 MG TABLET: 40 | 30 days supply | Qty: 30 | Fill #1

## 2015-07-27 MED FILL — raNITIdine HCL 150 MG TABS: 150 | 30 days supply | Qty: 30 | Fill #1

## 2015-08-01 MED FILL — AMOX-CLAV 875-125 MG TABLET: 875-125 | 10 days supply | Qty: 20 | Fill #0

## 2015-08-01 MED FILL — LORATADINE 10 MG TABLET: 10 | 100 days supply | Qty: 100 | Fill #0

## 2015-08-01 MED FILL — predniSONE 20 MG TABS: 20 | 7 days supply | Qty: 10 | Fill #0

## 2015-08-28 MED FILL — SIMVASTATIN 40 MG TABLET: 40 | 30 days supply | Qty: 30 | Fill #2

## 2015-08-28 MED FILL — CITALOPRAM HBR 40 MG TABLET: 40 | 30 days supply | Qty: 30 | Fill #2

## 2015-08-28 MED FILL — raNITIdine HCL 150 MG TABS: 150 | 30 days supply | Qty: 30 | Fill #2

## 2015-08-28 MED FILL — METOPROLOL SUCC ER 50 MG TA: 50 | 30 days supply | Qty: 30 | Fill #2

## 2015-08-30 ENCOUNTER — Encounter (HOSPITAL_BASED_OUTPATIENT_CLINIC_OR_DEPARTMENT_OTHER): Payer: Self-pay

## 2015-08-30 ENCOUNTER — Emergency Department (HOSPITAL_BASED_OUTPATIENT_CLINIC_OR_DEPARTMENT_OTHER)
Admission: EM | Admit: 2015-08-30 | Discharge: 2015-08-30 | Disposition: A | Discharge status: Home / Self Care | Payer: PRIVATE HEALTH INSURANCE | Attending: Emergency Medicine | Admitting: Emergency Medicine

## 2015-08-30 DIAGNOSIS — Z79899 Other long term (current) drug therapy: Secondary | ICD-10-CM | POA: Diagnosis not present

## 2015-08-30 DIAGNOSIS — M5441 Lumbago with sciatica, right side: Secondary | ICD-10-CM | POA: Diagnosis not present

## 2015-08-30 DIAGNOSIS — I10 Essential (primary) hypertension: Secondary | ICD-10-CM | POA: Diagnosis not present

## 2015-08-30 DIAGNOSIS — Z7982 Long term (current) use of aspirin: Secondary | ICD-10-CM | POA: Diagnosis not present

## 2015-08-30 DIAGNOSIS — F329 Major depressive disorder, single episode, unspecified: Secondary | ICD-10-CM | POA: Insufficient documentation

## 2015-08-30 DIAGNOSIS — M545 Low back pain: Secondary | ICD-10-CM | POA: Diagnosis present

## 2015-08-30 DIAGNOSIS — M5431 Sciatica, right side: Secondary | ICD-10-CM

## 2015-08-30 DIAGNOSIS — J45909 Unspecified asthma, uncomplicated: Secondary | ICD-10-CM | POA: Insufficient documentation

## 2015-08-30 MED ORDER — ACETAMINOPHEN 500 MG PO TABS
1000.0000 mg | ORAL_TABLET | Freq: Once | ORAL | Status: AC
  Administered 2015-08-30: 1000 mg via ORAL
  Filled 2015-08-30: qty 2

## 2015-08-30 MED ORDER — DIAZEPAM 2 MG PO TABS: 2.0000 mg | ORAL_TABLET | Freq: Four times a day (QID) | ORAL | Status: DC | PRN

## 2015-08-30 MED ORDER — KETOROLAC TROMETHAMINE 60 MG/2ML IM SOLN
60.0000 mg | Freq: Once | INTRAMUSCULAR | Status: AC
  Administered 2015-08-30: 60 mg via INTRAMUSCULAR
  Filled 2015-08-30: qty 2

## 2015-08-30 MED FILL — diazePAM 2 MG TABS: 2 | 2 days supply | Qty: 7 | Fill #0

## 2015-08-30 NOTE — ED Provider Notes (Signed)
CSN: [id number]     Arrival date & time 08/30/15  1338 History   First MD Initiated Contact with Patient 08/30/15 1359     Chief Complaint  Patient presents with  . Fall  . Hip Pain     (Consider location/radiation/quality/duration/timing/severity/associated sxs/prior Treatment) Patient is a 50 y.o. female presenting with fall and hip pain. The history is provided by the patient.  Fall This is a new problem. The current episode started 6 to 12 hours ago. The problem occurs rarely. The problem has not changed since onset.Pertinent negatives include no chest pain, no headaches and no shortness of breath. The symptoms are aggravated by twisting and bending. Nothing relieves the symptoms. She has tried nothing for the symptoms.  Hip Pain Pertinent negatives include no chest pain, no headaches and no shortness of breath.   50 yo F With a chief complaint of low back pain. This was hurt while at work. She reinjured it when she fell out of the bed this morning. Landed on the lateral aspect of her leg. Having pain at the SI joints that moves mildly down the leg.  Past Medical History  Diagnosis Date  . Fibromyalgia   . Hypertension   . Depression   . Asthma   . High cholesterol   . RA (rheumatoid arthritis) [facility])    Past Surgical History  Procedure Laterality Date  . Right oophorectomy    . Cervical spine surgery    . Tubal ligation     Family History  Problem Relation Age of Onset  . Diabetes Mother   . Hypertension Mother   . Diabetes Sister   . Hypertension Sister   . Hypertension Brother    Social History  Substance Use Topics  . Smoking status: Never Smoker   . Smokeless tobacco: Never Used  . Alcohol Use: No   OB History    Gravida Para Term Preterm AB TAB SAB Ectopic Multiple Living   5 3 3       3      Review of Systems  Constitutional: Negative for fever and chills.  HENT: Negative for congestion and rhinorrhea.   Eyes: Negative for redness and visual  disturbance.  Respiratory: Negative for shortness of breath and wheezing.   Cardiovascular: Negative for chest pain and palpitations.  Gastrointestinal: Negative for nausea and vomiting.  Genitourinary: Negative for dysuria and urgency.  Musculoskeletal: Positive for myalgias and arthralgias.  Skin: Negative for pallor and wound.  Neurological: Negative for dizziness and headaches.      Allergies  Codeine; Macrodantin; and Septra  Home Medications   Prior to Admission medications   Medication Sig Start Date End Date Taking? Authorizing Provider  aspirin 81 MG tablet Take 81 mg by mouth daily.    Historical Provider, MD  citalopram (CELEXA) 40 MG tablet Take 40 mg by mouth daily.      Historical Provider, MD  diazepam (VALIUM) 2 MG tablet Take 1 tablet (2 mg total) by mouth every 6 (six) hours as needed for anxiety (spasms). 08/30/15   Deno Etienne, DO  doxepin (SINEQUAN) 100 MG capsule Take 100 mg by mouth daily.      Historical Provider, MD  fexofenadine (ALLEGRA) 60 MG tablet Take 1 tablet (60 mg total) by mouth 2 (two) times daily. 05/30/13   Malvin Johns, MD  ibuprofen (ADVIL,MOTRIN) 600 MG tablet Take 1 tablet (600 mg total) by mouth every 6 (six) hours as needed. 06/17/15   Leo Grosser, MD  metoprolol (TOPROL-XL)  50 MG 24 hr tablet Take 50 mg by mouth daily.      Historical Provider, MD  simvastatin (ZOCOR) 40 MG tablet Take 40 mg by mouth at bedtime.      Historical Provider, MD   BP 144/83 mmHg  Pulse 75  Temp(Src) 98.4 F (36.9 C) (Oral)  Resp 18  Ht 5\' 3"  (1.6 m)  Wt 180 lb (81.647 kg)  BMI 31.89 kg/m2  SpO2 98%  LMP 08/07/2015 Physical Exam  Constitutional: She is oriented to person, place, and time. She appears well-developed and well-nourished. No distress.  HENT:  Head: Normocephalic and atraumatic.  Eyes: EOM are normal. Pupils are equal, round, and reactive to light.  Neck: Normal range of motion. Neck supple.  Cardiovascular: Normal rate and regular rhythm.   Exam reveals no gallop and no friction rub.   No murmur heard. Pulmonary/Chest: Effort normal. She has no wheezes. She has no rales.  Abdominal: Soft. She exhibits no distension. There is no tenderness. There is no rebound and no guarding.  Musculoskeletal: She exhibits tenderness. She exhibits no edema.  Tender to palpation about the right SI joint. Negative straight leg raise test. Pulse motor and sensation intact distally.  Neurological: She is alert and oriented to person, place, and time.  Skin: Skin is warm and dry. She is not diaphoretic.  Psychiatric: She has a normal mood and affect. Her behavior is normal.  Nursing note and vitals reviewed.   ED Course  Procedures (including critical care time) Labs Review Labs Reviewed - No data to display  Imaging Review No results found. I have personally reviewed and evaluated these images and lab results as part of my medical decision-making.   EKG Interpretation None      MDM   Final diagnoses:  Sciatica of right side    50 yo F with a chief complaint of left lower back pain. No red flags. Treat symptomatically. NSAIDs PCP follow up.\  3:17 PM:  I have discussed the diagnosis/risks/treatment options with the patient and believe the pt to be eligible for discharge home to follow-up with PCP. We also discussed returning to the ED immediately if new or worsening sx occur. We discussed the sx which are most concerning (e.g., sudden worsening pain, fever, inability to tolerate by mouth) that necessitate immediate return. Medications administered to the patient during their visit and any new prescriptions provided to the patient are listed below.  Medications given during this visit Medications  ketorolac (TORADOL) injection 60 mg (60 mg Intramuscular Given 08/30/15 1430)  acetaminophen (TYLENOL) tablet 1,000 mg (1,000 mg Oral Given 08/30/15 1430)    Discharge Medication List as of 08/30/2015  2:20 PM    START taking these medications    Details  diazepam (VALIUM) 2 MG tablet Take 1 tablet (2 mg total) by mouth every 6 (six) hours as needed for anxiety (spasms)., Starting 08/30/2015, Until Discontinued, Print        The patient appears reasonably screen and/or stabilized for discharge and I doubt any other medical condition or other [facility] requiring further screening, evaluation, or treatment in the ED at this time prior to discharge.      Deno Etienne, DO 08/30/15 1517

## 2015-08-30 NOTE — ED Notes (Signed)
Patient complains of right buttocks pain radiating down right hip/thigh to knee started on Saturday. Last pm fell out of bed landing on right side causing more pain. Pain worse with ambulation

## 2015-08-30 NOTE — Discharge Instructions (Signed)
Take 4 over the counter ibuprofen tablets 3 times a day or 2 over-the-counter naproxen tablets twice a day for pain.  Sciatica Sciatica is pain, weakness, numbness, or tingling along your sciatic nerve. The nerve starts in the lower back and runs down the back of each leg. Nerve damage or certain conditions pinch or put pressure on the sciatic nerve. This causes the pain, weakness, and other discomforts of sciatica. HOME CARE   Only take medicine as told by your doctor.  Apply ice to the affected area for 20 minutes. Do this 3-4 times a day for the first 48-72 hours. Then try heat in the same way.  Exercise, stretch, or do your usual activities if these do not make your pain worse.  Go to physical therapy as told by your doctor.  Keep all doctor visits as told.  Do not wear high heels or shoes that are not supportive.  Get a firm mattress if your mattress is too soft to lessen pain and discomfort. GET HELP RIGHT AWAY IF:   You cannot control when you poop (bowel movement) or pee (urinate).  You have more weakness in your lower back, lower belly (pelvis), butt (buttocks), or legs.  You have redness or puffiness (swelling) of your back.  You have a burning feeling when you pee.  You have pain that gets worse when you lie down.  You have pain that wakes you from your sleep.  Your pain is worse than past pain.  Your pain lasts longer than 4 weeks.  You are suddenly losing weight without reason. MAKE SURE YOU:   Understand these instructions.  Will watch this condition.  Will get help right away if you are not doing well or get worse.   This information is not intended to replace advice given to you by your health care provider. Make sure you discuss any questions you have with your health care provider.   Document Released: 12/19/2007 Document Revised: 11/30/2014 Document Reviewed: 07/21/2011 Elsevier Interactive Patient Education [employer].

## 2015-09-28 MED FILL — raNITIdine HCL 150 MG TABS: 150 | 30 days supply | Qty: 30 | Fill #3

## 2015-09-28 MED FILL — CITALOPRAM HBR 40 MG TABLET: 40 | 30 days supply | Qty: 30 | Fill #3

## 2015-09-28 MED FILL — SIMVASTATIN 40 MG TABLET: 40 | 30 days supply | Qty: 30 | Fill #3

## 2015-09-28 MED FILL — METOPROLOL SUCC ER 50 MG TA: 50 | 30 days supply | Qty: 30 | Fill #3

## 2015-10-30 MED FILL — CITALOPRAM HBR 40 MG TABLET: 40 | 30 days supply | Qty: 30 | Fill #4

## 2015-10-30 MED FILL — METOPROLOL SUCC ER 50 MG TA: 50 | 30 days supply | Qty: 30 | Fill #4

## 2015-10-30 MED FILL — raNITIdine HCL 150 MG TABS: 150 | 30 days supply | Qty: 30 | Fill #4

## 2015-11-24 MED FILL — DOXEPIN 100 MG CAPSULE: 100 | 30 days supply | Qty: 30 | Fill #0

## 2015-11-24 MED FILL — SIMVASTATIN 40 MG TABLET: 40 | 30 days supply | Qty: 30 | Fill #4

## 2015-12-01 MED FILL — METOPROLOL SUCC ER 50 MG TA: 50 | 30 days supply | Qty: 30 | Fill #5

## 2015-12-01 MED FILL — CITALOPRAM HBR 40 MG TABLET: 40 | 30 days supply | Qty: 30 | Fill #5

## 2015-12-06 ENCOUNTER — Encounter (HOSPITAL_BASED_OUTPATIENT_CLINIC_OR_DEPARTMENT_OTHER): Payer: Self-pay | Admitting: *Deleted

## 2015-12-06 ENCOUNTER — Emergency Department (HOSPITAL_BASED_OUTPATIENT_CLINIC_OR_DEPARTMENT_OTHER)
Admission: EM | Admit: 2015-12-06 | Discharge: 2015-12-06 | Disposition: A | Discharge status: Home / Self Care | Payer: No Typology Code available for payment source | Attending: Emergency Medicine | Admitting: Emergency Medicine

## 2015-12-06 DIAGNOSIS — I10 Essential (primary) hypertension: Secondary | ICD-10-CM | POA: Insufficient documentation

## 2015-12-06 DIAGNOSIS — Z79899 Other long term (current) drug therapy: Secondary | ICD-10-CM | POA: Insufficient documentation

## 2015-12-06 DIAGNOSIS — H109 Unspecified conjunctivitis: Secondary | ICD-10-CM

## 2015-12-06 DIAGNOSIS — R0981 Nasal congestion: Secondary | ICD-10-CM | POA: Diagnosis present

## 2015-12-06 DIAGNOSIS — Z7982 Long term (current) use of aspirin: Secondary | ICD-10-CM | POA: Insufficient documentation

## 2015-12-06 DIAGNOSIS — J069 Acute upper respiratory infection, unspecified: Secondary | ICD-10-CM | POA: Diagnosis not present

## 2015-12-06 DIAGNOSIS — B9789 Other viral agents as the cause of diseases classified elsewhere: Secondary | ICD-10-CM

## 2015-12-06 DIAGNOSIS — J45909 Unspecified asthma, uncomplicated: Secondary | ICD-10-CM | POA: Insufficient documentation

## 2015-12-06 MED ORDER — ERYTHROMYCIN 5 MG/GM OP OINT: TOPICAL_OINTMENT | OPHTHALMIC | 0 refills | Status: DC

## 2015-12-06 MED FILL — ERYTHROMYCIN EYE OINTMENT: 5 | 5 days supply | Qty: 4 | Fill #0

## 2015-12-06 NOTE — ED Provider Notes (Signed)
[place] DEPT [facility] Provider Note   CSN: [id number] Arrival date & time: 12/06/15  0743     History   Chief Complaint Chief Complaint  Patient presents with  . Nasal Congestion  . Eye Problem    HPI Katherine Wiggins is a 50 y.o. female.  HPI  50 year old female presents with congestion since yesterday. Started noticing bilateral eye swelling, right worse than left, nasal congestion, and facial swelling and pain. No fevers. She has had some aching in her joints. Has also had a nonproductive cough without dyspnea. Denies headaches, neck pain or swelling, sore throat. Has had a little bit of right ear pain as well. Tried over-the-counter sinus medicine with Tylenol with no relief. No blurry vision or ocular pain. Has had some clear matting when she woke up this morning.  Past Medical History:  Diagnosis Date  . Asthma   . Depression   . Fibromyalgia   . High cholesterol   . Hypertension   . RA (rheumatoid arthritis) ([facility])     There are no active problems to display for this patient.   Past Surgical History:  Procedure Laterality Date  . CERVICAL SPINE SURGERY    . RIGHT OOPHORECTOMY    . TUBAL LIGATION      OB History    Gravida Para Term Preterm AB Living   5 3 3     3    SAB TAB Ectopic Multiple Live Births                   Home Medications    Prior to Admission medications   Medication Sig Start Date End Date Taking? Authorizing Provider  aspirin 81 MG tablet Take 81 mg by mouth daily.   Yes Historical Provider, MD  citalopram (CELEXA) 40 MG tablet Take 40 mg by mouth daily.     Yes Historical Provider, MD  doxepin (SINEQUAN) 100 MG capsule Take 100 mg by mouth daily.     Yes Historical Provider, MD  metoprolol (TOPROL-XL) 50 MG 24 hr tablet Take 50 mg by mouth daily.     Yes Historical Provider, MD  simvastatin (ZOCOR) 40 MG tablet Take 40 mg by mouth at bedtime.     Yes Historical Provider, MD  diazepam (VALIUM) 2 MG tablet Take 1 tablet (2 mg total)  by mouth every 6 (six) hours as needed for anxiety (spasms). 08/30/15   Deno Etienne, DO  erythromycin ophthalmic ointment Place a 1/2 inch ribbon of ointment into the lower eyelid 4 times per day for 5 days 12/06/15   Sherwood Gambler, MD  fexofenadine (ALLEGRA) 60 MG tablet Take 1 tablet (60 mg total) by mouth 2 (two) times daily. 05/30/13   Malvin Johns, MD  ibuprofen (ADVIL,MOTRIN) 600 MG tablet Take 1 tablet (600 mg total) by mouth every 6 (six) hours as needed. 06/17/15   Leo Grosser, MD    Family History Family History  Problem Relation Age of Onset  . Diabetes Mother   . Hypertension Mother   . Diabetes Sister   . Hypertension Sister   . Hypertension Brother     Social History Social History  Substance Use Topics  . Smoking status: Never Smoker  . Smokeless tobacco: Never Used  . Alcohol use No     Allergies   Codeine; Macrodantin [nitrofurantoin]; and Septra [sulfamethoxazole w/trimethoprim (co-trimoxazole)]   Review of Systems Review of Systems  Constitutional: Negative for fever.  HENT: Positive for congestion, facial swelling and sinus pressure. Negative for rhinorrhea  and sore throat.   Eyes: Positive for redness and itching. Negative for visual disturbance.  Respiratory: Positive for cough. Negative for shortness of breath.   Musculoskeletal: Positive for arthralgias.  Neurological: Negative for headaches.  All other systems reviewed and are negative.    Physical Exam Updated Vital Signs BP 139/77 (BP Location: Left Arm)   Pulse 74   Temp 97.7 F (36.5 C) (Oral)   Resp 18   Ht 5\' 3"  (1.6 m)   Wt 208 lb (94.3 kg)   LMP 11/27/2015   SpO2 98%   BMI 36.85 kg/m   Physical Exam  Constitutional: She is oriented to person, place, and time. She appears well-developed and well-nourished.  HENT:  Head: Normocephalic and atraumatic.  Right Ear: Tympanic membrane, external ear and ear canal normal.  Left Ear: Tympanic membrane, external ear and ear canal normal.    Nose: Right sinus exhibits maxillary sinus tenderness (mild) and frontal sinus tenderness (mild). Left sinus exhibits maxillary sinus tenderness (mild) and frontal sinus tenderness (mild).  Mouth/Throat: Oropharynx is clear and moist. No oropharyngeal exudate.  Eyes: EOM are normal. Pupils are equal, round, and reactive to light. Right eye exhibits no discharge. Left eye exhibits no discharge. Right conjunctiva is injected.  Neck: Neck supple.  Cardiovascular: Normal rate, regular rhythm and normal heart sounds.   Pulmonary/Chest: Effort normal and breath sounds normal.  Abdominal: She exhibits no distension.  Lymphadenopathy:    She has no cervical adenopathy.  Neurological: She is alert and oriented to person, place, and time.  Skin: Skin is warm and dry.  Nursing note and vitals reviewed.    ED Treatments / Results  Labs (all labs ordered are listed, but only abnormal results are displayed) Labs Reviewed - No data to display  EKG  EKG Interpretation None       Radiology No results found.  Procedures Procedures (including critical care time)  Medications Ordered in ED Medications - No data to display   Initial Impression / Assessment and Plan / ED Course  I have reviewed the triage vital signs and the nursing notes.  Pertinent labs & imaging results that were available during my care of the patient were reviewed by me and considered in my medical decision making (see chart for details).  Clinical Course    Patient appears to have a viral upper respiratory/sinus infection. Highly doubt acute bacterial pneumonia or bacterial sinusitis based on current presentation. She does have asymmetric conjunctivitis, will cover with erythromycin but probably this is viral. Discussed return precautions and other over-the-counter treatments for symptom control.  Final Clinical Impressions(s) / ED Diagnoses   Final diagnoses:  Viral URI with cough  Conjunctivitis, right eye     New Prescriptions New Prescriptions   ERYTHROMYCIN OPHTHALMIC OINTMENT    Place a 1/2 inch ribbon of ointment into the lower eyelid 4 times per day for 5 days     Sherwood Gambler, MD 12/06/15 [phone]

## 2015-12-06 NOTE — ED Notes (Signed)
MD at bedside. 

## 2015-12-06 NOTE — ED Notes (Signed)
Pt directed to pharmacy to pick up RX. Note for work given

## 2015-12-06 NOTE — ED Triage Notes (Signed)
Cough, congestion, right eye red and right ear pain x 1 day. Sent from work for evaluation

## 2015-12-15 ENCOUNTER — Emergency Department (HOSPITAL_BASED_OUTPATIENT_CLINIC_OR_DEPARTMENT_OTHER)
Admission: EM | Admit: 2015-12-15 | Discharge: 2015-12-15 | Disposition: A | Discharge status: Home / Self Care | Payer: No Typology Code available for payment source | Attending: Emergency Medicine | Admitting: Emergency Medicine

## 2015-12-15 ENCOUNTER — Emergency Department (HOSPITAL_BASED_OUTPATIENT_CLINIC_OR_DEPARTMENT_OTHER): Payer: No Typology Code available for payment source

## 2015-12-15 ENCOUNTER — Encounter (HOSPITAL_BASED_OUTPATIENT_CLINIC_OR_DEPARTMENT_OTHER): Payer: Self-pay

## 2015-12-15 DIAGNOSIS — J45909 Unspecified asthma, uncomplicated: Secondary | ICD-10-CM | POA: Insufficient documentation

## 2015-12-15 DIAGNOSIS — M436 Torticollis: Secondary | ICD-10-CM

## 2015-12-15 DIAGNOSIS — I10 Essential (primary) hypertension: Secondary | ICD-10-CM | POA: Diagnosis not present

## 2015-12-15 DIAGNOSIS — R51 Headache: Secondary | ICD-10-CM | POA: Diagnosis not present

## 2015-12-15 DIAGNOSIS — M542 Cervicalgia: Secondary | ICD-10-CM | POA: Diagnosis present

## 2015-12-15 MED ORDER — IBUPROFEN 800 MG PO TABS: 800.0000 mg | ORAL_TABLET | Freq: Three times a day (TID) | ORAL | 0 refills | Status: DC

## 2015-12-15 MED ORDER — CYCLOBENZAPRINE HCL 10 MG PO TABS: 10.0000 mg | ORAL_TABLET | Freq: Two times a day (BID) | ORAL | 0 refills | Status: DC | PRN

## 2015-12-15 MED ORDER — KETOROLAC TROMETHAMINE 60 MG/2ML IM SOLN
60.0000 mg | Freq: Once | INTRAMUSCULAR | Status: AC
  Administered 2015-12-15: 60 mg via INTRAMUSCULAR
  Filled 2015-12-15: qty 2

## 2015-12-15 NOTE — ED Notes (Signed)
Patient transported to X-ray 

## 2015-12-15 NOTE — ED Provider Notes (Signed)
[place] DEPT [facility] Provider Note   CSN: [id number] Arrival date & time: 12/15/15  1405     History   Chief Complaint Chief Complaint  Patient presents with  . Neck Pain    HPI Katherine Wiggins is a 50 y.o. female.  The history is provided by the patient. No language interpreter was used.  Neck Pain      Katherine Wiggins is a 50 y.o. female who presents to the Emergency Department complaining of neck pain.  She reports 3 days of right-sided neck pain. The pain is on her lateral and posterior right neck. She feels that there is spasm in her neck muscles. She has associated headache as well as a sensation of "rolling" down her arm. She denies any fevers, chest pain, nausea, vomiting, vision changes, numbness, weakness. She has a history of surgery to her left neck as well as rheumatoid arthritis.  Past Medical History:  Diagnosis Date  . Asthma   . Depression   . Fibromyalgia   . High cholesterol   . Hypertension   . RA (rheumatoid arthritis) (HCC)     There are no active problems to display for this patient.   Past Surgical History:  Procedure Laterality Date  . CERVICAL SPINE SURGERY    . RIGHT OOPHORECTOMY    . TUBAL LIGATION      OB History    Gravida Para Term Preterm AB Living   5 3 3     3    SAB TAB Ectopic Multiple Live Births                   Home Medications    Prior to Admission medications   Medication Sig Start Date End Date Taking? Authorizing Provider  aspirin 81 MG tablet Take 81 mg by mouth daily.    Historical Provider, MD  citalopram (CELEXA) 40 MG tablet Take 40 mg by mouth daily.      Historical Provider, MD  cyclobenzaprine (FLEXERIL) 10 MG tablet Take 1 tablet (10 mg total) by mouth 2 (two) times daily as needed for muscle spasms. 12/15/15   Quintella Reichert, MD  diazepam (VALIUM) 2 MG tablet Take 1 tablet (2 mg total) by mouth every 6 (six) hours as needed for anxiety (spasms). 08/30/15   Deno Etienne, DO  doxepin (SINEQUAN) 100 MG  capsule Take 100 mg by mouth daily.      Historical Provider, MD  erythromycin ophthalmic ointment Place a 1/2 inch ribbon of ointment into the lower eyelid 4 times per day for 5 days 12/06/15   Sherwood Gambler, MD  fexofenadine (ALLEGRA) 60 MG tablet Take 1 tablet (60 mg total) by mouth 2 (two) times daily. 05/30/13   Malvin Johns, MD  ibuprofen (ADVIL,MOTRIN) 800 MG tablet Take 1 tablet (800 mg total) by mouth 3 (three) times daily. 12/15/15   Quintella Reichert, MD  metoprolol (TOPROL-XL) 50 MG 24 hr tablet Take 50 mg by mouth daily.      Historical Provider, MD  simvastatin (ZOCOR) 40 MG tablet Take 40 mg by mouth at bedtime.      Historical Provider, MD    Family History Family History  Problem Relation Age of Onset  . Diabetes Mother   . Hypertension Mother   . Diabetes Sister   . Hypertension Sister   . Hypertension Brother     Social History Social History  Substance Use Topics  . Smoking status: Never Smoker  . Smokeless tobacco: Never Used  . Alcohol use  No     Allergies   Codeine; Macrodantin [nitrofurantoin]; and Septra [sulfamethoxazole w/trimethoprim (co-trimoxazole)]   Review of Systems Review of Systems  Musculoskeletal: Positive for neck pain.  All other systems reviewed and are negative.    Physical Exam Updated Vital Signs BP 127/84 (BP Location: Right Arm)   Pulse 72   Temp 98.4 F (36.9 C) (Oral)   Resp 17   LMP 11/27/2015   SpO2 99%   Physical Exam  Constitutional: She is oriented to person, place, and time. She appears well-developed and well-nourished. No distress.  HENT:  Head: Normocephalic and atraumatic.  Mouth/Throat: Oropharynx is clear and moist.  Eyes: Pupils are equal, round, and reactive to light.  Neck:  Tenderness to palpation to the right posterior lateral neck. No significant midline tenderness. There is palpable muscle spasm in the right lateral neck. She has difficulty with lateral rotation of the neck to the right. No carotid  bruit. No stridor.  Cardiovascular: Normal rate and regular rhythm.   No murmur heard. Pulmonary/Chest: Effort normal and breath sounds normal. No respiratory distress.  Musculoskeletal: She exhibits no edema.  2+ radial pulses bilaterally  Neurological: She is alert and oriented to person, place, and time.  5 out of 5 grip strength in bilateral upper extremities. Sensation to light touch intact throughout bilateral upper extremities.  Skin: Skin is warm and dry.  Psychiatric: She has a normal mood and affect. Her behavior is normal.  Nursing note and vitals reviewed.    ED Treatments / Results  Labs (all labs ordered are listed, but only abnormal results are displayed) Labs Reviewed - No data to display  EKG  EKG Interpretation None       Radiology Dg Cervical Spine Complete  Result Date: 12/15/2015 CLINICAL DATA:  Right side neck pain for 3 days with numbness and tingling in the right arm. No known injury. EXAM: CERVICAL SPINE - COMPLETE 4+ VIEW COMPARISON:  Plain film cervical spine 02/11/2012. FINDINGS: Again seen is postoperative change of C5-6 fusion. There has been progressive loss of disc space height at C2-3 and C3-4. Multilevel anterior endplate spurring is worst off the anterior aspect of C4 and has also progressed since the prior exam. Prevertebral soft tissues appear normal. Lung apices are clear. IMPRESSION: No acute abnormality. 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 Progressive spondylosis since the comparison exam. Electronically Signed   By: Inge Rise M.D.   On: 12/15/2015 15:23    Procedures Procedures (including critical care time)  Medications Ordered in ED Medications  ketorolac (TORADOL) injection 60 mg (60 mg Intramuscular Given 12/15/15 1446)     Initial Impression / Assessment and Plan / ED Course  I have reviewed the triage vital signs and the nursing notes.  Pertinent labs & imaging results that were available during my care of the patient were reviewed  by me and considered in my medical decision making (see chart for details).  Clinical Course    Pt with hx/o spinal surgery, RA here with nontraumatic right sided neck pain.  Hx, exam c/w torticollis.  There are no neurologic deficits on exam.  presentation not c/w epidural abscess.  Imaging demonstrates worsening degenerative changes.  Discussed with patient findings of studies, treatment plan, and need for outpatient follow up.  Final Clinical Impressions(s) / ED Diagnoses   Final diagnoses:  Torticollis    New Prescriptions New Prescriptions   CYCLOBENZAPRINE (FLEXERIL) 10 MG TABLET    Take 1 tablet (10 mg total) by mouth 2 (two)  times daily as needed for muscle spasms.   IBUPROFEN (ADVIL,MOTRIN) 800 MG TABLET    Take 1 tablet (800 mg total) by mouth 3 (three) times daily.     Quintella Reichert, MD 12/16/15 [phone]

## 2015-12-15 NOTE — ED Triage Notes (Signed)
Reports right sided neck pain with no injury.

## 2016-01-03 MED FILL — METOPROLOL SUCC ER 50 MG TA: 50 | 30 days supply | Qty: 30 | Fill #0

## 2016-02-01 MED FILL — SIMVASTATIN 40 MG TABLET: 40 | 90 days supply | Qty: 90 | Fill #0

## 2016-02-01 MED FILL — FLUoxetine HCL 20 MG CAPS: 20 | 90 days supply | Qty: 90 | Fill #0

## 2016-02-01 MED FILL — DOXEPIN 50 MG CAPSULE: 50 | 60 days supply | Qty: 30 | Fill #0

## 2016-02-01 MED FILL — METOPROLOL SUCC ER 100 MG T: 100 | 90 days supply | Qty: 90 | Fill #0

## 2016-02-01 MED FILL — raNITIdine HCL 150 MG TABS: 150 | 90 days supply | Qty: 90 | Fill #0

## 2016-02-01 MED FILL — ASPIR-LOW EC 81 MG TABLET: 81 | 90 days supply | Qty: 90 | Fill #0

## 2016-02-10 ENCOUNTER — Emergency Department (HOSPITAL_BASED_OUTPATIENT_CLINIC_OR_DEPARTMENT_OTHER): Payer: No Typology Code available for payment source

## 2016-02-10 ENCOUNTER — Encounter (HOSPITAL_BASED_OUTPATIENT_CLINIC_OR_DEPARTMENT_OTHER): Payer: Self-pay | Admitting: *Deleted

## 2016-02-10 ENCOUNTER — Emergency Department (HOSPITAL_BASED_OUTPATIENT_CLINIC_OR_DEPARTMENT_OTHER)
Admission: EM | Admit: 2016-02-10 | Discharge: 2016-02-10 | Disposition: A | Discharge status: Home / Self Care | Payer: No Typology Code available for payment source | Attending: Emergency Medicine | Admitting: Emergency Medicine

## 2016-02-10 DIAGNOSIS — I10 Essential (primary) hypertension: Secondary | ICD-10-CM | POA: Insufficient documentation

## 2016-02-10 DIAGNOSIS — Z7982 Long term (current) use of aspirin: Secondary | ICD-10-CM | POA: Insufficient documentation

## 2016-02-10 DIAGNOSIS — R1031 Right lower quadrant pain: Secondary | ICD-10-CM

## 2016-02-10 DIAGNOSIS — Z791 Long term (current) use of non-steroidal anti-inflammatories (NSAID): Secondary | ICD-10-CM | POA: Insufficient documentation

## 2016-02-10 DIAGNOSIS — B373 Candidiasis of vulva and vagina: Secondary | ICD-10-CM | POA: Diagnosis not present

## 2016-02-10 DIAGNOSIS — Z79899 Other long term (current) drug therapy: Secondary | ICD-10-CM | POA: Insufficient documentation

## 2016-02-10 DIAGNOSIS — J45909 Unspecified asthma, uncomplicated: Secondary | ICD-10-CM | POA: Insufficient documentation

## 2016-02-10 DIAGNOSIS — R102 Pelvic and perineal pain: Secondary | ICD-10-CM | POA: Diagnosis present

## 2016-02-10 DIAGNOSIS — B3731 Acute candidiasis of vulva and vagina: Secondary | ICD-10-CM

## 2016-02-10 LAB — PREGNANCY, URINE: PREG TEST UR: NEGATIVE

## 2016-02-10 LAB — URINALYSIS, ROUTINE W REFLEX MICROSCOPIC
BILIRUBIN URINE: NEGATIVE
Glucose, UA: NEGATIVE mg/dL
HGB URINE DIPSTICK: NEGATIVE
KETONES UR: NEGATIVE mg/dL
Leukocytes, UA: NEGATIVE
NITRITE: NEGATIVE
PROTEIN: NEGATIVE mg/dL
Specific Gravity, Urine: 1.014 (ref 1.005–1.030)
pH: 5 (ref 5.0–8.0)

## 2016-02-10 LAB — WET PREP, GENITAL
Clue Cells Wet Prep HPF POC: NONE SEEN
Sperm: NONE SEEN
Trich, Wet Prep: NONE SEEN

## 2016-02-10 MED ORDER — MICONAZOLE NITRATE 4 % VA CREA: 1.0000 | TOPICAL_CREAM | Freq: Every day | VAGINAL | 0 refills | Status: AC

## 2016-02-10 MED ORDER — DOXYCYCLINE HYCLATE 100 MG PO CAPS: 100.0000 mg | ORAL_CAPSULE | Freq: Two times a day (BID) | ORAL | 0 refills | Status: DC

## 2016-02-10 MED ORDER — LIDOCAINE HCL (PF) 1 % IJ SOLN
INTRAMUSCULAR | Status: AC
  Filled 2016-02-10: qty 5

## 2016-02-10 MED ORDER — FLUCONAZOLE 100 MG PO TABS
200.0000 mg | ORAL_TABLET | Freq: Once | ORAL | Status: AC
  Administered 2016-02-10: 200 mg via ORAL
  Filled 2016-02-10: qty 2

## 2016-02-10 MED ORDER — CEFTRIAXONE SODIUM 250 MG IJ SOLR
250.0000 mg | Freq: Once | INTRAMUSCULAR | Status: DC
  Filled 2016-02-10: qty 250

## 2016-02-10 MED ORDER — FLUCONAZOLE 150 MG PO TABS: 150.0000 mg | ORAL_TABLET | Freq: Every day | ORAL | 0 refills | Status: AC

## 2016-02-10 MED ORDER — DOXYCYCLINE HYCLATE 100 MG PO TABS
100.0000 mg | ORAL_TABLET | Freq: Once | ORAL | Status: DC
  Filled 2016-02-10: qty 1

## 2016-02-10 MED ORDER — KETOROLAC TROMETHAMINE 60 MG/2ML IM SOLN
60.0000 mg | Freq: Once | INTRAMUSCULAR | Status: AC
  Administered 2016-02-10: 60 mg via INTRAMUSCULAR
  Filled 2016-02-10: qty 2

## 2016-02-10 MED ORDER — AZITHROMYCIN 250 MG PO TABS
1000.0000 mg | ORAL_TABLET | Freq: Once | ORAL | Status: DC
  Filled 2016-02-10: qty 4

## 2016-02-10 NOTE — ED Provider Notes (Signed)
[facility] Provider Note   CSN: [id number] Arrival date & time: 02/10/16  0848     History   Chief Complaint Chief Complaint  Patient presents with  . Pelvic Pain    HPI Katherine Wiggins is a 50 y.o. female.  HPI   Katherine Wiggins is a 50 y.o. female, with a history of HTN and fibromyalgia, presenting to the ED with Central and right pelvic pain beginning 4 days ago. Also endorses urinary frequency, increased vaginal discharge, and a change in vaginal discharge consistency. Vaginal discharge is now a mix of white and clear. States her pelvic pain is "an comfortable soreness," severe, nonradiating. Endorses nausea. Patient denies fever/chills, vomiting/diarrhea, or any other complaints.  LMP beginning of November. Patient endorses she is in a monogamous relationship with one female partner, with whom she has been with for 4 years. No suspicion of infidelity. Patient has an upcoming OB/GYN appointment already scheduled.    Past Medical History:  Diagnosis Date  . Asthma   . Depression   . Fibromyalgia   . High cholesterol   . Hypertension   . RA (rheumatoid arthritis) (HCC)     There are no active problems to display for this patient.   Past Surgical History:  Procedure Laterality Date  . CERVICAL SPINE SURGERY    . RIGHT OOPHORECTOMY    . TUBAL LIGATION      OB History    Gravida Para Term Preterm AB Living   5 3 3     3    SAB TAB Ectopic Multiple Live Births                   Home Medications    Prior to Admission medications   Medication Sig Start Date End Date Taking? Authorizing Provider  aspirin 81 MG tablet Take 81 mg by mouth daily.   Yes Historical Provider, MD  citalopram (CELEXA) 40 MG tablet Take 40 mg by mouth daily.     Yes Historical Provider, MD  doxepin (SINEQUAN) 100 MG capsule Take 100 mg by mouth daily.     Yes Historical Provider, MD  fexofenadine (ALLEGRA) 60 MG tablet Take 1 tablet (60 mg total) by mouth 2 (two) times  daily. 05/30/13  Yes Malvin Johns, MD  ibuprofen (ADVIL,MOTRIN) 800 MG tablet Take 1 tablet (800 mg total) by mouth 3 (three) times daily. 12/15/15  Yes Quintella Reichert, MD  metoprolol (TOPROL-XL) 50 MG 24 hr tablet Take 50 mg by mouth daily.     Yes Historical Provider, MD  simvastatin (ZOCOR) 40 MG tablet Take 40 mg by mouth at bedtime.     Yes Historical Provider, MD  cyclobenzaprine (FLEXERIL) 10 MG tablet Take 1 tablet (10 mg total) by mouth 2 (two) times daily as needed for muscle spasms. 12/15/15   Quintella Reichert, MD  diazepam (VALIUM) 2 MG tablet Take 1 tablet (2 mg total) by mouth every 6 (six) hours as needed for anxiety (spasms). 08/30/15   Deno Etienne, DO  erythromycin ophthalmic ointment Place a 1/2 inch ribbon of ointment into the lower eyelid 4 times per day for 5 days 12/06/15   Sherwood Gambler, MD  fluconazole (DIFLUCAN) 150 MG tablet Take 1 tablet (150 mg total) by mouth daily. Take the first tablet in two days, then the second tablet two days after that. 02/10/16 02/12/16  Shawn C Joy, PA-C  MICONAZOLE NITRATE VAGINAL (MICONAZOLE 3) 4 % CREA Place 1 applicator vaginally at bedtime. 02/10/16 02/13/16  Shawn C  Joy, PA-C    Family History Family History  Problem Relation Age of Onset  . Diabetes Mother   . Hypertension Mother   . Diabetes Sister   . Hypertension Sister   . Hypertension Brother     Social History Social History  Substance Use Topics  . Smoking status: Never Smoker  . Smokeless tobacco: Never Used  . Alcohol use No     Allergies   Codeine; Macrodantin [nitrofurantoin]; and Septra [sulfamethoxazole w/trimethoprim (co-trimoxazole)]   Review of Systems Review of Systems  Constitutional: Negative for chills and fever.  Gastrointestinal: Positive for nausea. Negative for constipation, diarrhea and vomiting.  Genitourinary: Positive for pelvic pain and vaginal discharge. Negative for flank pain.  Musculoskeletal: Negative for back pain.  All other systems  reviewed and are negative.    Physical Exam Updated Vital Signs BP 148/92 (BP Location: Right Arm)   Pulse 94   Temp 98.1 F (36.7 C) (Oral)   Resp 16   Ht 5\' 3"  (1.6 m)   Wt 94.3 kg   LMP 01/24/2016   SpO2 99%   BMI 36.85 kg/m   Physical Exam  Constitutional: She appears well-developed and well-nourished. No distress.  Patient is noted to be smiling, calmly sitting on the bed without obvious distress or discomfort.  HENT:  Head: Normocephalic and atraumatic.  Eyes: Conjunctivae are normal.  Neck: Neck supple.  Cardiovascular: Normal rate, regular rhythm, normal heart sounds and intact distal pulses.   Pulmonary/Chest: Effort normal and breath sounds normal. No respiratory distress.  Abdominal: Soft. Normal appearance and bowel sounds are normal. There is tenderness in the right lower quadrant. There is no rigidity, no guarding and no tenderness at McBurney's point.  Genitourinary:  Genitourinary Comments: External genitalia normal Vagina with discharge - Thick, white clumps. White material coating the vaginal walls Cervix  Red and inflamed; Tender, but not classic CMT Adnexa palpated, no masses, positive for tenderness noted on the right Bladder palpated negative for tenderness Uterus palpated no masses, negative for tenderness  Otherwise normal female genitalia. RN, Alyse Low, served as Producer, television/film/video during exam.  Musculoskeletal: She exhibits no edema.  Lymphadenopathy:    She has no cervical adenopathy.  Neurological: She is alert.  Skin: Skin is warm and dry. She is not diaphoretic.  Psychiatric: She has a normal mood and affect. Her behavior is normal.  Nursing note and vitals reviewed.    ED Treatments / Results  Labs (all labs ordered are listed, but only abnormal results are displayed) Labs Reviewed  WET PREP, GENITAL - Abnormal; Notable for the following:       Result Value   Yeast Wet Prep HPF POC PRESENT (*)    WBC, Wet Prep HPF POC MANY (*)    All other  components within normal limits  URINALYSIS, ROUTINE W REFLEX MICROSCOPIC (NOT AT [facility]) - Abnormal; Notable for the following:    APPearance CLOUDY (*)    All other components within normal limits  PREGNANCY, URINE  RPR  HIV ANTIBODY (ROUTINE TESTING)  GC/CHLAMYDIA PROBE AMP ([facility]) NOT AT [facility]    EKG  EKG Interpretation None       Radiology US Transvaginal Non-ob  Result Date: 02/10/2016 CLINICAL DATA:  Diffuse mid pelvic pain for 4 days. Prior right oophorectomy EXAM: TRANSABDOMINAL AND TRANSVAGINAL ULTRASOUND OF PELVIS DOPPLER ULTRASOUND OF OVARIES TECHNIQUE: Both transabdominal and transvaginal ultrasound examinations of the pelvis were performed. Transabdominal technique was performed for global imaging of the pelvis including uterus, ovaries, adnexal  regions, and pelvic cul-de-sac. It was necessary to proceed with endovaginal exam following the transabdominal exam to visualize the uterus, endometrium and left ovary. Color and duplex Doppler ultrasound was utilized to evaluate blood flow to the ovaries. COMPARISON:  11/02/2012 FINDINGS: Uterus Measurements: 10.7 x 6.2 x 7.9 cm. Small posterior fundal fibroid measures 13 mm Endometrium Thickness: 15 mm in thickness.  No focal abnormality visualized. Right ovary Measurements: Prior right oophorectomy. No adnexal mass seen. Left ovary Measurements: 3.7 x 2.9 x 4.1 cm. Normal appearance/no adnexal mass. Pulsed Doppler evaluation of the left ovary demonstrates normal low-resistance arterial and venous waveforms. Other findings No abnormal free fluid. IMPRESSION: Small uterine fibroid. No acute or significant abnormality. Electronically Signed   By: Rolm Baptise M.D.   On: 02/10/2016 10:40   US Pelvis Complete  Result Date: 02/10/2016 CLINICAL DATA:  Diffuse mid pelvic pain for 4 days. Prior right oophorectomy EXAM: TRANSABDOMINAL AND TRANSVAGINAL ULTRASOUND OF PELVIS DOPPLER ULTRASOUND OF OVARIES TECHNIQUE: Both transabdominal and  transvaginal ultrasound examinations of the pelvis were performed. Transabdominal technique was performed for global imaging of the pelvis including uterus, ovaries, adnexal regions, and pelvic cul-de-sac. It was necessary to proceed with endovaginal exam following the transabdominal exam to visualize the uterus, endometrium and left ovary. Color and duplex Doppler ultrasound was utilized to evaluate blood flow to the ovaries. COMPARISON:  11/02/2012 FINDINGS: Uterus Measurements: 10.7 x 6.2 x 7.9 cm. Small posterior fundal fibroid measures 13 mm Endometrium Thickness: 15 mm in thickness.  No focal abnormality visualized. Right ovary Measurements: Prior right oophorectomy. No adnexal mass seen. Left ovary Measurements: 3.7 x 2.9 x 4.1 cm. Normal appearance/no adnexal mass. Pulsed Doppler evaluation of the left ovary demonstrates normal low-resistance arterial and venous waveforms. Other findings No abnormal free fluid. IMPRESSION: Small uterine fibroid. No acute or significant abnormality. Electronically Signed   By: Rolm Baptise M.D.   On: 02/10/2016 10:40   [place] Art/ven Flow Abd Pelv Doppler  Result Date: 02/10/2016 CLINICAL DATA:  Diffuse mid pelvic pain for 4 days. Prior right oophorectomy EXAM: TRANSABDOMINAL AND TRANSVAGINAL ULTRASOUND OF PELVIS DOPPLER ULTRASOUND OF OVARIES TECHNIQUE: Both transabdominal and transvaginal ultrasound examinations of the pelvis were performed. Transabdominal technique was performed for global imaging of the pelvis including uterus, ovaries, adnexal regions, and pelvic cul-de-sac. It was necessary to proceed with endovaginal exam following the transabdominal exam to visualize the uterus, endometrium and left ovary. Color and duplex Doppler ultrasound was utilized to evaluate blood flow to the ovaries. COMPARISON:  11/02/2012 FINDINGS: Uterus Measurements: 10.7 x 6.2 x 7.9 cm. Small posterior fundal fibroid measures 13 mm Endometrium Thickness: 15 mm in thickness.  No focal  abnormality visualized. Right ovary Measurements: Prior right oophorectomy. No adnexal mass seen. Left ovary Measurements: 3.7 x 2.9 x 4.1 cm. Normal appearance/no adnexal mass. Pulsed Doppler evaluation of the left ovary demonstrates normal low-resistance arterial and venous waveforms. Other findings No abnormal free fluid. IMPRESSION: Small uterine fibroid. No acute or significant abnormality. Electronically Signed   By: Rolm Baptise M.D.   On: 02/10/2016 10:40    Procedures Procedures (including critical care time)  Medications Ordered in ED Medications  lidocaine (PF) (XYLOCAINE) 1 % injection (  Not Given 02/10/16 1046)  ketorolac (TORADOL) injection 60 mg (60 mg Intramuscular Given 02/10/16 0952)  fluconazole (DIFLUCAN) tablet 200 mg (200 mg Oral Given 02/10/16 1044)     Initial Impression / Assessment and Plan / ED Course  I have reviewed the triage vital signs and the  nursing notes.  Pertinent labs & imaging results that were available during my care of the patient were reviewed by me and considered in my medical decision making (see chart for details).  Clinical Course     Patient presents with increased vaginal discharge and pelvic discomfort for the past 4-5 days. Evidence of extensive vulvovaginal candidiasis on exam, confirmed with wet prep. Patient is at low risk for STDs and PID. The yeast infection is the most likely cause of her discomfort. Patient encouraged to keep her OB/GYN appointment and therefore will have appropriate follow-up. Return precautions discussed.  Findings and plan of care discussed with Blanchie Dessert, MD.   Vitals:   02/10/16 [id number] 02/10/16 0852 02/10/16 1046  BP: 148/92  144/87  Pulse: 94  86  Resp: 16  16  Temp: 98.1 F (36.7 C)    TempSrc: Oral    SpO2: 99%  98%  Weight:  94.3 kg   Height:  5\' 3"  (1.6 m)      Final Clinical Impressions(s) / ED Diagnoses   Final diagnoses:  Right lower quadrant abdominal pain  Vulvovaginal  candidiasis    New Prescriptions Discharge Medication List as of 02/10/2016 11:00 AM    START taking these medications   Details  fluconazole (DIFLUCAN) 150 MG tablet Take 1 tablet (150 mg total) by mouth daily. Take the first tablet in two days, then the second tablet two days after that., Starting Sat 02/10/2016, Until Mon 02/12/2016, Print    MICONAZOLE NITRATE VAGINAL (MICONAZOLE 3) 4 % CREA Place 1 applicator vaginally at bedtime., Starting Sat 02/10/2016, Until Tue 02/13/2016, Print         Lorayne Bender, PA-C 02/10/16 1143    Blanchie Dessert, MD 02/10/16 1610

## 2016-02-10 NOTE — ED Notes (Signed)
ED Provider at bedside. 

## 2016-02-10 NOTE — ED Notes (Signed)
Patient transported to Ultrasound 

## 2016-02-10 NOTE — Discharge Instructions (Signed)
You have evidence of a significant yeast infection on both exam and the wet prep. This is likely the source of your discomfort.  1. Fluconazole: You received the first dose of this medication in the ED. Take the second dose 2 days from now on November 20. Take the third dose on November 22. 2. Miconazole: Insert one applicator-full of this medication into the vagina right before bed for the next three nights. 3. Probiotic: Choose a probiotic that contains lactobacillus species. This supplement promotes growth of the "good" bacteria in the vagina and can improve overall vaginal health.   You will still need to keep your upcoming appointment with OBGYN.

## 2016-02-10 NOTE — ED Triage Notes (Signed)
Pt reports pelvic pain (R>L) since Tuesday; also reports urinary frequency and urgency and clear vaginal discharge (reports this is not normal for her). Denies hematuria, dysuria (states that her vagina feels sore;denies itching, burning sensation). Reports nausea, denies v/d. Denies known exposure to STDs (reports having unprotected sex). Denies fever.

## 2016-02-11 LAB — HIV ANTIBODY (ROUTINE TESTING W REFLEX): HIV SCREEN 4TH GENERATION: NONREACTIVE

## 2016-02-11 LAB — RPR: RPR: NONREACTIVE

## 2016-02-12 LAB — GC/CHLAMYDIA PROBE AMP (~~LOC~~) NOT AT ARMC
CHLAMYDIA, DNA PROBE: NEGATIVE
NEISSERIA GONORRHEA: NEGATIVE

## 2016-05-02 MED FILL — raNITIdine HCL 150 MG TABS: 150 | 30 days supply | Qty: 30 | Fill #0

## 2016-05-02 MED FILL — SIMVASTATIN 40 MG TABLET: 40 | 30 days supply | Qty: 30 | Fill #0

## 2016-05-02 MED FILL — FLUTICASONE PROP 50 MCG SPR: 50 | 30 days supply | Qty: 16 | Fill #0

## 2016-05-02 MED FILL — FLUoxetine HCL 20 MG CAPS: 20 | 30 days supply | Qty: 30 | Fill #0

## 2016-05-02 MED FILL — AMITRIPTYLINE HCL 25 MG TAB: 25 | 30 days supply | Qty: 30 | Fill #0

## 2016-05-02 MED FILL — predniSONE 20 MG TABS: 20 | 5 days supply | Qty: 10 | Fill #0

## 2016-05-02 MED FILL — METOPROLOL SUCC ER 100 MG T: 100 | 30 days supply | Qty: 30 | Fill #0

## 2016-06-03 MED FILL — METOPROLOL SUCC ER 100 MG T: 100 | 30 days supply | Qty: 30 | Fill #1

## 2016-06-03 MED FILL — AMITRIPTYLINE HCL 25 MG TAB: 25 | 30 days supply | Qty: 30 | Fill #1

## 2016-06-03 MED FILL — raNITIdine HCL 150 MG TABS: 150 | 30 days supply | Qty: 30 | Fill #1

## 2016-06-03 MED FILL — FLUoxetine HCL 20 MG CAPS: 20 | 30 days supply | Qty: 30 | Fill #1

## 2016-06-03 MED FILL — SIMVASTATIN 40 MG TABLET: 40 | 30 days supply | Qty: 30 | Fill #1

## 2016-06-11 ENCOUNTER — Emergency Department (HOSPITAL_BASED_OUTPATIENT_CLINIC_OR_DEPARTMENT_OTHER)
Admission: EM | Admit: 2016-06-11 | Discharge: 2016-06-11 | Disposition: A | Discharge status: Home / Self Care | Payer: Self-pay | Attending: Emergency Medicine | Admitting: Emergency Medicine

## 2016-06-11 ENCOUNTER — Emergency Department (HOSPITAL_BASED_OUTPATIENT_CLINIC_OR_DEPARTMENT_OTHER): Payer: Self-pay

## 2016-06-11 ENCOUNTER — Encounter (HOSPITAL_BASED_OUTPATIENT_CLINIC_OR_DEPARTMENT_OTHER): Payer: Self-pay | Admitting: Emergency Medicine

## 2016-06-11 DIAGNOSIS — Z79899 Other long term (current) drug therapy: Secondary | ICD-10-CM | POA: Insufficient documentation

## 2016-06-11 DIAGNOSIS — M25572 Pain in left ankle and joints of left foot: Secondary | ICD-10-CM | POA: Insufficient documentation

## 2016-06-11 DIAGNOSIS — J45909 Unspecified asthma, uncomplicated: Secondary | ICD-10-CM | POA: Insufficient documentation

## 2016-06-11 DIAGNOSIS — I10 Essential (primary) hypertension: Secondary | ICD-10-CM | POA: Insufficient documentation

## 2016-06-11 DIAGNOSIS — Z7982 Long term (current) use of aspirin: Secondary | ICD-10-CM | POA: Insufficient documentation

## 2016-06-11 MED ORDER — IBUPROFEN 800 MG PO TABS
800.0000 mg | ORAL_TABLET | Freq: Once | ORAL | Status: AC
  Administered 2016-06-11: 800 mg via ORAL
  Filled 2016-06-11: qty 1

## 2016-06-11 MED ORDER — PREDNISONE 10 MG (21) PO TBPK: ORAL_TABLET | Freq: Every day | ORAL | 0 refills | Status: DC

## 2016-06-11 MED ORDER — ACETAMINOPHEN 500 MG PO TABS
1000.0000 mg | ORAL_TABLET | Freq: Once | ORAL | Status: AC
  Administered 2016-06-11: 1000 mg via ORAL
  Filled 2016-06-11: qty 2

## 2016-06-11 MED FILL — predniSONE 10 MG TABS: 10 | 6 days supply | Qty: 42 | Fill #0

## 2016-06-11 NOTE — ED Triage Notes (Signed)
Pt having left foot pain since Sunday.  No known injury.  Pt states nothing seems to help pain.  Worse with ambulation.  Pt relates her right foot doesn't hurt.

## 2016-06-11 NOTE — ED Provider Notes (Signed)
[facility] Provider Note   CSN: [id number] Arrival date & time: 06/11/16  9983     History   Chief Complaint Chief Complaint  Patient presents with  . Foot Pain    HPI Katherine Wiggins is a 51 y.o. female with PMHx fibromyalgia and RA who presents today with 2 days of worsening left foot and ankle pain. She states developing pain Sunday night at rest, which worsened yesterday while at work. Pain is currently 9/10 achey pain localized to circumferential left ankle and dorsum of foot that does not radiate and worsens with weight-bearing and ambulation. She has tried ibuprofen and warm water soaks with no improvement. Denies weakness, numbness, or tingling. Denies low back pain or recent trauma/falls. She was recently given a 5 day course of prednisone for an RA flare in her hands and states symptoms improved upon completion of steroid. Denies alcohol use or IVDU.   Denies HA, dizziness, LOC, CP/SOB, cough, sore throat, abd pain, n/v/d, dysuria, hematuria, melena.   The history is provided by the patient.    Past Medical History:  Diagnosis Date  . Asthma   . Depression   . Fibromyalgia   . High cholesterol   . Hypertension   . RA (rheumatoid arthritis) (HCC)     There are no active problems to display for this patient.   Past Surgical History:  Procedure Laterality Date  . CERVICAL SPINE SURGERY    . RIGHT OOPHORECTOMY    . TUBAL LIGATION      OB History    Gravida Para Term Preterm AB Living   5 3 3     3    SAB TAB Ectopic Multiple Live Births                   Home Medications    Prior to Admission medications   Medication Sig Start Date End Date Taking? Authorizing Provider  amitriptyline (ELAVIL) 25 MG tablet Take 25 mg by mouth at bedtime.   Yes Historical Provider, MD  aspirin 81 MG tablet Take 81 mg by mouth daily.   Yes Historical Provider, MD  fexofenadine (ALLEGRA) 60 MG tablet Take 1 tablet (60 mg total) by mouth 2 (two) times daily.  05/30/13  Yes Malvin Johns, MD  FLUoxetine (PROZAC) 20 MG tablet Take 20 mg by mouth daily.   Yes Historical Provider, MD  ibuprofen (ADVIL,MOTRIN) 800 MG tablet Take 1 tablet (800 mg total) by mouth 3 (three) times daily. 12/15/15  Yes Quintella Reichert, MD  metoprolol (TOPROL-XL) 50 MG 24 hr tablet Take 50 mg by mouth daily.     Yes Historical Provider, MD  simvastatin (ZOCOR) 40 MG tablet Take 40 mg by mouth at bedtime.     Yes Historical Provider, MD  predniSONE (STERAPRED UNI-PAK 21 TAB) 10 MG (21) TBPK tablet Take by mouth daily. Take 6 tabs by mouth daily  for 2 days, then 5 tabs for 2 days, then 4 tabs for 2 days, then 3 tabs for 2 days, 2 tabs for 2 days, then 1 tab by mouth daily for 2 days 06/11/16   Renita Papa, PA    Family History Family History  Problem Relation Age of Onset  . Diabetes Mother   . Hypertension Mother   . Diabetes Sister   . Hypertension Sister   . Hypertension Brother     Social History Social History  Substance Use Topics  . Smoking status: Never Smoker  . Smokeless tobacco: Never Used  .  Alcohol use No     Allergies   Codeine; Macrodantin [nitrofurantoin]; and Septra [sulfamethoxazole w/trimethoprim (co-trimoxazole)]   Review of Systems Review of Systems  Constitutional: Negative for chills and fever.  HENT: Negative for congestion and sore throat.   Eyes: Negative for discharge.  Respiratory: Negative for cough and shortness of breath.   Cardiovascular: Negative for chest pain and leg swelling.  Gastrointestinal: Negative for abdominal distention, abdominal pain, blood in stool, diarrhea, nausea and vomiting.  Genitourinary: Negative for dysuria and hematuria.  Musculoskeletal: Positive for arthralgias and joint swelling. Negative for back pain and myalgias.  Skin: Negative for color change, pallor and wound.  Neurological: Negative for dizziness, syncope, weakness, numbness and headaches.     Physical Exam Updated Vital Signs BP 139/86  (BP Location: Right Arm)   Pulse 83   Temp 97.8 F (36.6 C) (Oral)   Resp 18   Ht 5\' 4"  (1.626 m)   Wt 92.1 kg   LMP 06/03/2016   SpO2 98%   BMI 34.84 kg/m   Physical Exam  Constitutional: She is oriented to person, place, and time. She appears well-developed and well-nourished. No distress.  HENT:  Head: Normocephalic and atraumatic.  Eyes: Conjunctivae are normal. Right eye exhibits no discharge. Left eye exhibits no discharge. No scleral icterus.  Neck: Neck supple. No JVD present. No tracheal deviation present. No thyromegaly present.  Cardiovascular: Normal rate, regular rhythm, normal heart sounds and intact distal pulses.   2+ dp/pt pulses b/l  Pulmonary/Chest: Effort normal and breath sounds normal.  Abdominal: Soft.  Musculoskeletal: She exhibits edema and tenderness.  Full ROM of right ankle. Left ankle ROM limited due to pain with dorsiflexion, platarflexsion, inversion, and eversion. Ankle joint mildly swollen with extension to upper dorsum of foot, no erythema or warmth of joint. No ligamentous laxity appreciated. 5/5 strength in BLE  Neurological: She is alert and oriented to person, place, and time.  L ankle sensation intact. No numbness, tingling.   Skin: Skin is warm and dry. Capillary refill takes less than 2 seconds. She is not diaphoretic. No erythema.  Psychiatric: She has a normal mood and affect. Her behavior is normal. Judgment and thought content normal.     ED Treatments / Results  Labs (all labs ordered are listed, but only abnormal results are displayed) Labs Reviewed - No data to display  EKG  EKG Interpretation None       Radiology Dg Ankle Complete Left  Result Date: 06/11/2016 CLINICAL DATA:  Pain and swelling for 2 days EXAM: LEFT ANKLE COMPLETE - 3+ VIEW COMPARISON:  None. FINDINGS: Frontal, oblique, and lateral views were obtained. There is slight soft tissue swelling. There is no appreciable fracture. There is a small joint effusion.  There is no appreciable joint space narrowing or erosion. There is an inferior calcaneal spur. IMPRESSION: Mild soft tissue swelling with small joint effusion. Question underlying ligamentous injury. Arthropathy could also present in this manner. There is no joint space narrowing or erosion. No evident fracture. Ankle mortise appears intact. There is a small inferior calcaneal spur. Electronically Signed   By: Lowella Grip III M.D.   On: 06/11/2016 08:27   Dg Foot Complete Left  Result Date: 06/11/2016 CLINICAL DATA:  Pain and swelling for 2 days EXAM: LEFT FOOT - COMPLETE 3+ VIEW COMPARISON:  None. FINDINGS: Frontal, oblique, and lateral views were obtained. There is soft tissue swelling dorsally. No fracture or dislocation. Joint spaces appear normal. No erosive change. There is  a small inferior calcaneal spur. IMPRESSION: Soft tissue swelling dorsally. No fracture or dislocation. No apparent arthropathy. Small inferior calcaneal spur noted. Electronically Signed   By: Lowella Grip III M.D.   On: 06/11/2016 08:25    Procedures Procedures (including critical care time)  Medications Ordered in ED Medications  acetaminophen (TYLENOL) tablet 1,000 mg (1,000 mg Oral Given 06/11/16 0936)  ibuprofen (ADVIL,MOTRIN) tablet 800 mg (800 mg Oral Given 06/11/16 0936)     Initial Impression / Assessment and Plan / ED Course  I have reviewed the triage vital signs and the nursing notes.  Pertinent labs & imaging results that were available during my care of the patient were reviewed by me and considered in my medical decision making (see chart for details).     50yof w hx fibromyalgia and RA presents to ED today with 2 days of worsening left foot and ankle pain. Pt afebrile, VSS. Xrays of left foot and ankle show no fracture or disclocation, but mild joint effusion. In absence of warm, erythematous joint and baseline VS low suspicion of septic joint. Possibly initial presentation of gout, but no  evidence on XRAY and with pt's hx of RA, pain is more likely due to RA or minor trauma. Tylenol and ibuprofen given in ED helpful. Pt has responded well to steroid therapy in the past; provided with 12 day prednisone taper, as well as ace wrap for compression and support, and crutches. Discussed return precautions extensively, including development of fever/chills and a red, hot, swollen joint.   Pt seen and evaluated by Dr. Rex Kras.   Final Clinical Impressions(s) / ED Diagnoses   Final diagnoses:  Acute left ankle pain    New Prescriptions New Prescriptions   PREDNISONE (STERAPRED UNI-PAK 21 TAB) 10 MG (21) TBPK TABLET    Take by mouth daily. Take 6 tabs by mouth daily  for 2 days, then 5 tabs for 2 days, then 4 tabs for 2 days, then 3 tabs for 2 days, 2 tabs for 2 days, then 1 tab by mouth daily for 2 days     Renita Papa, [place] 06/11/16 [place], MD 06/19/16 [phone]

## 2016-07-01 MED FILL — SIMVASTATIN 40 MG TABLET: 40 | 30 days supply | Qty: 30 | Fill #2

## 2016-07-01 MED FILL — raNITIdine HCL 150 MG TABS: 150 | 30 days supply | Qty: 30 | Fill #2

## 2016-07-01 MED FILL — FLUoxetine HCL 20 MG CAPS: 20 | 30 days supply | Qty: 30 | Fill #2

## 2016-07-01 MED FILL — AMITRIPTYLINE HCL 25 MG TAB: 25 | 30 days supply | Qty: 30 | Fill #2

## 2016-07-01 MED FILL — METOPROLOL SUCC ER 100 MG T: 100 | 30 days supply | Qty: 30 | Fill #2

## 2016-07-28 ENCOUNTER — Emergency Department (HOSPITAL_BASED_OUTPATIENT_CLINIC_OR_DEPARTMENT_OTHER)
Admission: EM | Admit: 2016-07-28 | Discharge: 2016-07-28 | Disposition: A | Discharge status: Home / Self Care | Payer: No Typology Code available for payment source | Attending: Dermatology | Admitting: Dermatology

## 2016-07-28 DIAGNOSIS — H9201 Otalgia, right ear: Secondary | ICD-10-CM | POA: Insufficient documentation

## 2016-07-28 DIAGNOSIS — Z5321 Procedure and treatment not carried out due to patient leaving prior to being seen by health care provider: Secondary | ICD-10-CM | POA: Insufficient documentation

## 2016-07-28 NOTE — ED Notes (Signed)
Called for pt no response 

## 2016-07-28 NOTE — ED Notes (Signed)
Called once out in [place]. No reply.

## 2016-07-30 ENCOUNTER — Emergency Department (HOSPITAL_BASED_OUTPATIENT_CLINIC_OR_DEPARTMENT_OTHER)
Admission: EM | Admit: 2016-07-30 | Discharge: 2016-07-30 | Disposition: A | Discharge status: Home / Self Care | Payer: Self-pay | Attending: Emergency Medicine | Admitting: Emergency Medicine

## 2016-07-30 ENCOUNTER — Encounter (HOSPITAL_BASED_OUTPATIENT_CLINIC_OR_DEPARTMENT_OTHER): Payer: Self-pay | Admitting: *Deleted

## 2016-07-30 DIAGNOSIS — I10 Essential (primary) hypertension: Secondary | ICD-10-CM | POA: Insufficient documentation

## 2016-07-30 DIAGNOSIS — J45909 Unspecified asthma, uncomplicated: Secondary | ICD-10-CM | POA: Insufficient documentation

## 2016-07-30 DIAGNOSIS — Z7982 Long term (current) use of aspirin: Secondary | ICD-10-CM | POA: Insufficient documentation

## 2016-07-30 DIAGNOSIS — H9201 Otalgia, right ear: Secondary | ICD-10-CM | POA: Insufficient documentation

## 2016-07-30 DIAGNOSIS — Z79899 Other long term (current) drug therapy: Secondary | ICD-10-CM | POA: Insufficient documentation

## 2016-07-30 MED ORDER — FLUTICASONE PROPIONATE 50 MCG/ACT NA SUSP: 2.0000 | Freq: Every day | NASAL | 0 refills | Status: DC

## 2016-07-30 MED FILL — FLUoxetine HCL 20 MG CAPS: 20 | 30 days supply | Qty: 30 | Fill #3

## 2016-07-30 MED FILL — AMITRIPTYLINE HCL 25 MG TAB: 25 | 30 days supply | Qty: 30 | Fill #3

## 2016-07-30 MED FILL — METOPROLOL SUCC ER 100 MG T: 100 | 30 days supply | Qty: 30 | Fill #3

## 2016-07-30 MED FILL — raNITIdine HCL 150 MG TABS: 150 | 30 days supply | Qty: 30 | Fill #3

## 2016-07-30 MED FILL — SIMVASTATIN 40 MG TABLET: 40 | 30 days supply | Qty: 30 | Fill #3

## 2016-07-30 NOTE — ED Provider Notes (Signed)
Emergency Department Provider Note   I have reviewed the triage vital signs and the nursing notes.   HISTORY  Chief Complaint Otalgia   HPI Katherine Wiggins is a 51 y.o. female with PMH of asthma presents to the emergency department for evaluation of right ear pain. Symptoms been present for the last 2 weeks. Not improved with Tylenol and Motrin. No drainage from the ear. No fevers or chills. No pain behind the ear. No sore throat, cough, difficulty breathing. Patient does have seasonal allergies and has not been treating these this year.   Past Medical History:  Diagnosis Date  . Asthma   . Depression   . Fibromyalgia   . High cholesterol   . Hypertension   . RA (rheumatoid arthritis) (HCC)     There are no active problems to display for this patient.   Past Surgical History:  Procedure Laterality Date  . CERVICAL SPINE SURGERY    . RIGHT OOPHORECTOMY    . TUBAL LIGATION      Current Outpatient Rx  . Order #: [id number] Class: Historical Med  . Order #: [id number] Class: Historical Med  . Order #: [id number] Class: Historical Med  . Order #: [id number] Class: Print  . Order #: [id number] Class: Historical Med  . Order #: [id number] Class: Historical Med  . Order #: [id number] Class: Print  . Order #: [id number] Class: Print  . Order #: [id number] Class: Print    Allergies Codeine; Macrodantin [nitrofurantoin]; and Septra [sulfamethoxazole w/trimethoprim (co-trimoxazole)]  Family History  Problem Relation Age of Onset  . Diabetes Mother   . Hypertension Mother   . Diabetes Sister   . Hypertension Sister   . Hypertension Brother     Social History Social History  Substance Use Topics  . Smoking status: Never Smoker  . Smokeless tobacco: Never Used  . Alcohol use No    Review of Systems  Constitutional: No fever/chills Eyes: No visual changes. ENT: No sore throat. Positive right ear pain.  Cardiovascular: Denies chest pain. Respiratory: Denies shortness of  breath. Gastrointestinal: No abdominal pain.  No nausea, no vomiting.  No diarrhea.  No constipation. Genitourinary: Negative for dysuria. Musculoskeletal: Negative for back pain. Skin: Negative for rash. Neurological: Negative for headaches, focal weakness or numbness.  10-point ROS otherwise negative.  ____________________________________________   PHYSICAL EXAM:  VITAL SIGNS: ED Triage Vitals  Enc Vitals Group     BP 07/30/16 1531 (!) 143/92     Pulse Rate 07/30/16 1531 100     Resp 07/30/16 1531 20     Temp 07/30/16 1531 98.3 F (36.8 C)     Temp Source 07/30/16 1531 Oral     SpO2 07/30/16 1531 100 %     Weight 07/30/16 1529 203 lb (92.1 kg)     Height 07/30/16 1529 5\' 3"  (1.6 m)     Pain Score 07/30/16 1529 10    Constitutional: Alert and oriented. Well appearing and in no acute distress. Eyes: Conjunctivae are normal.  Head: Atraumatic. Ears:  Healthy appearing ear canals and TMs bilaterally. No mastoid tenderness.  Nose: No congestion/rhinnorhea. Mouth/Throat: Mucous membranes are moist.  Oropharynx non-erythematous. Neck: No stridor.   Cardiovascular: Normal rate, regular rhythm. Good peripheral circulation. Grossly normal heart sounds.   Respiratory: Normal respiratory effort.  No retractions. Lungs CTAB. Musculoskeletal: No lower extremity tenderness nor edema. No gross deformities of extremities. Neurologic:  Normal speech and language. No gross focal neurologic deficits are appreciated.  Skin:  Skin is warm, dry and intact. No rash noted. Psychiatric:  Mood and affect are normal. Speech and behavior are normal.  ____________________________________________   PROCEDURES  Procedure(s) performed:   Procedures  None ____________________________________________   INITIAL IMPRESSION / ASSESSMENT AND PLAN / ED COURSE  Pertinent labs & imaging results that were available during my care of the patient were reviewed by me and considered in my medical  decision making (see chart for details).  Patient with right ear pain in the setting of seasonal allergy symptoms. No fever, chills, or sign of infection on exam. No indication for abx at this time and patient agrees. Will begin allergy treatment. Discussed Claratin and will prescribe Flonase.   At this time, I do not feel there is any life-threatening condition present. I have reviewed and discussed all results (EKG, imaging, lab, urine as appropriate), exam findings with patient. I have reviewed nursing notes and appropriate previous records.  I feel the patient is safe to be discharged home without further emergent workup. Discussed usual and customary return precautions. Patient and family (if present) verbalize understanding and are comfortable with this plan.  Patient will follow-up with their primary care provider. If they do not have a primary care provider, information for follow-up has been provided to them. All questions have been answered.  ____________________________________________  FINAL CLINICAL IMPRESSION(S) / ED DIAGNOSES  Final diagnoses:  Right ear pain     MEDICATIONS GIVEN DURING THIS VISIT:  Medications - No data to display   NEW OUTPATIENT MEDICATIONS STARTED DURING THIS VISIT:  Discharge Medication List as of 07/30/2016  4:08 PM    START taking these medications   Details  fluticasone (FLONASE) 50 MCG/ACT nasal spray Place 2 sprays into both nostrils daily., Starting Tue 07/30/2016, Until Tue 08/13/2016, Print         Note:  This document was prepared using Dragon voice recognition software and may include unintentional dictation errors.  Nanda Quinton, MD Emergency Medicine   Ger Nicks, Wonda Olds, MD 07/31/16 [phone]

## 2016-07-30 NOTE — Discharge Instructions (Signed)
You were seen in the ED today with an earache. There is not sign of infection on exam. Follow up with your PCP in the coming week if symptoms do not improve. You may start taking Claritin which is available over the counter. I have prescribed Flonase to assist with symptoms.

## 2016-07-30 NOTE — ED Triage Notes (Signed)
Pain in her right ear x 2 weeks. No relief with OTC medications.

## 2016-08-27 MED FILL — METOPROLOL SUCC ER 100 MG T: 100 | 30 days supply | Qty: 30 | Fill #4

## 2016-08-27 MED FILL — AMITRIPTYLINE HCL 25 MG TAB: 25 | 30 days supply | Qty: 30 | Fill #4

## 2016-08-27 MED FILL — raNITIdine HCL 150 MG TABS: 150 | 30 days supply | Qty: 30 | Fill #4

## 2016-08-27 MED FILL — FLUoxetine HCL 20 MG CAPS: 20 | 30 days supply | Qty: 30 | Fill #4

## 2016-08-27 MED FILL — SIMVASTATIN 40 MG TABLET: 40 | 30 days supply | Qty: 30 | Fill #4

## 2016-10-02 MED FILL — raNITIdine HCL 150 MG TABS: 150 | 30 days supply | Qty: 30 | Fill #5

## 2016-10-02 MED FILL — METOPROLOL SUCC ER 100 MG T: 100 | 30 days supply | Qty: 30 | Fill #5

## 2016-10-02 MED FILL — AMITRIPTYLINE HCL 25 MG TAB: 25 | 30 days supply | Qty: 30 | Fill #5

## 2016-10-02 MED FILL — SIMVASTATIN 40 MG TABLET: 40 | 30 days supply | Qty: 30 | Fill #5

## 2016-10-02 MED FILL — FLUoxetine HCL 20 MG CAPS: 20 | 30 days supply | Qty: 30 | Fill #5

## 2016-11-01 MED FILL — AMITRIPTYLINE HCL 25 MG TAB: 25 | 30 days supply | Qty: 30 | Fill #6

## 2016-11-08 MED FILL — CHLORTHALIDONE 25 MG TABLET: 25 | 30 days supply | Qty: 30 | Fill #0

## 2016-11-08 MED FILL — PAROXETINE ER 25 MG TABLET: 25 | 30 days supply | Qty: 30 | Fill #0

## 2016-11-08 MED FILL — SIMVASTATIN 40 MG TABLET: 40 | 30 days supply | Qty: 30 | Fill #0

## 2016-11-08 MED FILL — raNITIdine HCL 150 MG TABS: 150 | 30 days supply | Qty: 30 | Fill #0

## 2016-11-08 MED FILL — METOPROLOL SUCC ER 100 MG T: 100 | 30 days supply | Qty: 30 | Fill #0

## 2016-11-15 ENCOUNTER — Encounter (HOSPITAL_BASED_OUTPATIENT_CLINIC_OR_DEPARTMENT_OTHER): Payer: Self-pay | Admitting: *Deleted

## 2016-11-15 ENCOUNTER — Emergency Department (HOSPITAL_BASED_OUTPATIENT_CLINIC_OR_DEPARTMENT_OTHER): Payer: Self-pay

## 2016-11-15 ENCOUNTER — Emergency Department (HOSPITAL_BASED_OUTPATIENT_CLINIC_OR_DEPARTMENT_OTHER)
Admission: EM | Admit: 2016-11-15 | Discharge: 2016-11-15 | Disposition: A | Discharge status: Home / Self Care | Payer: Self-pay | Attending: Emergency Medicine | Admitting: Emergency Medicine

## 2016-11-15 DIAGNOSIS — J45909 Unspecified asthma, uncomplicated: Secondary | ICD-10-CM | POA: Insufficient documentation

## 2016-11-15 DIAGNOSIS — Z7982 Long term (current) use of aspirin: Secondary | ICD-10-CM | POA: Insufficient documentation

## 2016-11-15 DIAGNOSIS — B9689 Other specified bacterial agents as the cause of diseases classified elsewhere: Secondary | ICD-10-CM

## 2016-11-15 DIAGNOSIS — I10 Essential (primary) hypertension: Secondary | ICD-10-CM | POA: Insufficient documentation

## 2016-11-15 DIAGNOSIS — N760 Acute vaginitis: Secondary | ICD-10-CM | POA: Insufficient documentation

## 2016-11-15 DIAGNOSIS — R1030 Lower abdominal pain, unspecified: Secondary | ICD-10-CM | POA: Insufficient documentation

## 2016-11-15 DIAGNOSIS — Z79899 Other long term (current) drug therapy: Secondary | ICD-10-CM | POA: Insufficient documentation

## 2016-11-15 HISTORY — DX: Endometriosis, unspecified: N80.9

## 2016-11-15 HISTORY — DX: Acquired absence of other genital organ(s): Z90.79

## 2016-11-15 LAB — COMPREHENSIVE METABOLIC PANEL
ALK PHOS: 79 U/L (ref 38–126)
ALT: 21 U/L (ref 14–54)
AST: 25 U/L (ref 15–41)
Albumin: 4.7 g/dL (ref 3.5–5.0)
Anion gap: 9 (ref 5–15)
BILIRUBIN TOTAL: 0.3 mg/dL (ref 0.3–1.2)
BUN: 25 mg/dL — AB (ref 6–20)
CO2: 26 mmol/L (ref 22–32)
CREATININE: 1.18 mg/dL — AB (ref 0.44–1.00)
Calcium: 9.4 mg/dL (ref 8.9–10.3)
Chloride: 100 mmol/L — ABNORMAL LOW (ref 101–111)
GFR calc Af Amer: >60 mL/min (ref >60)
GFR, EST NON AFRICAN AMERICAN: 54 mL/min — AB (ref >60)
GLUCOSE: 97 mg/dL (ref 65–99)
Potassium: 3.7 mmol/L (ref 3.5–5.1)
Sodium: 135 mmol/L (ref 135–145)
TOTAL PROTEIN: 8.8 g/dL — AB (ref 6.5–8.1)

## 2016-11-15 LAB — URINALYSIS, ROUTINE W REFLEX MICROSCOPIC
Bilirubin Urine: NEGATIVE
GLUCOSE, UA: NEGATIVE mg/dL
HGB URINE DIPSTICK: NEGATIVE
Ketones, ur: NEGATIVE mg/dL
Leukocytes, UA: NEGATIVE
Nitrite: NEGATIVE
PH: 5 (ref 5.0–8.0)
Protein, ur: NEGATIVE mg/dL
SPECIFIC GRAVITY, URINE: 1.021 (ref 1.005–1.030)

## 2016-11-15 LAB — WET PREP, GENITAL
Sperm: NONE SEEN
Trich, Wet Prep: NONE SEEN
Yeast Wet Prep HPF POC: NONE SEEN

## 2016-11-15 LAB — CBC WITH DIFFERENTIAL/PLATELET
BASOS ABS: 0 10*3/uL (ref 0.0–0.1)
Basophils Relative: 1 %
EOS PCT: 4 %
Eosinophils Absolute: 0.3 10*3/uL (ref 0.0–0.7)
HEMATOCRIT: 39 % (ref 36.0–46.0)
Hemoglobin: 12.6 g/dL (ref 12.0–15.0)
LYMPHS ABS: 2 10*3/uL (ref 0.7–4.0)
LYMPHS PCT: 28 %
MCH: 28.4 pg (ref 26.0–34.0)
MCHC: 32.3 g/dL (ref 30.0–36.0)
MCV: 87.8 fL (ref 78.0–100.0)
MONO ABS: 0.9 10*3/uL (ref 0.1–1.0)
Monocytes Relative: 12 %
NEUTROS ABS: 3.9 10*3/uL (ref 1.7–7.7)
Neutrophils Relative %: 55 %
PLATELETS: 271 10*3/uL (ref 150–400)
RBC: 4.44 MIL/uL (ref 3.87–5.11)
RDW: 13.6 % (ref 11.5–15.5)
WBC: 7.1 10*3/uL (ref 4.0–10.5)

## 2016-11-15 LAB — PREGNANCY, URINE: Preg Test, Ur: NEGATIVE

## 2016-11-15 MED ORDER — MORPHINE SULFATE (PF) 4 MG/ML IV SOLN
4.0000 mg | Freq: Once | INTRAVENOUS | Status: AC
  Administered 2016-11-15: 4 mg via INTRAVENOUS
  Filled 2016-11-15: qty 1

## 2016-11-15 MED ORDER — ONDANSETRON 8 MG PO TBDP: 8.0000 mg | ORAL_TABLET | Freq: Three times a day (TID) | ORAL | 0 refills | Status: DC | PRN

## 2016-11-15 MED ORDER — DOXYCYCLINE HYCLATE 100 MG PO CAPS: 100.0000 mg | ORAL_CAPSULE | Freq: Two times a day (BID) | ORAL | 0 refills | Status: AC

## 2016-11-15 MED ORDER — ONDANSETRON HCL 4 MG/2ML IJ SOLN
4.0000 mg | Freq: Once | INTRAMUSCULAR | Status: AC
  Administered 2016-11-15: 4 mg via INTRAVENOUS
  Filled 2016-11-15: qty 2

## 2016-11-15 MED ORDER — AZITHROMYCIN 250 MG PO TABS
1000.0000 mg | ORAL_TABLET | Freq: Once | ORAL | Status: AC
Start: 2016-11-15 — End: 2016-11-15
  Administered 2016-11-15: 1000 mg via ORAL
  Filled 2016-11-15: qty 4

## 2016-11-15 MED ORDER — METRONIDAZOLE 500 MG PO TABS: 500.0000 mg | ORAL_TABLET | Freq: Two times a day (BID) | ORAL | 0 refills | Status: DC

## 2016-11-15 MED ORDER — SODIUM CHLORIDE 0.9 % IV BOLUS (SEPSIS)
1000.0000 mL | Freq: Once | INTRAVENOUS | Status: AC
  Administered 2016-11-15: 1000 mL via INTRAVENOUS

## 2016-11-15 MED ORDER — DEXTROSE 5 % IV SOLN
1.0000 g | Freq: Once | INTRAVENOUS | Status: AC
  Administered 2016-11-15: 1 g via INTRAVENOUS
  Filled 2016-11-15: qty 10

## 2016-11-15 NOTE — ED Provider Notes (Signed)
[facility] Provider Note   CSN: [id number] Arrival date & time: 11/15/16  1546     History   Chief Complaint Chief Complaint  Patient presents with  . Abdominal Pain    HPI Katherine Wiggins is a 51 y.o. female.  HPI   Katherine Wiggins is a 51 y.o. female, with a history of Asthma, fibromyalgia, RA, and hypertension, presenting to the ED with lower abdominal pain for the past 2 weeks. Pain is constant, bilateral lower abdomen, rated 8/10, radiates to bilateral lower back. Accompanied by vaginal pain and dyspareunia. She has had similar, but lesser pain with yeast infections in the past. She used OTC Monistat 7 with no improvement. She has been taking ibuprofen without improvement. Has a consultation with Dr. Tamala Julian upstairs on Monday, 8/27, but states she couldn't wait that long.  Had some abnormal vaginal bleeding after her period ended last week. Sexual active with one female partner.   Denies vomiting, diarrhea, fever/chills, shortness of breath, or any other complaints.    Past Medical History:  Diagnosis Date  . Asthma   . Depression   . Endometriosis   . Fibromyalgia   . High cholesterol   . History of salpingectomy    Right  . Hypertension   . RA (rheumatoid arthritis) (HCC)     There are no active problems to display for this patient.   Past Surgical History:  Procedure Laterality Date  . CERVICAL SPINE SURGERY    . RIGHT OOPHORECTOMY Right 1995  . SALPINGECTOMY Right 1995  . TUBAL LIGATION      OB History    Gravida Para Term Preterm AB Living   5 3 3     3    SAB TAB Ectopic Multiple Live Births                   Home Medications    Prior to Admission medications   Medication Sig Start Date End Date Taking? Authorizing Provider  amitriptyline (ELAVIL) 25 MG tablet Take 25 mg by mouth at bedtime.    [provider]  aspirin 81 MG tablet Take 81 mg by mouth daily.    [provider]  doxycycline (VIBRAMYCIN) 100 MG  capsule Take 1 capsule (100 mg total) by mouth 2 (two) times daily. 11/15/16 11/29/16  Joy, Shawn C, PA-C  fexofenadine (ALLEGRA) 60 MG tablet Take 1 tablet (60 mg total) by mouth 2 (two) times daily. 05/30/13   Malvin Johns, MD  FLUoxetine (PROZAC) 20 MG tablet Take 20 mg by mouth daily.    [provider]  fluticasone (FLONASE) 50 MCG/ACT nasal spray Place 2 sprays into both nostrils daily. 07/30/16 08/13/16  Long, Wonda Olds, MD  ibuprofen (ADVIL,MOTRIN) 800 MG tablet Take 1 tablet (800 mg total) by mouth 3 (three) times daily. 12/15/15   Quintella Reichert, MD  metoprolol (TOPROL-XL) 50 MG 24 hr tablet Take 50 mg by mouth daily.      [provider]  metroNIDAZOLE (FLAGYL) 500 MG tablet Take 1 tablet (500 mg total) by mouth 2 (two) times daily. 11/15/16   Joy, Shawn C, PA-C  ondansetron (ZOFRAN-ODT) 8 MG disintegrating tablet Take 1 tablet (8 mg total) by mouth every 8 (eight) hours as needed for nausea or vomiting. 11/15/16   Joy, Shawn C, PA-C  predniSONE (STERAPRED UNI-PAK 21 TAB) 10 MG (21) TBPK tablet Take by mouth daily. Take 6 tabs by mouth daily  for 2 days, then 5 tabs for 2 days, then  4 tabs for 2 days, then 3 tabs for 2 days, 2 tabs for 2 days, then 1 tab by mouth daily for 2 days 06/11/16   Rodell Perna A, PA-C  simvastatin (ZOCOR) 40 MG tablet Take 40 mg by mouth at bedtime.      [provider]    Family History Family History  Problem Relation Age of Onset  . Diabetes Mother   . Hypertension Mother   . Diabetes Sister   . Hypertension Sister   . Hypertension Brother     Social History Social History  Substance Use Topics  . Smoking status: Never Smoker  . Smokeless tobacco: Never Used  . Alcohol use No     Allergies   Codeine; Macrodantin [nitrofurantoin]; and Septra [sulfamethoxazole w/trimethoprim (co-trimoxazole)]   Review of Systems Review of Systems  Constitutional: Negative for chills, diaphoresis and fever.  Respiratory: Negative for  shortness of breath.   Cardiovascular: Negative for chest pain.  Gastrointestinal: Positive for abdominal pain and nausea. Negative for blood in stool, diarrhea and vomiting.  Genitourinary: Positive for dyspareunia, pelvic pain, vaginal bleeding and vaginal discharge. Negative for difficulty urinating.  All other systems reviewed and are negative.    Physical Exam Updated Vital Signs BP 113/69   Pulse 76   Temp 98.3 F (36.8 C) (Oral)   Resp 20   Ht 5\' 3"  (1.6 m)   Wt 93.4 kg (206 lb)   LMP 11/08/2016   SpO2 99%   BMI 36.49 kg/m   Physical Exam  Constitutional: She appears well-developed and well-nourished. No distress.  HENT:  Head: Normocephalic and atraumatic.  Eyes: Conjunctivae are normal.  Neck: Neck supple.  Cardiovascular: Normal rate, regular rhythm, normal heart sounds and intact distal pulses.   Pulmonary/Chest: Effort normal and breath sounds normal. No respiratory distress.  Abdominal: Soft. There is tenderness in the right lower quadrant, suprapubic area and left lower quadrant. There is no guarding.  Genitourinary: Cervix exhibits motion tenderness. Right adnexum displays tenderness. Left adnexum displays tenderness. Vaginal discharge found.  Genitourinary Comments: External genitalia normal Vagina with discharge - Moderate yellow discharge in vaginal vault Cervix  normal positive for cervical motion tenderness  Adnexa palpated, no masses, positive for tenderness bilaterally Bladder palpated positive for tenderness Uterus palpated no masses, positive for tenderness  RN, Misty, served as chaperone during exam.  Musculoskeletal: She exhibits no edema.  Lymphadenopathy:    She has no cervical adenopathy.       Right: No inguinal adenopathy present.       Left: No inguinal adenopathy present.  Neurological: She is alert.  Skin: Skin is warm and dry. She is not diaphoretic.  Psychiatric: She has a normal mood and affect. Her behavior is normal.  Nursing note  and vitals reviewed.    ED Treatments / Results  Labs (all labs ordered are listed, but only abnormal results are displayed) Labs Reviewed  WET PREP, GENITAL - Abnormal; Notable for the following:       Result Value   Clue Cells Wet Prep HPF POC PRESENT (*)    WBC, Wet Prep HPF POC MANY (*)    All other components within normal limits  COMPREHENSIVE METABOLIC PANEL - Abnormal; Notable for the following:    Chloride 100 (*)    BUN 25 (*)    Creatinine, Ser 1.18 (*)    Total Protein 8.8 (*)    GFR calc non Af Amer 54 (*)    All other components within normal  limits  URINALYSIS, ROUTINE W REFLEX MICROSCOPIC  PREGNANCY, URINE  CBC WITH DIFFERENTIAL/PLATELET  RPR  HIV ANTIBODY (ROUTINE TESTING)  GC/CHLAMYDIA PROBE AMP ([place]) NOT AT [facility]    EKG  EKG Interpretation None       Radiology US Transvaginal Non-ob  Result Date: 11/15/2016 CLINICAL DATA:  Pelvic pain for 2 weeks.  Vaginal bleeding. EXAM: TRANSABDOMINAL AND TRANSVAGINAL ULTRASOUND OF PELVIS TECHNIQUE: Both transabdominal and transvaginal ultrasound examinations of the pelvis were performed. Transabdominal technique was performed for global imaging of the pelvis including uterus, ovaries, adnexal regions, and pelvic cul-de-sac. It was necessary to proceed with endovaginal exam following the transabdominal exam to visualize the endometrium and ovaries. COMPARISON:  Ultrasound of February 10, 2016. FINDINGS: Uterus Measurements: 10.0 x 6.9 x 5.4 cm. Two fibroids are noted, with the largest measuring 1.4 cm posteriorly. Endometrium Thickness: 15 mm which is within normal limits for patient of reproductive age. No focal abnormality visualized. Right ovary Surgically removed. Left ovary Measurements: 3.6 x 2.2 x 1.9 cm. Normal appearance/no adnexal mass. Other findings No abnormal free fluid. IMPRESSION: Small uterine fibroids. Status post right oophorectomy. No acute abnormality seen in the pelvis. Electronically Signed    By: Marijo Conception, M.D.   On: 11/15/2016 20:43   US Pelvis Complete  Result Date: 11/15/2016 CLINICAL DATA:  Pelvic pain for 2 weeks.  Vaginal bleeding. EXAM: TRANSABDOMINAL AND TRANSVAGINAL ULTRASOUND OF PELVIS TECHNIQUE: Both transabdominal and transvaginal ultrasound examinations of the pelvis were performed. Transabdominal technique was performed for global imaging of the pelvis including uterus, ovaries, adnexal regions, and pelvic cul-de-sac. It was necessary to proceed with endovaginal exam following the transabdominal exam to visualize the endometrium and ovaries. COMPARISON:  Ultrasound of February 10, 2016. FINDINGS: Uterus Measurements: 10.0 x 6.9 x 5.4 cm. Two fibroids are noted, with the largest measuring 1.4 cm posteriorly. Endometrium Thickness: 15 mm which is within normal limits for patient of reproductive age. No focal abnormality visualized. Right ovary Surgically removed. Left ovary Measurements: 3.6 x 2.2 x 1.9 cm. Normal appearance/no adnexal mass. Other findings No abnormal free fluid. IMPRESSION: Small uterine fibroids. Status post right oophorectomy. No acute abnormality seen in the pelvis. Electronically Signed   By: Marijo Conception, M.D.   On: 11/15/2016 20:43    Procedures Pelvic exam Date/Time: 11/15/2016 6:26 PM Performed by: Lorayne Bender Authorized by: Arlean Hopping C  Consent: Verbal consent obtained. Risks and benefits: risks, benefits and alternatives were discussed Consent given by: patient Patient understanding: patient states understanding of the procedure being performed Patient consent: the patient's understanding of the procedure matches consent given Procedure consent: procedure consent matches procedure scheduled Patient identity confirmed: verbally with patient Local anesthesia used: no  Anesthesia: Local anesthesia used: no  Sedation: Patient sedated: no Patient tolerance: Patient tolerated the procedure well with no immediate complications     (including critical care time)  Medications Ordered in ED Medications  sodium chloride 0.9 % bolus 1,000 mL (0 mLs Intravenous Stopped 11/15/16 2130)  cefTRIAXone (ROCEPHIN) 1 g in dextrose 5 % 50 mL IVPB (0 g Intravenous Stopped 11/15/16 2002)  azithromycin (ZITHROMAX) tablet 1,000 mg (1,000 mg Oral Given 11/15/16 1854)  morphine 4 MG/ML injection 4 mg (4 mg Intravenous Given 11/15/16 1854)  ondansetron (ZOFRAN) injection 4 mg (4 mg Intravenous Given 11/15/16 1854)   Patient states she has had morphine in the past without issue despite her allergy list.  Initial Impression / Assessment and Plan / ED Course  I  have reviewed the triage vital signs and the nursing notes.  Pertinent labs & imaging results that were available during my care of the patient were reviewed by me and considered in my medical decision making (see chart for details).     Patient presents with lower abdominal pain. Patient is nontoxic appearing, afebrile, not tachycardic, not tachypneic, not hypotensive, maintains SPO2 of 99% on room air, and is in no apparent distress. Patient has no signs of sepsis or other serious or life-threatening condition. BV and exam suspicious for PID. Both issues addressed. OB/GYN follow-up. The patient was given instructions for home care as well as return precautions. Patient voices understanding of these instructions, accepts the plan, and is comfortable with discharge.  Vitals:   11/15/16 1552 11/15/16 1555 11/15/16 2045  BP:  113/69 113/61  Pulse:  76 72  Resp:  20 16  Temp:  98.3 F (36.8 C)   TempSrc:  Oral   SpO2:  99% 99%  Weight: 93.4 kg (206 lb)    Height: 5\' 3"  (1.6 m)       Final Clinical Impressions(s) / ED Diagnoses   Final diagnoses:  Lower abdominal pain  BV (bacterial vaginosis)    New Prescriptions Discharge Medication List as of 11/15/2016  9:26 PM    START taking these medications   Details  doxycycline (VIBRAMYCIN) 100 MG capsule Take 1 capsule (100 mg  total) by mouth 2 (two) times daily., Starting Fri 11/15/2016, Until Fri 11/29/2016, Print    metroNIDAZOLE (FLAGYL) 500 MG tablet Take 1 tablet (500 mg total) by mouth 2 (two) times daily., Starting Fri 11/15/2016, Print    ondansetron (ZOFRAN-ODT) 8 MG disintegrating tablet Take 1 tablet (8 mg total) by mouth every 8 (eight) hours as needed for nausea or vomiting., Starting Fri 11/15/2016, Print         Joy, [place] C, PA-C 11/16/16 0019    Davonna Belling, MD 11/17/16 [phone]

## 2016-11-15 NOTE — ED Triage Notes (Signed)
Lower abdominal pain. Abnormal vaginal bleeding. Back pain.

## 2016-11-15 NOTE — ED Notes (Signed)
Patient transported to Ultrasound 

## 2016-11-15 NOTE — Discharge Instructions (Addendum)
There was evidence of bacterial vaginosis on wet prep. There is also evidence of what is known as pelvic inflammatory disease. This has many possible sources, including bacterial vaginosis. However, because of the other possible sources of PID, another antibiotic will be added. There was also some evidence of dehydration on your lab work so please increase your water intake. Please take all of your antibiotics until finished!   You may develop abdominal discomfort or diarrhea from the antibiotic.  You may help offset this with probiotics which you can buy or get in yogurt. Do not eat or take the probiotics until 2 hours after your antibiotic.  Antiinflammatory medications: Take 600 mg of ibuprofen every 6 hours or 440 mg (over the counter dose) to 500 mg (prescription dose) of naproxen every 12 hours or for the next 3 days. After this time, these medications may be used as needed for pain. Take these medications with food to avoid upset stomach. Choose only one of these medications, do not take them together. Tylenol: Should you continue to have additional pain while taking the ibuprofen or naproxen, you may add in tylenol as needed. Your daily total maximum amount of tylenol from all sources should be limited to 4000mg /day for persons without liver problems, or 2000mg /day for those with liver problems. Zofran: May use Zofran as needed for nausea.  Follow-up with OB/GYN as soon as possible on this matter. Should symptoms worsen, proceed directly to the emergency department at [facility].

## 2016-11-16 ENCOUNTER — Encounter (HOSPITAL_BASED_OUTPATIENT_CLINIC_OR_DEPARTMENT_OTHER): Payer: Self-pay | Admitting: Emergency Medicine

## 2016-11-17 LAB — RPR: RPR Ser Ql: NONREACTIVE

## 2016-11-17 LAB — HIV ANTIBODY (ROUTINE TESTING W REFLEX): HIV Screen 4th Generation wRfx: NONREACTIVE

## 2016-11-18 ENCOUNTER — Encounter: Payer: Self-pay | Admitting: Obstetrics & Gynecology

## 2016-11-18 LAB — GC/CHLAMYDIA PROBE AMP (~~LOC~~) NOT AT ARMC
CHLAMYDIA, DNA PROBE: NEGATIVE
NEISSERIA GONORRHEA: NEGATIVE

## 2016-11-18 MED FILL — metroNIDAZOLE 500 MG TABS: 500 | 7 days supply | Qty: 14 | Fill #0

## 2016-11-18 MED FILL — DOXYCYCLINE HYCLATE 100 MG: 100 | 14 days supply | Qty: 28 | Fill #0

## 2016-11-18 MED FILL — ONDANSETRON ODT 8 MG TABLET: 8 | 5 days supply | Qty: 15 | Fill #0

## 2016-12-11 ENCOUNTER — Encounter: Payer: Self-pay | Admitting: Obstetrics & Gynecology

## 2016-12-11 DIAGNOSIS — Z01419 Encounter for gynecological examination (general) (routine) without abnormal findings: Secondary | ICD-10-CM

## 2016-12-11 MED FILL — CHLORTHALIDONE 25 MG TABLET: 25 | 30 days supply | Qty: 30 | Fill #1

## 2016-12-11 MED FILL — PAROXETINE ER 25 MG TABLET: 25 | 30 days supply | Qty: 30 | Fill #1

## 2016-12-11 MED FILL — SIMVASTATIN 40 MG TABLET: 40 | 30 days supply | Qty: 30 | Fill #1

## 2016-12-11 MED FILL — raNITIdine HCL 150 MG TABS: 150 | 30 days supply | Qty: 30 | Fill #1

## 2017-01-13 MED FILL — SIMVASTATIN 40 MG TABLET: 40 | 30 days supply | Qty: 30 | Fill #2

## 2017-01-13 MED FILL — raNITIdine HCL 150 MG TABS: 150 | 30 days supply | Qty: 30 | Fill #2

## 2017-01-13 MED FILL — PAROXETINE ER 25 MG TABLET: 25 | 30 days supply | Qty: 30 | Fill #2

## 2017-01-13 MED FILL — CHLORTHALIDONE 25 MG TABS: 25 | 30 days supply | Qty: 30 | Fill #2

## 2017-01-13 MED FILL — AMITRIPTYLINE HCL 25 MG TAB: 25 | 30 days supply | Qty: 30 | Fill #7

## 2017-01-17 MED FILL — AMOXICILLIN 500 MG CAPSULE: 500 | 7 days supply | Qty: 21 | Fill #0

## 2017-01-28 ENCOUNTER — Emergency Department (HOSPITAL_BASED_OUTPATIENT_CLINIC_OR_DEPARTMENT_OTHER): Payer: Self-pay

## 2017-01-28 ENCOUNTER — Other Ambulatory Visit: Payer: Self-pay

## 2017-01-28 ENCOUNTER — Encounter (HOSPITAL_BASED_OUTPATIENT_CLINIC_OR_DEPARTMENT_OTHER): Payer: Self-pay | Admitting: *Deleted

## 2017-01-28 ENCOUNTER — Emergency Department (HOSPITAL_BASED_OUTPATIENT_CLINIC_OR_DEPARTMENT_OTHER)
Admission: EM | Admit: 2017-01-28 | Discharge: 2017-01-28 | Disposition: A | Discharge status: Home / Self Care | Payer: Self-pay | Attending: Emergency Medicine | Admitting: Emergency Medicine

## 2017-01-28 DIAGNOSIS — W06XXXA Fall from bed, initial encounter: Secondary | ICD-10-CM | POA: Insufficient documentation

## 2017-01-28 DIAGNOSIS — Z7982 Long term (current) use of aspirin: Secondary | ICD-10-CM | POA: Insufficient documentation

## 2017-01-28 DIAGNOSIS — I10 Essential (primary) hypertension: Secondary | ICD-10-CM | POA: Insufficient documentation

## 2017-01-28 DIAGNOSIS — J45909 Unspecified asthma, uncomplicated: Secondary | ICD-10-CM | POA: Insufficient documentation

## 2017-01-28 DIAGNOSIS — S39012A Strain of muscle, fascia and tendon of lower back, initial encounter: Secondary | ICD-10-CM | POA: Insufficient documentation

## 2017-01-28 DIAGNOSIS — Y939 Activity, unspecified: Secondary | ICD-10-CM | POA: Insufficient documentation

## 2017-01-28 DIAGNOSIS — M5412 Radiculopathy, cervical region: Secondary | ICD-10-CM | POA: Insufficient documentation

## 2017-01-28 DIAGNOSIS — Z79899 Other long term (current) drug therapy: Secondary | ICD-10-CM | POA: Insufficient documentation

## 2017-01-28 DIAGNOSIS — Y999 Unspecified external cause status: Secondary | ICD-10-CM | POA: Insufficient documentation

## 2017-01-28 DIAGNOSIS — Y92092 Bedroom in other non-institutional residence as the place of occurrence of the external cause: Secondary | ICD-10-CM | POA: Insufficient documentation

## 2017-01-28 MED ORDER — METHYLPREDNISOLONE 4 MG PO TBPK: ORAL_TABLET | ORAL | 0 refills | Status: DC

## 2017-01-28 NOTE — ED Notes (Signed)
ED Provider at bedside. 

## 2017-01-28 NOTE — ED Triage Notes (Signed)
For a week she has had pain in the right side of her neck with numbness in her right arm and hand. Pain in her right flank into her thigh. She feels it is a bulging disc.

## 2017-01-28 NOTE — Discharge Instructions (Signed)
You can take tylenol or ibuprofen available over the counter according to label instructions as needed for pain.

## 2017-01-28 NOTE — ED Notes (Signed)
Pt verbalized understanding of discharge instructions and denies any further questions at this time.   

## 2017-01-28 NOTE — ED Provider Notes (Signed)
[facility] EMERGENCY DEPARTMENT Provider Note   CSN: [id number] Arrival date & time: 01/28/17  1052     History   Chief Complaint No chief complaint on file.   HPI Katherine Wiggins is a 51 y.o. female.  The history is provided by the patient. No language interpreter was used.   Katherine Wiggins is a 51 y.o. female who presents to the Emergency Department complaining of neck and hip pain.  She rolled out of bed.  Last Wednesday she developed pain in the right posterior lateral neck as well as the right lateral low back.  Pain is constant in nature but worse with movement.  She states in the neck that there is tingling sensation down her right arm.  No numbness, weakness, chest pain, shortness of breath, abdominal pain.  No prior similar symptoms.  Are mild to moderate nature.  The pain in her right low back radiates to her right groin. Past Medical History:  Diagnosis Date  . Asthma   . Depression   . Endometriosis   . Fibromyalgia   . High cholesterol   . History of salpingectomy    Right  . Hypertension   . RA (rheumatoid arthritis) (HCC)     There are no active problems to display for this patient.   Past Surgical History:  Procedure Laterality Date  . CERVICAL SPINE SURGERY    . RIGHT OOPHORECTOMY Right 1995  . SALPINGECTOMY Right 1995  . TUBAL LIGATION      OB History    Gravida Para Term Preterm AB Living   5 3 3     3    SAB TAB Ectopic Multiple Live Births                   Home Medications    Prior to Admission medications   Medication Sig Start Date End Date Taking? Authorizing Provider  amitriptyline (ELAVIL) 25 MG tablet Take 25 mg by mouth at bedtime.    [provider]  aspirin 81 MG tablet Take 81 mg by mouth daily.    [provider]  fexofenadine (ALLEGRA) 60 MG tablet Take 1 tablet (60 mg total) by mouth 2 (two) times daily. 05/30/13   Malvin Johns, MD  FLUoxetine (PROZAC) 20 MG tablet Take 20 mg by mouth daily.     [provider]  fluticasone (FLONASE) 50 MCG/ACT nasal spray Place 2 sprays into both nostrils daily. 07/30/16 08/13/16  Long, Wonda Olds, MD  ibuprofen (ADVIL,MOTRIN) 800 MG tablet Take 1 tablet (800 mg total) by mouth 3 (three) times daily. 12/15/15   Quintella Reichert, MD  methylPREDNISolone (MEDROL DOSEPAK) 4 MG TBPK tablet Take according to label instructions 01/28/17   Quintella Reichert, MD  metoprolol (TOPROL-XL) 50 MG 24 hr tablet Take 50 mg by mouth daily.      [provider]  metroNIDAZOLE (FLAGYL) 500 MG tablet Take 1 tablet (500 mg total) by mouth 2 (two) times daily. 11/15/16   Joy, Shawn C, PA-C  ondansetron (ZOFRAN-ODT) 8 MG disintegrating tablet Take 1 tablet (8 mg total) by mouth every 8 (eight) hours as needed for nausea or vomiting. 11/15/16   Joy, Shawn C, PA-C  predniSONE (STERAPRED UNI-PAK 21 TAB) 10 MG (21) TBPK tablet Take by mouth daily. Take 6 tabs by mouth daily  for 2 days, then 5 tabs for 2 days, then 4 tabs for 2 days, then 3 tabs for 2 days, 2 tabs for 2 days, then 1 tab by mouth  daily for 2 days 06/11/16   Rodell Perna A, PA-C  simvastatin (ZOCOR) 40 MG tablet Take 40 mg by mouth at bedtime.      [provider]    Family History Family History  Problem Relation Age of Onset  . Diabetes Mother   . Hypertension Mother   . Diabetes Sister   . Hypertension Sister   . Hypertension Brother     Social History Social History   Tobacco Use  . Smoking status: Never Smoker  . Smokeless tobacco: Never Used  Substance Use Topics  . Alcohol use: No  . Drug use: No     Allergies   Codeine; Macrodantin [nitrofurantoin]; and Septra [sulfamethoxazole w/trimethoprim (co-trimoxazole)]   Review of Systems Review of Systems  All other systems reviewed and are negative.    Physical Exam Updated Vital Signs BP 111/70   Pulse 67   Temp 98.3 F (36.8 C) (Oral)   Resp 20   Ht 5\' 3"  (1.6 m)   Wt 93.4 kg (206 lb)   LMP 01/20/2017   SpO2 99%    BMI 36.49 kg/m   Physical Exam  Constitutional: She is oriented to person, place, and time. She appears well-developed and well-nourished.  HENT:  Head: Normocephalic and atraumatic.  Cardiovascular: Normal rate and regular rhythm.  No murmur heard. Pulmonary/Chest: Effort normal and breath sounds normal. No respiratory distress.  Abdominal: Soft. There is no tenderness. There is no rebound and no guarding.  Musculoskeletal: She exhibits no edema.  There is no midline C, T, L-spine tenderness.  There is tenderness to palpation over the right lateral cervical paraspinous muscles as well as over the right SI joint.  Neurological: She is alert and oriented to person, place, and time.  5 Out of 5 strength in all 4 extremities with sensation to light touch intact in all 4 extremities  Skin: Skin is warm and dry.  Psychiatric: She has a normal mood and affect. Her behavior is normal.  Nursing note and vitals reviewed.    ED Treatments / Results  Labs (all labs ordered are listed, but only abnormal results are displayed) Labs Reviewed - No data to display  EKG  EKG Interpretation None       Radiology Dg Hip Unilat W Or Wo Pelvis 2-3 Views Right  Result Date: 01/28/2017 CLINICAL DATA:  Right hip pain after fall last week. EXAM: DG HIP (WITH OR WITHOUT PELVIS) 2-3V RIGHT COMPARISON:  Radiographs of February 22, 2015. FINDINGS: There is no evidence of hip fracture or dislocation. Possible sclerosis and lucency is noted in the right femoral head which suggests the possibility of avascular necrosis. IMPRESSION: No fracture or dislocation is noted. Possible sclerosis and focal lucency is noted in the right femoral head suggesting possible avascular necrosis. MRI may be performed for further evaluation. Electronically Signed   By: Marijo Conception, M.D.   On: 01/28/2017 12:14    Procedures Procedures (including critical care time)  Medications Ordered in ED Medications - No data to  display   Initial Impression / Assessment and Plan / ED Course  I have reviewed the triage vital signs and the nursing notes.  Pertinent labs & imaging results that were available during my care of the patient were reviewed by me and considered in my medical decision making (see chart for details).     Here for evaluation of right-sided neck pain and low back pain that happened 2 days after a fall occurred.  Presentation is  not consistent with acute fracture dislocation, dissection.  She is neurovascularly intact on examination.  Discussed with patient home care for cervical radiculopathy as well as lumbar strain.  Discussed outpatient follow-up and return precautions. Final Clinical Impressions(s) / ED Diagnoses   Final diagnoses:  Cervical radiculopathy  Strain of lumbar region, initial encounter    ED Discharge Orders        Ordered    methylPREDNISolone (MEDROL DOSEPAK) 4 MG TBPK tablet     01/28/17 1234       Quintella Reichert, MD 01/28/17 1239

## 2017-02-17 MED FILL — CHLORTHALIDONE 25 MG TABS: 25 | 30 days supply | Qty: 30 | Fill #3

## 2017-02-17 MED FILL — PAROXETINE ER 25 MG TABLET: 25 | 30 days supply | Qty: 30 | Fill #3

## 2017-02-17 MED FILL — raNITIdine HCL 150 MG TABS: 150 | 30 days supply | Qty: 30 | Fill #3

## 2017-02-17 MED FILL — METOPROLOL SUCC ER 100 MG T: 100 | 30 days supply | Qty: 30 | Fill #1

## 2017-02-17 MED FILL — SIMVASTATIN 40 MG TABLET: 40 | 30 days supply | Qty: 30 | Fill #3

## 2017-02-20 MED FILL — METHYLPREDNISOLONE 4 MG TAB: 4 | 6 days supply | Qty: 21 | Fill #0

## 2017-03-24 MED FILL — METOPROLOL SUCC ER 100 MG T: 100 | 30 days supply | Qty: 30 | Fill #2

## 2017-03-24 MED FILL — SIMVASTATIN 40 MG TABLET: 40 | 30 days supply | Qty: 30 | Fill #4

## 2017-03-24 MED FILL — CHLORTHALIDONE 25 MG TABS: 25 | 30 days supply | Qty: 30 | Fill #4

## 2017-03-24 MED FILL — PAROXETINE ER 25 MG TABLET: 25 | 30 days supply | Qty: 30 | Fill #4

## 2017-03-24 MED FILL — raNITIdine HCL 150 MG TABS: 150 | 30 days supply | Qty: 30 | Fill #4

## 2017-03-24 MED FILL — AMITRIPTYLINE HCL 25 MG TAB: 25 | 30 days supply | Qty: 30 | Fill #8

## 2017-04-22 MED FILL — SIMVASTATIN 40 MG TABLET: 40 | 30 days supply | Qty: 30 | Fill #5

## 2017-04-22 MED FILL — CHLORTHALIDONE 25 MG TAB: 25 | 30 days supply | Qty: 30 | Fill #5

## 2017-04-22 MED FILL — AMITRIPTYLINE HCL 25 MG TAB: 25 | 30 days supply | Qty: 30 | Fill #9

## 2017-04-22 MED FILL — raNITIdine HCL 150 MG TABS: 150 | 30 days supply | Qty: 30 | Fill #5

## 2017-04-22 MED FILL — METOPROLOL SUCC ER 100 MG T: 100 | 30 days supply | Qty: 30 | Fill #3

## 2017-04-24 MED FILL — TOBRAMYCIN 0.3 % SOLN: 0.3 | 13 days supply | Qty: 5 | Fill #0

## 2017-05-12 MED FILL — PANTOPRAZOLE SOD DR 20 MG T: 20 | 30 days supply | Qty: 30 | Fill #0

## 2017-05-12 MED FILL — CITALOPRAM HBR 40 MG TABLET: 40 | 30 days supply | Qty: 25 | Fill #0

## 2017-05-12 MED FILL — FLUTICASONE PROP 50 MCG SPR: 50 | 60 days supply | Qty: 16 | Fill #0

## 2017-05-12 MED FILL — AZITHROMYCIN 250 MG TABLET: 250 | 5 days supply | Qty: 6 | Fill #0

## 2017-05-26 MED FILL — METOPROLOL SUCC ER 100 MG T: 100 | 30 days supply | Qty: 30 | Fill #4

## 2017-05-26 MED FILL — CHLORTHALIDONE 25 MG TAB: 25 | 30 days supply | Qty: 30 | Fill #6

## 2017-05-28 MED FILL — SIMVASTATIN 40 MG TABLET: 40 | 30 days supply | Qty: 30 | Fill #0

## 2017-05-29 MED FILL — POTASSIUM CL ER 20 MEQ TAB: 20 | 30 days supply | Qty: 30 | Fill #0

## 2017-06-06 MED FILL — raNITIdine HCL 150 MG TABS: 150 | 30 days supply | Qty: 60 | Fill #0

## 2017-06-10 MED FILL — PANTOPRAZOLE SOD DR 20 MG T: 20 | 30 days supply | Qty: 30 | Fill #1

## 2017-06-10 MED FILL — CITALOPRAM HBR 40 MG TABLET: 40 | 30 days supply | Qty: 30 | Fill #1

## 2017-06-26 MED FILL — CHLORTHALIDONE 25 MG TAB: 25 | 30 days supply | Qty: 30 | Fill #7

## 2017-06-26 MED FILL — SIMVASTATIN 40 MG TABLET: 40 | 30 days supply | Qty: 30 | Fill #1

## 2017-06-30 MED FILL — METOPROLOL SUCCINATE ER 100: 100 | 30 days supply | Qty: 30 | Fill #5

## 2017-07-10 ENCOUNTER — Emergency Department (HOSPITAL_BASED_OUTPATIENT_CLINIC_OR_DEPARTMENT_OTHER): Payer: Self-pay

## 2017-07-10 ENCOUNTER — Emergency Department (HOSPITAL_BASED_OUTPATIENT_CLINIC_OR_DEPARTMENT_OTHER)
Admission: EM | Admit: 2017-07-10 | Discharge: 2017-07-10 | Disposition: A | Discharge status: Home / Self Care | Payer: Self-pay | Attending: Emergency Medicine | Admitting: Emergency Medicine

## 2017-07-10 ENCOUNTER — Encounter (HOSPITAL_BASED_OUTPATIENT_CLINIC_OR_DEPARTMENT_OTHER): Payer: Self-pay | Admitting: *Deleted

## 2017-07-10 ENCOUNTER — Other Ambulatory Visit: Payer: Self-pay

## 2017-07-10 DIAGNOSIS — I10 Essential (primary) hypertension: Secondary | ICD-10-CM | POA: Insufficient documentation

## 2017-07-10 DIAGNOSIS — M5412 Radiculopathy, cervical region: Secondary | ICD-10-CM | POA: Insufficient documentation

## 2017-07-10 DIAGNOSIS — M5432 Sciatica, left side: Secondary | ICD-10-CM | POA: Insufficient documentation

## 2017-07-10 DIAGNOSIS — M5431 Sciatica, right side: Secondary | ICD-10-CM | POA: Insufficient documentation

## 2017-07-10 DIAGNOSIS — Z79899 Other long term (current) drug therapy: Secondary | ICD-10-CM | POA: Insufficient documentation

## 2017-07-10 DIAGNOSIS — J45909 Unspecified asthma, uncomplicated: Secondary | ICD-10-CM | POA: Insufficient documentation

## 2017-07-10 MED ORDER — KETOROLAC TROMETHAMINE 60 MG/2ML IM SOLN
60.0000 mg | Freq: Once | INTRAMUSCULAR | Status: AC
Start: 2017-07-10 — End: 2017-07-10
  Administered 2017-07-10: 60 mg via INTRAMUSCULAR
  Filled 2017-07-10: qty 2

## 2017-07-10 MED ORDER — TRAMADOL HCL 50 MG PO TABS: 50.0000 mg | ORAL_TABLET | Freq: Four times a day (QID) | ORAL | 0 refills | Status: DC | PRN

## 2017-07-10 MED ORDER — PREDNISONE 50 MG PO TABS: 50.0000 mg | ORAL_TABLET | Freq: Every day | ORAL | 0 refills | Status: DC

## 2017-07-10 MED ORDER — CYCLOBENZAPRINE HCL 10 MG PO TABS: 10.0000 mg | ORAL_TABLET | Freq: Every day | ORAL | 0 refills | Status: DC

## 2017-07-10 MED FILL — predniSONE 50 MG TABS: 50 | 5 days supply | Qty: 5 | Fill #0

## 2017-07-10 MED FILL — traMADol HCL 50 MG TABS: 50 | 3 days supply | Qty: 15 | Fill #0

## 2017-07-10 MED FILL — CYCLOBENZAPRINE HCL 10 MG T: 10 | 10 days supply | Qty: 10 | Fill #0

## 2017-07-10 NOTE — Discharge Instructions (Signed)
Return here as needed.  Follow-up with your neurosurgeon.  Your x-rays indicate degenerative changes which can lead to worsening pain over time.

## 2017-07-10 NOTE — ED Triage Notes (Signed)
Limited ROM of her right arm for a week. Pain in the right side of her neck with limited movement. Lower back pain with radiation into both legs. She is ambulatory.

## 2017-07-10 NOTE — ED Notes (Signed)
Pt. Having no trouble with BMs  She reports.  Pt. Walking and driving.  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

## 2017-07-14 MED FILL — CITALOPRAM HBR 40 MG TABLET: 40 | 30 days supply | Qty: 30 | Fill #2

## 2017-07-14 NOTE — ED Provider Notes (Signed)
[facility] EMERGENCY DEPARTMENT Provider Note   CSN: [id number] Arrival date & time: 07/10/17  1318     History   Chief Complaint Chief Complaint  Patient presents with  . Back Pain    HPI Katherine Wiggins is a 52 y.o. female.  HPI Patient presents to the emergency department with right-sided neck pain with radiation into her arm.  The patient states that this started about a week ago.  She states she had similar problems in the past she states she does do a lot of lifting at work.  She states that she has radiation of low back pain into her upper legs.  She states nothing seems to make the condition better.  She states she did not take any medications prior to arrival for her symptoms.  Patient states that she is never had any neck surgeries.  The patient denies chest pain, shortness of breath, headache,blurred vision, fever, cough, weakness, numbness, dizziness, anorexia, edema, abdominal pain, nausea, vomiting, diarrhea, rash, back pain, dysuria, hematemesis, bloody stool, near syncope, or syncope. Past Medical History:  Diagnosis Date  . Asthma   . Depression   . Endometriosis   . Fibromyalgia   . High cholesterol   . History of salpingectomy    Right  . Hypertension   . RA (rheumatoid arthritis) (HCC)     There are no active problems to display for this patient.   Past Surgical History:  Procedure Laterality Date  . CERVICAL SPINE SURGERY    . RIGHT OOPHORECTOMY Right 1995  . SALPINGECTOMY Right 1995  . TUBAL LIGATION       OB History    Gravida  5   Para  3   Term  3   Preterm      AB      Living  3     SAB      TAB      Ectopic      Multiple      Live Births               Home Medications    Prior to Admission medications   Medication Sig Start Date End Date Taking? Authorizing Provider  amitriptyline (ELAVIL) 25 MG tablet Take 25 mg by mouth at bedtime.    [provider]  aspirin 81 MG tablet Take 81 mg by  mouth daily.    [provider]  cyclobenzaprine (FLEXERIL) 10 MG tablet Take 1 tablet (10 mg total) by mouth at bedtime. 07/10/17   Zade Falkner, Harrell Gave, PA-C  fexofenadine (ALLEGRA) 60 MG tablet Take 1 tablet (60 mg total) by mouth 2 (two) times daily. 05/30/13   Malvin Johns, MD  FLUoxetine (PROZAC) 20 MG tablet Take 20 mg by mouth daily.    [provider]  fluticasone (FLONASE) 50 MCG/ACT nasal spray Place 2 sprays into both nostrils daily. 07/30/16 08/13/16  Long, Wonda Olds, MD  ibuprofen (ADVIL,MOTRIN) 800 MG tablet Take 1 tablet (800 mg total) by mouth 3 (three) times daily. 12/15/15   Quintella Reichert, MD  methylPREDNISolone (MEDROL DOSEPAK) 4 MG TBPK tablet Take according to label instructions 01/28/17   Quintella Reichert, MD  metoprolol (TOPROL-XL) 50 MG 24 hr tablet Take 50 mg by mouth daily.      [provider]  metroNIDAZOLE (FLAGYL) 500 MG tablet Take 1 tablet (500 mg total) by mouth 2 (two) times daily. 11/15/16   Joy, Shawn C, PA-C  ondansetron (ZOFRAN-ODT) 8 MG disintegrating tablet Take 1 tablet (  8 mg total) by mouth every 8 (eight) hours as needed for nausea or vomiting. 11/15/16   Joy, Shawn C, PA-C  predniSONE (DELTASONE) 50 MG tablet Take 1 tablet (50 mg total) by mouth daily. 07/10/17   Laneshia Pina, Harrell Gave, PA-C  simvastatin (ZOCOR) 40 MG tablet Take 40 mg by mouth at bedtime.      [provider]  traMADol (ULTRAM) 50 MG tablet Take 1 tablet (50 mg total) by mouth every 6 (six) hours as needed for severe pain. 07/10/17   Dalia Heading, PA-C    Family History Family History  Problem Relation Age of Onset  . Diabetes Mother   . Hypertension Mother   . Diabetes Sister   . Hypertension Sister   . Hypertension Brother     Social History Social History   Tobacco Use  . Smoking status: Never Smoker  . Smokeless tobacco: Never Used  Substance Use Topics  . Alcohol use: No  . Drug use: No     Allergies   Codeine; Macrodantin  [nitrofurantoin]; and Septra [sulfamethoxazole w/trimethoprim (co-trimoxazole)]   Review of Systems Review of Systems All other systems negative except as documented in the HPI. All pertinent positives and negatives as reviewed in the HPI.  Physical Exam Updated Vital Signs BP 112/74   Pulse 84   Temp 98.2 F (36.8 C) (Oral)   Resp 18   Ht 5\' 3"  (1.6 m)   Wt 91.2 kg (201 lb)   LMP 06/25/2017   SpO2 99%   BMI 35.61 kg/m   Physical Exam  Constitutional: She is oriented to person, place, and time. She appears well-developed and well-nourished. No distress.  HENT:  Head: Normocephalic and atraumatic.  Eyes: Pupils are equal, round, and reactive to light.  Pulmonary/Chest: Effort normal.  Musculoskeletal:       Cervical back: She exhibits tenderness. She exhibits normal range of motion, no bony tenderness, no swelling and no edema.       Back:  Neurological: She is alert and oriented to person, place, and time. She displays normal reflexes. No sensory deficit. She exhibits normal muscle tone. Coordination normal.  Skin: Skin is warm and dry.  Psychiatric: She has a normal mood and affect.  Nursing note and vitals reviewed.    ED Treatments / Results  Labs (all labs ordered are listed, but only abnormal results are displayed) Labs Reviewed - No data to display  EKG None  Radiology No results found.  Procedures Procedures (including critical care time)  Medications Ordered in ED Medications  ketorolac (TORADOL) injection 60 mg (60 mg Intramuscular Given 07/10/17 1612)     Initial Impression / Assessment and Plan / ED Course  I have reviewed the triage vital signs and the nursing notes.  Pertinent labs & imaging results that were available during my care of the patient were reviewed by me and considered in my medical decision making (see chart for details).     Patient has normal reflexes and strength and sensation in her upper extremities.  Patient be treated  for cervical radiculopathy.  I have advised her to follow-up with her primary doctor patient is also given neurosurgery follow-up.  Patient is advised to use ice and heat on her neck.  Advised to return here for any worsening in her condition.  Final Clinical Impressions(s) / ED Diagnoses   Final diagnoses:  Cervical radicular pain  Bilateral sciatica    ED Discharge Orders        Ordered  predniSONE (DELTASONE) 50 MG tablet  Daily     07/10/17 1621    cyclobenzaprine (FLEXERIL) 10 MG tablet  Daily at bedtime     07/10/17 1621    traMADol (ULTRAM) 50 MG tablet  Every 6 hours PRN     07/10/17 1621       LawyerHarrell Gave, PA-C 07/14/17 [place], [place], DO 07/14/17 1551

## 2017-07-16 MED FILL — PANTOPRAZOLE SOD DR 20 MG T: 20 | 30 days supply | Qty: 30 | Fill #0

## 2017-07-28 MED FILL — CHLORTHALIDONE 25 MG TAB: 25 | 30 days supply | Qty: 30 | Fill #8

## 2017-07-28 MED FILL — SIMVASTATIN 40 MG TABLET: 40 | 30 days supply | Qty: 30 | Fill #2

## 2017-08-13 MED FILL — CITALOPRAM HBR 40 MG TABLET: 40 | 30 days supply | Qty: 30 | Fill #0

## 2017-08-13 MED FILL — PANTOPRAZOLE SOD DR 20 MG T: 20 | 30 days supply | Qty: 30 | Fill #1

## 2017-08-13 MED FILL — raNITIdine HCL 150 MG TABS: 150 | 30 days supply | Qty: 60 | Fill #1

## 2017-08-22 MED FILL — METOPROLOL SUCCINATE ER 100: 100 | 30 days supply | Qty: 30 | Fill #0

## 2017-08-28 MED FILL — SIMVASTATIN 40 MG TABLET: 40 | 30 days supply | Qty: 30 | Fill #0

## 2017-08-28 MED FILL — CHLORTHALIDONE 25 MG TAB: 25 | 30 days supply | Qty: 30 | Fill #9

## 2017-08-28 MED FILL — AMITRIPTYLINE HCL 25 MG TAB: 25 | 30 days supply | Qty: 30 | Fill #0

## 2017-09-12 MED FILL — PANTOPRAZOLE SOD DR 20 MG T: 20 | 30 days supply | Qty: 30 | Fill #0

## 2017-09-12 MED FILL — CITALOPRAM HBR 40 MG TABLET: 40 | 30 days supply | Qty: 30 | Fill #1

## 2017-09-29 MED FILL — CHLORTHALIDONE 25 MG TAB: 25 | 30 days supply | Qty: 30 | Fill #10

## 2017-09-29 MED FILL — SIMVASTATIN 40 MG TABLET: 40 | 30 days supply | Qty: 30 | Fill #1

## 2017-09-29 MED FILL — METOPROLOL SUCCINATE ER 100: 100 | 30 days supply | Qty: 30 | Fill #1

## 2017-09-29 MED FILL — AMITRIPTYLINE HCL 25 MG TAB: 25 | 30 days supply | Qty: 30 | Fill #1

## 2017-09-29 MED FILL — raNITIdine HCL 150 MG TABS: 150 | 30 days supply | Qty: 60 | Fill #2

## 2017-10-16 MED FILL — PANTOPRAZOLE SOD DR 20 MG T: 20 | 30 days supply | Qty: 30 | Fill #1

## 2017-10-16 MED FILL — CITALOPRAM HBR 40 MG TABLET: 40 | 30 days supply | Qty: 30 | Fill #2

## 2017-10-28 ENCOUNTER — Other Ambulatory Visit: Payer: Self-pay

## 2017-10-28 ENCOUNTER — Emergency Department (HOSPITAL_BASED_OUTPATIENT_CLINIC_OR_DEPARTMENT_OTHER)
Admission: EM | Admit: 2017-10-28 | Discharge: 2017-10-28 | Disposition: A | Discharge status: Home / Self Care | Payer: Self-pay | Attending: Emergency Medicine | Admitting: Emergency Medicine

## 2017-10-28 ENCOUNTER — Encounter (HOSPITAL_BASED_OUTPATIENT_CLINIC_OR_DEPARTMENT_OTHER): Payer: Self-pay

## 2017-10-28 DIAGNOSIS — N760 Acute vaginitis: Secondary | ICD-10-CM | POA: Insufficient documentation

## 2017-10-28 DIAGNOSIS — R1030 Lower abdominal pain, unspecified: Secondary | ICD-10-CM | POA: Insufficient documentation

## 2017-10-28 DIAGNOSIS — Z7982 Long term (current) use of aspirin: Secondary | ICD-10-CM | POA: Insufficient documentation

## 2017-10-28 DIAGNOSIS — B9689 Other specified bacterial agents as the cause of diseases classified elsewhere: Secondary | ICD-10-CM | POA: Insufficient documentation

## 2017-10-28 DIAGNOSIS — Z79899 Other long term (current) drug therapy: Secondary | ICD-10-CM | POA: Insufficient documentation

## 2017-10-28 DIAGNOSIS — J45909 Unspecified asthma, uncomplicated: Secondary | ICD-10-CM | POA: Insufficient documentation

## 2017-10-28 DIAGNOSIS — I10 Essential (primary) hypertension: Secondary | ICD-10-CM | POA: Insufficient documentation

## 2017-10-28 LAB — WET PREP, GENITAL
Sperm: NONE SEEN
Trich, Wet Prep: NONE SEEN
Yeast Wet Prep HPF POC: NONE SEEN

## 2017-10-28 LAB — PREGNANCY, URINE: PREG TEST UR: NEGATIVE

## 2017-10-28 LAB — URINALYSIS, ROUTINE W REFLEX MICROSCOPIC
Bilirubin Urine: NEGATIVE
Glucose, UA: NEGATIVE mg/dL
Hgb urine dipstick: NEGATIVE
Ketones, ur: NEGATIVE mg/dL
Leukocytes, UA: NEGATIVE
Nitrite: NEGATIVE
Protein, ur: NEGATIVE mg/dL
Specific Gravity, Urine: 1.02 (ref 1.005–1.030)
pH: 5.5 (ref 5.0–8.0)

## 2017-10-28 MED ORDER — METRONIDAZOLE 500 MG PO TABS: 500.0000 mg | ORAL_TABLET | Freq: Two times a day (BID) | ORAL | 0 refills | Status: DC

## 2017-10-28 MED FILL — metroNIDAZOLE 500 MG TABS: 500 | 7 days supply | Qty: 14 | Fill #0

## 2017-10-28 NOTE — ED Notes (Signed)
NAD at this time. Pt is stable and going home.  

## 2017-10-28 NOTE — ED Triage Notes (Signed)
C/o vaginal d/c x 4 days-NAD-steady gait

## 2017-10-28 NOTE — ED Provider Notes (Signed)
[facility] DEPARTMENT Provider Note   CSN: [id number] Arrival date & time: 10/28/17  1546     History   Chief Complaint Chief Complaint  Patient presents with  . Vaginal Discharge    HPI Katherine Wiggins is a 52 y.o. female.  Patient with history of endometriosis presents the emergency department with complaint of vaginal discharge ongoing over the past 1 to 2 weeks.  Patient has noted a thick white discharge with associated itching and odor.  She has had some lower abdominal soreness as well.  No fevers, nausea or vomiting.  She has a history of salpingectomy on the right.  She tried Monistat for her symptoms without improvement.  She denies any vaginal bleeding.  No fevers, nausea, vomiting, or diarrhea.  No dysuria or hematuria.  She is sexually active with one partner and is not concerned about having a sexual transmitted infection. The onset of this condition was acute. The course is constant. Aggravating factors: none. Alleviating factors: none.       Past Medical History:  Diagnosis Date  . Asthma   . Depression   . Endometriosis   . Fibromyalgia   . High cholesterol   . History of salpingectomy    Right  . Hypertension   . RA (rheumatoid arthritis) (HCC)     There are no active problems to display for this patient.   Past Surgical History:  Procedure Laterality Date  . CERVICAL SPINE SURGERY    . RIGHT OOPHORECTOMY Right 1995  . SALPINGECTOMY Right 1995  . TUBAL LIGATION       OB History    Gravida  5   Para  3   Term  3   Preterm      AB      Living  3     SAB      TAB      Ectopic      Multiple      Live Births               Home Medications    Prior to Admission medications   Medication Sig Start Date End Date Taking? Authorizing Provider  amitriptyline (ELAVIL) 25 MG tablet Take 25 mg by mouth at bedtime.    [provider]  aspirin 81 MG tablet Take 81 mg by mouth daily.    [provider]  cyclobenzaprine (FLEXERIL) 10 MG tablet Take 1 tablet (10 mg total) by mouth at bedtime. 07/10/17   Lawyer, Harrell Gave, PA-C  fexofenadine (ALLEGRA) 60 MG tablet Take 1 tablet (60 mg total) by mouth 2 (two) times daily. 05/30/13   Malvin Johns, MD  FLUoxetine (PROZAC) 20 MG tablet Take 20 mg by mouth daily.    [provider]  fluticasone (FLONASE) 50 MCG/ACT nasal spray Place 2 sprays into both nostrils daily. 07/30/16 08/13/16  Long, Wonda Olds, MD  ibuprofen (ADVIL,MOTRIN) 800 MG tablet Take 1 tablet (800 mg total) by mouth 3 (three) times daily. 12/15/15   Quintella Reichert, MD  methylPREDNISolone (MEDROL DOSEPAK) 4 MG TBPK tablet Take according to label instructions 01/28/17   Quintella Reichert, MD  metoprolol (TOPROL-XL) 50 MG 24 hr tablet Take 50 mg by mouth daily.      [provider]  metroNIDAZOLE (FLAGYL) 500 MG tablet Take 1 tablet (500 mg total) by mouth 2 (two) times daily. 11/15/16   Joy, Shawn C, PA-C  ondansetron (ZOFRAN-ODT) 8 MG disintegrating tablet Take 1 tablet (8 mg total) by  mouth every 8 (eight) hours as needed for nausea or vomiting. 11/15/16   Joy, Shawn C, PA-C  predniSONE (DELTASONE) 50 MG tablet Take 1 tablet (50 mg total) by mouth daily. 07/10/17   Lawyer, Harrell Gave, PA-C  simvastatin (ZOCOR) 40 MG tablet Take 40 mg by mouth at bedtime.      [provider]  traMADol (ULTRAM) 50 MG tablet Take 1 tablet (50 mg total) by mouth every 6 (six) hours as needed for severe pain. 07/10/17   Dalia Heading, PA-C    Family History Family History  Problem Relation Age of Onset  . Diabetes Mother   . Hypertension Mother   . Diabetes Sister   . Hypertension Sister   . Hypertension Brother     Social History Social History   Tobacco Use  . Smoking status: Never Smoker  . Smokeless tobacco: Never Used  Substance Use Topics  . Alcohol use: No  . Drug use: No     Allergies   Codeine; Macrodantin [nitrofurantoin]; and Septra  [sulfamethoxazole w/trimethoprim (co-trimoxazole)]   Review of Systems Review of Systems  Constitutional: Negative for fever.  HENT: Negative for rhinorrhea and sore throat.   Eyes: Negative for redness.  Respiratory: Negative for cough.   Cardiovascular: Negative for chest pain.  Gastrointestinal: Negative for abdominal pain, diarrhea, nausea and vomiting.  Genitourinary: Positive for pelvic pain and vaginal discharge. Negative for dysuria, frequency, hematuria and vaginal bleeding.  Musculoskeletal: Negative for myalgias.  Skin: Negative for rash.  Neurological: Negative for headaches.     Physical Exam Updated Vital Signs BP 123/62 (BP Location: Left Arm)   Pulse 81   Temp 98.5 F (36.9 C) (Oral)   Resp 16   Ht 5\' 4"  (1.626 m)   Wt 89.9 kg (198 lb 1.6 oz)   LMP 09/22/2017   SpO2 100%   BMI 34.00 kg/m   Physical Exam  Constitutional: She appears well-developed and well-nourished.  HENT:  Head: Normocephalic and atraumatic.  Eyes: Conjunctivae are normal. Right eye exhibits no discharge. Left eye exhibits no discharge.  Neck: Normal range of motion. Neck supple.  Cardiovascular: Normal rate, regular rhythm and normal heart sounds.  Pulmonary/Chest: Effort normal and breath sounds normal.  Abdominal: Soft. There is no tenderness.  Genitourinary: Uterus normal. Pelvic exam was performed with patient supine. There is no rash or tenderness on the right labia. There is no rash or tenderness on the left labia. Cervix exhibits no motion tenderness and no discharge. Right adnexum displays no mass and no tenderness. Left adnexum displays no mass and no tenderness. There is tenderness (mild) in the vagina. No signs of injury around the vagina. Vaginal discharge found.  Neurological: She is alert.  Skin: Skin is warm and dry.  Psychiatric: She has a normal mood and affect.  Nursing note and vitals reviewed.    ED Treatments / Results  Labs (all labs ordered are listed, but  only abnormal results are displayed) Labs Reviewed  WET PREP, GENITAL  PREGNANCY, URINE  URINALYSIS, ROUTINE W REFLEX MICROSCOPIC  GC/CHLAMYDIA PROBE AMP ([place]) NOT AT [facility]    EKG None  Radiology No results found.  Procedures Procedures (including critical care time)  Medications Ordered in ED Medications - No data to display   Initial Impression / Assessment and Plan / ED Course  I have reviewed the triage vital signs and the nursing notes.  Pertinent labs & imaging results that were available during my care of the patient were reviewed by  me and considered in my medical decision making (see chart for details).     Patient seen and examined. Work-up initiated. Pelvic exam with RN chaperone.    Vital signs reviewed and are as follows: BP 123/62 (BP Location: Left Arm)   Pulse 81   Temp 98.5 F (36.9 C) (Oral)   Resp 16   Ht 5\' 4"  (1.626 m)   Wt 89.9 kg (198 lb 1.6 oz)   LMP 09/22/2017   SpO2 100%   BMI 34.00 kg/m   5:17 PM wet prep with clue cells.  Given troublesome discharge, consistent with bacterial vaginosis, feel treatment is appropriate.  Patient will be discharged.  Encourage PCP/GYN follow-up with continued symptoms.  Final Clinical Impressions(s) / ED Diagnoses   Final diagnoses:  BV (bacterial vaginosis)   Patient with vaginal discharge, minimal lower abdominal tenderness.  Exam and wet prep consistent with BV.  No significant tenderness on abdominal exam.  No other systemic symptoms of illness.  Do not feel that further work-up with lab work or imaging is indicated at this time.  No concern for ovarian torsion or tubo-ovarian abscess.  No UTI or pregnancy.  ED Discharge Orders        Ordered    metroNIDAZOLE (FLAGYL) 500 MG tablet  2 times daily     10/28/17 1714       Carlisle Cater, PA-C 10/28/17 1718    Drenda Freeze, MD 10/28/17 [phone]

## 2017-10-28 NOTE — Discharge Instructions (Signed)
Please read and follow all provided instructions.  Your diagnoses today include:  1. BV (bacterial vaginosis)     Tests performed today include:  Wet prep - has signs of bacterial vaginosis  Urine test to look for infection and pregnancy (in women)  Vital signs. See below for your results today.   Medications prescribed:   Metronidazole - antibiotic  You have been prescribed an antibiotic medicine: take the entire course of medicine even if you are feeling better. Stopping early can cause the antibiotic not to work. Do not drink alcohol when taking this medication.   Take any prescribed medications only as directed.  Home care instructions:   Follow any educational materials contained in this packet.  Follow-up instructions: Please follow-up with your primary care provider in the next 3 days for further evaluation of your symptoms.    Return instructions:  SEEK IMMEDIATE MEDICAL ATTENTION IF:  The pain does not go away or becomes severe   A temperature above 101F develops   Repeated vomiting occurs (multiple episodes)   The pain becomes localized to portions of the abdomen. The right side could possibly be appendicitis. In an adult, the left lower portion of the abdomen could be colitis or diverticulitis.   Blood is being passed in stools or vomit (bright red or black tarry stools)   You develop chest pain, difficulty breathing, dizziness or fainting, or become confused, poorly responsive, or inconsolable (young children)  If you have any other emergent concerns regarding your health  Your vital signs today were: BP 123/62 (BP Location: Left Arm)    Pulse 81    Temp 98.5 F (36.9 C) (Oral)    Resp 16    Ht 5\' 4"  (1.626 m)    Wt 89.9 kg (198 lb 1.6 oz)    LMP 09/22/2017    SpO2 100%    BMI 34.00 kg/m  If your blood pressure (bp) was elevated above 135/85 this visit, please have this repeated by your doctor within one month. --------------

## 2017-10-29 LAB — GC/CHLAMYDIA PROBE AMP (~~LOC~~) NOT AT ARMC
CHLAMYDIA, DNA PROBE: NEGATIVE
NEISSERIA GONORRHEA: NEGATIVE

## 2017-10-31 MED FILL — SIMVASTATIN 40 MG TABLET: 40 | 30 days supply | Qty: 30 | Fill #2

## 2017-10-31 MED FILL — CHLORTHALIDONE 25 MG TABS: 25 | 30 days supply | Qty: 30 | Fill #11

## 2017-11-18 MED FILL — CITALOPRAM HBR 40 MG TABLET: 40 | 30 days supply | Qty: 30 | Fill #0

## 2017-11-18 MED FILL — PANTOPRAZOLE SOD DR 20 MG T: 20 | 30 days supply | Qty: 30 | Fill #0

## 2017-11-26 MED FILL — HYDROCHLOROTHIAZIDE 25 MG T: 25 | 30 days supply | Qty: 30 | Fill #0

## 2017-11-28 MED FILL — SIMVASTATIN 40 MG TABLET: 40 | 30 days supply | Qty: 30 | Fill #0

## 2017-12-02 ENCOUNTER — Emergency Department (HOSPITAL_BASED_OUTPATIENT_CLINIC_OR_DEPARTMENT_OTHER)
Admission: EM | Admit: 2017-12-02 | Discharge: 2017-12-02 | Disposition: A | Discharge status: Home / Self Care | Payer: Self-pay | Attending: Emergency Medicine | Admitting: Emergency Medicine

## 2017-12-02 ENCOUNTER — Other Ambulatory Visit: Payer: Self-pay

## 2017-12-02 ENCOUNTER — Encounter (HOSPITAL_BASED_OUTPATIENT_CLINIC_OR_DEPARTMENT_OTHER): Payer: Self-pay | Admitting: Student

## 2017-12-02 DIAGNOSIS — Z79899 Other long term (current) drug therapy: Secondary | ICD-10-CM | POA: Insufficient documentation

## 2017-12-02 DIAGNOSIS — J0100 Acute maxillary sinusitis, unspecified: Secondary | ICD-10-CM | POA: Insufficient documentation

## 2017-12-02 DIAGNOSIS — I10 Essential (primary) hypertension: Secondary | ICD-10-CM | POA: Insufficient documentation

## 2017-12-02 DIAGNOSIS — Z7982 Long term (current) use of aspirin: Secondary | ICD-10-CM | POA: Insufficient documentation

## 2017-12-02 DIAGNOSIS — J45909 Unspecified asthma, uncomplicated: Secondary | ICD-10-CM | POA: Insufficient documentation

## 2017-12-02 LAB — GROUP A STREP BY PCR: Group A Strep by PCR: NOT DETECTED

## 2017-12-02 MED ORDER — FLUTICASONE PROPIONATE 50 MCG/ACT NA SUSP: 1.0000 | Freq: Every day | NASAL | 0 refills | Status: DC

## 2017-12-02 MED ORDER — AMOXICILLIN-POT CLAVULANATE 875-125 MG PO TABS: 1.0000 | ORAL_TABLET | Freq: Two times a day (BID) | ORAL | 0 refills | Status: DC

## 2017-12-02 MED FILL — AMOX-CLAV 875-125 MG TABLET: 875-125 | 7 days supply | Qty: 14 | Fill #0

## 2017-12-02 NOTE — ED Notes (Signed)
ED Provider at bedside. 

## 2017-12-02 NOTE — ED Provider Notes (Signed)
[facility] EMERGENCY DEPARTMENT Provider Note   CSN: [id number] Arrival date & time: 12/02/17  1041     History   Chief Complaint Chief Complaint  Patient presents with  . Sore Throat    HPI Katherine Wiggins is a 52 y.o. female with a hx of asthma, depression, fibromyalgia, hypercholesterolemia, HTN, and rheumatoid arthritis who presents to the ED with complaints of URI sxs x 2 weeks. Patient reports congestion, rhinorrhea, sinus pain/pressure, ear pain (R>L), sore throat, feeling of swollen lymph nodes in the neck, and minimal dry cough. Sxs are constant. No specific alleviating/aggravating factors other than pain worsening with swallowing, but she is able to swallow. Tried tylenol sinus/cold/flu OTC without relief. 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 Denies fever, chills, change in voice, dyspnea, wheezing, or drooling.    HPI  Past Medical History:  Diagnosis Date  . Asthma   . Depression   . Endometriosis   . Fibromyalgia   . High cholesterol   . History of salpingectomy    Right  . Hypertension   . RA (rheumatoid arthritis) (HCC)     There are no active problems to display for this patient.   Past Surgical History:  Procedure Laterality Date  . CERVICAL SPINE SURGERY    . RIGHT OOPHORECTOMY Right 1995  . SALPINGECTOMY Right 1995  . TUBAL LIGATION       OB History    Gravida  5   Para  3   Term  3   Preterm      AB      Living  3     SAB      TAB      Ectopic      Multiple      Live Births               Home Medications    Prior to Admission medications   Medication Sig Start Date End Date Taking? Authorizing Provider  amitriptyline (ELAVIL) 25 MG tablet Take 25 mg by mouth at bedtime.    [provider]  aspirin 81 MG tablet Take 81 mg by mouth daily.    [provider]  cyclobenzaprine (FLEXERIL) 10 MG tablet Take 1 tablet (10 mg total) by mouth at bedtime. 07/10/17   Lawyer,  Harrell Gave, PA-C  fexofenadine (ALLEGRA) 60 MG tablet Take 1 tablet (60 mg total) by mouth 2 (two) times daily. 05/30/13   Malvin Johns, MD  FLUoxetine (PROZAC) 20 MG tablet Take 20 mg by mouth daily.    [provider]  fluticasone (FLONASE) 50 MCG/ACT nasal spray Place 2 sprays into both nostrils daily. 07/30/16 08/13/16  Long, Wonda Olds, MD  ibuprofen (ADVIL,MOTRIN) 800 MG tablet Take 1 tablet (800 mg total) by mouth 3 (three) times daily. 12/15/15   Quintella Reichert, MD  methylPREDNISolone (MEDROL DOSEPAK) 4 MG TBPK tablet Take according to label instructions 01/28/17   Quintella Reichert, MD  metoprolol (TOPROL-XL) 50 MG 24 hr tablet Take 50 mg by mouth daily.      [provider]  metroNIDAZOLE (FLAGYL) 500 MG tablet Take 1 tablet (500 mg total) by mouth 2 (two) times daily. 10/28/17   Carlisle Cater, PA-C  ondansetron (ZOFRAN-ODT) 8 MG disintegrating tablet Take 1 tablet (8 mg total) by mouth every 8 (eight) hours as needed for nausea or vomiting. 11/15/16   Joy, Shawn C, PA-C  predniSONE (DELTASONE) 50 MG tablet Take 1 tablet (50 mg total) by mouth daily.  07/10/17   Lawyer, Harrell Gave, PA-C  simvastatin (ZOCOR) 40 MG tablet Take 40 mg by mouth at bedtime.      [provider]  traMADol (ULTRAM) 50 MG tablet Take 1 tablet (50 mg total) by mouth every 6 (six) hours as needed for severe pain. 07/10/17   Dalia Heading, PA-C    Family History Family History  Problem Relation Age of Onset  . Diabetes Mother   . Hypertension Mother   . Diabetes Sister   . Hypertension Sister   . Hypertension Brother     Social History Social History   Tobacco Use  . Smoking status: Never Smoker  . Smokeless tobacco: Never Used  Substance Use Topics  . Alcohol use: No  . Drug use: No     Allergies   Codeine; Macrodantin [nitrofurantoin]; and Septra [sulfamethoxazole w/trimethoprim (co-trimoxazole)]   Review of Systems Review of Systems  Constitutional: Negative for chills  and fever.  HENT: Positive for congestion, ear pain, rhinorrhea, sinus pressure, sinus pain, sore throat and trouble swallowing (painful, but able). Negative for drooling, ear discharge and voice change.   Respiratory: Positive for cough. Negative for shortness of breath, wheezing and stridor.   Cardiovascular: Negative for chest pain.  Musculoskeletal: Negative for neck stiffness.     Physical Exam Updated Vital Signs BP 122/68 (BP Location: Right Arm)   Pulse 62   Temp 98.2 F (36.8 C) (Oral)   Resp 18   Ht 5\' 4"  (1.626 m)   Wt 87.5 kg   LMP 11/06/2017   SpO2 99%   BMI 33.13 kg/m   Physical Exam  Constitutional: She appears well-developed and well-nourished.  Non-toxic appearance. No distress.  HENT:  Head: Normocephalic and atraumatic.  Right Ear: Ear canal normal. Tympanic membrane is not perforated, not erythematous, not retracted and not bulging.  Left Ear: Ear canal normal. Tympanic membrane is not perforated, not erythematous, not retracted and not bulging.  Nose: Mucosal edema present. Right sinus exhibits maxillary sinus tenderness and frontal sinus tenderness. Left sinus exhibits maxillary sinus tenderness and frontal sinus tenderness.  Mouth/Throat: Uvula is midline. Posterior oropharyngeal erythema present. No oropharyngeal exudate or posterior oropharyngeal edema.  TMs appear a bit full bilaterally. No loss of landmarks. No erythema.   Posterior oropharynx is symmetric appearing. Patient tolerating own secretions without difficulty. No trismus. No drooling. No hot potato voice. No swelling beneath the tongue, submandibular compartment is soft.    Eyes: Pupils are equal, round, and reactive to light. Conjunctivae are normal. Right eye exhibits no discharge. Left eye exhibits no discharge.  Neck: Normal range of motion. Neck supple. No neck rigidity. No edema and no erythema present.  Cardiovascular: Normal rate and regular rhythm.  No murmur heard. Pulmonary/Chest:  Effort normal and breath sounds normal. No respiratory distress. She has no wheezes. She has no rhonchi. She has no rales.  Respirations even and unlabored.   Abdominal: Soft. She exhibits no distension. There is no tenderness.  Lymphadenopathy:    She has cervical adenopathy (bilateral anterior).  Neurological: She is alert.  Skin: Skin is warm and dry. No rash noted.  Psychiatric: She has a normal mood and affect. Her behavior is normal.  Nursing note and vitals reviewed.    ED Treatments / Results  Labs (all labs ordered are listed, but only abnormal results are displayed) Labs Reviewed - No data to display  EKG None  Radiology No results found.  Procedures Procedures (including critical care time)  Medications Ordered in  ED Medications - No data to display   Initial Impression / Assessment and Plan / ED Course  I have reviewed the triage vital signs and the nursing notes.  Pertinent labs & imaging results that were available during my care of the patient were reviewed by me and considered in my medical decision making (see chart for details).    Patient presents with URI type symptoms.  Patient is nontoxic appearing, in no apparent distress, vitals are WNL. Patient is afebrile in the ED, lungs are CTA, doubt pneumonia. There is no wheezing or signs of respiratory distress. Strep test is negative, no evidence of RPA/PTA on exam. No evidence of AOM on exam. No meningeal signs. No history components or rashes to raise concern for tic borne illness. Given patient has has sxs > 10 days with sinus tenderness on exam, worsening as opposed to improving, feel that treatment for sinusitis is reasonable. Prescriptions for Augmentin and for Flonase.  I discussed results, treatment plan, need for PCP follow-up, and return precautions with the patient. Provided opportunity for questions, patient confirmed understanding and is in agreement with plan.    Final Clinical Impressions(s) / ED  Diagnoses   Final diagnoses:  Acute non-recurrent maxillary sinusitis    ED Discharge Orders         Ordered    amoxicillin-clavulanate (AUGMENTIN) 875-125 MG tablet  Every 12 hours     12/02/17 1206    fluticasone (FLONASE) 50 MCG/ACT nasal spray  Daily     12/02/17 [address] PA-C 12/02/17 1220    Mesner, Corene Cornea, MD 12/02/17 1328

## 2017-12-02 NOTE — ED Triage Notes (Signed)
Sore Throat for 2 weeks, took Tylenol with allergy with no relief.  Feeling full  to both ears both more on the right.  Sinus areas feels sore and tender.

## 2017-12-02 NOTE — Discharge Instructions (Signed)
You were seen in the ER for upper respiratory infection type symptoms. Your strep test was negative. Given you have had symptoms for 2 weeks we feel it is reasonable to treat you for sinusitis- an infection of the sinuses. We are placing you on augmentin, an antibiotic to treat this. We are also giving you flonase, a nasal spray steroid to help with the congestion.   We have prescribed you new medication(s) today. Discuss the medications prescribed today with your pharmacist as they can have adverse effects and interactions with your other medicines including over the counter and prescribed medications. Seek medical evaluation if you start to experience new or abnormal symptoms after taking one of these medicines, seek care immediately if you start to experience difficulty breathing, feeling of your throat closing, facial swelling, or rash as these could be indications of a more serious allergic reaction   Please take ibuprofen per over the counter dosing to assist with discomfort overall.  Please follow up with your primary care provider within 1 week for re-evaluation. If you do not have a primary care provider please call the phone number circled in your discharge instructions. Return to the ER for new or worsening symptoms or any other concerns that you may have.

## 2017-12-15 MED FILL — raNITIdine HCL 150 MG TABS: 150 | 30 days supply | Qty: 60 | Fill #3

## 2017-12-17 MED FILL — CITALOPRAM HBR 40 MG TABLET: 40 | 30 days supply | Qty: 30 | Fill #1

## 2017-12-17 MED FILL — PANTOPRAZOLE SOD DR 20 MG T: 20 | 30 days supply | Qty: 30 | Fill #1

## 2017-12-17 MED FILL — POTASSIUM CL ER 20 MEQ TAB: 20 | 30 days supply | Qty: 30 | Fill #1

## 2017-12-29 MED FILL — SIMVASTATIN 40 MG TABLET: 40 | 30 days supply | Qty: 30 | Fill #1

## 2017-12-29 MED FILL — HYDROCHLOROTHIAZIDE 25 MG T: 25 | 30 days supply | Qty: 30 | Fill #1

## 2018-01-19 MED FILL — PANTOPRAZOLE SOD DR 20 MG T: 20 | 30 days supply | Qty: 30 | Fill #2

## 2018-01-19 MED FILL — CITALOPRAM HBR 40 MG TABLET: 40 | 30 days supply | Qty: 30 | Fill #2

## 2018-01-23 MED FILL — METOPROLOL SUCCINATE ER 100: 100 | 30 days supply | Qty: 30 | Fill #2

## 2018-01-26 MED FILL — POTASSIUM CL ER 20 MEQ TAB: 20 | 30 days supply | Qty: 30 | Fill #2

## 2018-02-01 ENCOUNTER — Other Ambulatory Visit: Payer: Self-pay

## 2018-02-01 ENCOUNTER — Emergency Department (HOSPITAL_BASED_OUTPATIENT_CLINIC_OR_DEPARTMENT_OTHER)
Admission: EM | Admit: 2018-02-01 | Discharge: 2018-02-01 | Disposition: A | Discharge status: Home / Self Care | Payer: Self-pay | Attending: Emergency Medicine | Admitting: Emergency Medicine

## 2018-02-01 ENCOUNTER — Encounter (HOSPITAL_BASED_OUTPATIENT_CLINIC_OR_DEPARTMENT_OTHER): Payer: Self-pay | Admitting: Emergency Medicine

## 2018-02-01 DIAGNOSIS — R2231 Localized swelling, mass and lump, right upper limb: Secondary | ICD-10-CM | POA: Insufficient documentation

## 2018-02-01 DIAGNOSIS — J45909 Unspecified asthma, uncomplicated: Secondary | ICD-10-CM | POA: Insufficient documentation

## 2018-02-01 DIAGNOSIS — Z79899 Other long term (current) drug therapy: Secondary | ICD-10-CM | POA: Insufficient documentation

## 2018-02-01 DIAGNOSIS — I10 Essential (primary) hypertension: Secondary | ICD-10-CM | POA: Insufficient documentation

## 2018-02-01 NOTE — Discharge Instructions (Addendum)
If these areas become more painful, more swollen or grow rapidly, turns red or start draining, you have fever, or any other new/concerning symptoms and return to the ER for evaluation.

## 2018-02-01 NOTE — ED Triage Notes (Signed)
Reports cyst to right thumb which began 2 weeks ago

## 2018-02-01 NOTE — ED Notes (Signed)
Pt verbalized understanding of dc instructions.

## 2018-02-01 NOTE — ED Provider Notes (Signed)
[facility] EMERGENCY DEPARTMENT Provider Note   CSN: [id number] Arrival date & time: 02/01/18  1101     History   Chief Complaint Chief Complaint  Patient presents with  . Cyst    HPI Katherine Wiggins is a 52 y.o. female.  HPI  52 year old female presents with a painful subcutaneous spot underneath her right thumb.  She has also noticed a spot on her right middle finger.  Thumb started about a week or so ago and is becoming a little more swollen and painful.  Middle finger is not as painful and smaller.  There has been no redness or drainage.  No fevers.  She is taken ibuprofen.  Because it started to hurt she went to get it checked out. No injury or wound.  Past Medical History:  Diagnosis Date  . Asthma   . Depression   . Endometriosis   . Fibromyalgia   . High cholesterol   . History of salpingectomy    Right  . Hypertension   . RA (rheumatoid arthritis) (HCC)     There are no active problems to display for this patient.   Past Surgical History:  Procedure Laterality Date  . CERVICAL SPINE SURGERY    . RIGHT OOPHORECTOMY Right 1995  . SALPINGECTOMY Right 1995  . TUBAL LIGATION       OB History    Gravida  5   Para  3   Term  3   Preterm      AB      Living  3     SAB      TAB      Ectopic      Multiple      Live Births               Home Medications    Prior to Admission medications   Medication Sig Start Date End Date Taking? Authorizing Provider  amitriptyline (ELAVIL) 25 MG tablet Take 25 mg by mouth at bedtime.    [provider]  amoxicillin-clavulanate (AUGMENTIN) 875-125 MG tablet Take 1 tablet by mouth every 12 (twelve) hours. 12/02/17   Petrucelli, Glynda Jaeger, PA-C  aspirin 81 MG tablet Take 81 mg by mouth daily.    [provider]  citalopram (CELEXA) 40 MG tablet Take 40 mg by mouth daily.    [provider]  fluticasone (FLONASE) 50 MCG/ACT nasal spray Place 1 spray into both  nostrils daily. 12/02/17   Petrucelli, Samantha R, PA-C  ibuprofen (ADVIL,MOTRIN) 800 MG tablet Take 1 tablet (800 mg total) by mouth 3 (three) times daily. 12/15/15   Quintella Reichert, MD  metoprolol tartrate (LOPRESSOR) 100 MG tablet Take 100 mg by mouth 2 (two) times daily.    [provider]  pantoprazole (PROTONIX) 20 MG tablet Take 20 mg by mouth daily.    [provider]  ranitidine (ZANTAC) 150 MG capsule Take 150 mg by mouth every evening.    [provider]  simvastatin (ZOCOR) 40 MG tablet Take 40 mg by mouth at bedtime.      [provider]    Family History Family History  Problem Relation Age of Onset  . Diabetes Mother   . Hypertension Mother   . Diabetes Sister   . Hypertension Sister   . Hypertension Brother     Social History Social History   Tobacco Use  . Smoking status: Never Smoker  . Smokeless tobacco: Never Used  Substance Use Topics  .  Alcohol use: No  . Drug use: No     Allergies   Codeine; Macrodantin [nitrofurantoin]; and Septra [sulfamethoxazole w/trimethoprim (co-trimoxazole)]   Review of Systems Review of Systems  Constitutional: Negative for fever.  Musculoskeletal: Positive for arthralgias.  Skin: Negative for color change and wound.     Physical Exam Updated Vital Signs BP 130/83   Pulse 81   Temp 98 F (36.7 C) (Oral)   Resp 18   Ht 5\' 3"  (1.6 m)   Wt 88.9 kg   LMP 01/25/2018 (Approximate)   SpO2 98%   BMI 34.72 kg/m   Physical Exam  Constitutional: She appears well-developed and well-nourished.  HENT:  Head: Normocephalic and atraumatic.  Right Ear: External ear normal.  Left Ear: External ear normal.  Nose: Nose normal.  Eyes: Right eye exhibits no discharge. Left eye exhibits no discharge.  Pulmonary/Chest: Effort normal.  Musculoskeletal:       Right hand: She exhibits tenderness and swelling.       Hands: Normal ROM of hand  Neurological: She is alert.  Skin: Skin is warm and  dry. No erythema.  Psychiatric: Her mood appears not anxious.  Nursing note and vitals reviewed.    ED Treatments / Results  Labs (all labs ordered are listed, but only abnormal results are displayed) Labs Reviewed - No data to display  EKG None  Radiology No results found.  Procedures Procedures (including critical care time)  Medications Ordered in ED Medications - No data to display   Initial Impression / Assessment and Plan / ED Course  I have reviewed the triage vital signs and the nursing notes.  Pertinent labs & imaging results that were available during my care of the patient were reviewed by me and considered in my medical decision making (see chart for details).     The skin over these nodules is a little white but there is no other skin changes such as a wart would typically represent.  It is not fluctuant like a typical abscess.  I did put an ultrasound on it which does show some hypoechoic circular area beneath the skin but there is also some hyperechoic areas in between.  I do not think draining or incising this area is needed at this time.  These are probably more nodules than areas of pus.  I discussed she could follow-up with a hand specialist for further evaluation as we see where he goes from here.  If these are rapidly growing or developing infectious signs/symptoms she should return.  Otherwise appears stable for discharge home.  Final Clinical Impressions(s) / ED Diagnoses   Final diagnoses:  Nodule of finger of right hand    ED Discharge Orders    None       Sherwood Gambler, MD 02/01/18 1126

## 2018-02-02 MED FILL — HYDROCHLOROTHIAZIDE 25 MG T: 25 | 30 days supply | Qty: 30 | Fill #2

## 2018-02-02 MED FILL — SIMVASTATIN 40 MG TABLET: 40 | 30 days supply | Qty: 30 | Fill #2

## 2018-02-16 MED FILL — CLINDAMYCIN HCL 300 MG CAP: 300 | 7 days supply | Qty: 21 | Fill #0

## 2018-02-23 MED FILL — PANTOPRAZOLE SOD DR 20 MG T: 20 | 30 days supply | Qty: 30 | Fill #3

## 2018-02-23 MED FILL — METOPROLOL SUCCINATE ER 100: 100 | 30 days supply | Qty: 30 | Fill #3

## 2018-02-23 MED FILL — CITALOPRAM HBR 40 MG TABLET: 40 | 30 days supply | Qty: 30 | Fill #3

## 2018-02-23 MED FILL — POTASSIUM CL ER 20 MEQ TAB: 20 | 30 days supply | Qty: 30 | Fill #3

## 2018-03-10 MED FILL — HYDROCHLOROTHIAZIDE 25 MG T: 25 | 30 days supply | Qty: 30 | Fill #0

## 2018-03-12 MED FILL — SIMVASTATIN 40 MG TABLET: 40 | 30 days supply | Qty: 30 | Fill #3

## 2018-03-23 MED FILL — PANTOPRAZOLE SOD DR 20 MG T: 20 | 30 days supply | Qty: 30 | Fill #4

## 2018-03-23 MED FILL — CITALOPRAM HBR 40 MG TABLET: 40 | 30 days supply | Qty: 30 | Fill #4

## 2018-03-27 MED FILL — METOPROLOL SUCCINATE ER 100: 100 | 30 days supply | Qty: 30 | Fill #4

## 2018-04-03 ENCOUNTER — Encounter: Payer: Self-pay | Admitting: Obstetrics & Gynecology

## 2018-04-03 ENCOUNTER — Ambulatory Visit (INDEPENDENT_AMBULATORY_CARE_PROVIDER_SITE_OTHER): Payer: No Typology Code available for payment source | Admitting: Obstetrics & Gynecology

## 2018-04-03 VITALS — BP 106/68 | HR 65 | Ht 63.0 in | Wt 191.1 lb

## 2018-04-03 DIAGNOSIS — R102 Pelvic and perineal pain: Secondary | ICD-10-CM | POA: Diagnosis not present

## 2018-04-03 DIAGNOSIS — Z1151 Encounter for screening for human papillomavirus (HPV): Secondary | ICD-10-CM

## 2018-04-03 DIAGNOSIS — Z124 Encounter for screening for malignant neoplasm of cervix: Secondary | ICD-10-CM | POA: Diagnosis not present

## 2018-04-03 DIAGNOSIS — Z113 Encounter for screening for infections with a predominantly sexual mode of transmission: Secondary | ICD-10-CM | POA: Diagnosis not present

## 2018-04-03 DIAGNOSIS — Z01419 Encounter for gynecological examination (general) (routine) without abnormal findings: Secondary | ICD-10-CM

## 2018-04-03 DIAGNOSIS — Z1239 Encounter for other screening for malignant neoplasm of breast: Secondary | ICD-10-CM | POA: Diagnosis not present

## 2018-04-03 NOTE — Progress Notes (Signed)
Subjective:     Katherine Wiggins is a 53 y.o. female here for a routine exam.  [id number] LMP 03/28/2017 pt still has monthly cycles. The cycles are changing they alternate heavy and light. Current complaints: chronic pelvic pain x 5 years.  Pt is not sexually active due to pain. She reports deep pain that persists after intercourse. The pain will take 2-3 days to resolve.   Has laparoscopically diagnosed endometriosos       Gynecologic History Patient's last menstrual period was 03/26/2018. Contraception: none Last Pap: 10/10/2011. Results were: normal Last mammogram: may have in 2005.   Obstetric History OB History  Gravida Para Term Preterm AB Living  5 3 2 1 2 3   SAB TAB Ectopic Multiple Live Births  2       3    # Outcome Date GA Lbr Len/2nd Weight Sex Delivery Anes PTL Lv  5 Term 1994 [redacted]w[redacted]d   M CS-LTranv EPI  LIV  4 Preterm 72 [redacted]w[redacted]d   M Vag-Spont None  LIV  3 Term 1991 [redacted]w[redacted]d   M Vag-Spont EPI  LIV  2 SAB 01/1987          1 SAB 07/1986           The following portions of the patient's history were reviewed and updated as appropriate: allergies, current medications, past family history, past medical history, past social history, past surgical history and problem list.  Review of Systems Pertinent items are noted in HPI.    Objective:  BP 106/68   Pulse 65   Ht 5\' 3"  (1.6 m)   Wt 191 lb 1.3 oz (86.7 kg)   LMP 03/26/2018   BMI 33.85 kg/m   General Appearance:    Alert, cooperative, no distress, appears stated age  Head:    Normocephalic, without obvious abnormality, atraumatic  Eyes:    conjunctiva/corneas clear, EOM's intact, both eyes  Ears:    Normal external ear canals, both ears  Nose:   Nares normal, septum midline, mucosa normal, no drainage    or sinus tenderness  Throat:   Lips, mucosa, and tongue normal; teeth and gums normal  Neck:   Supple, symmetrical, trachea midline, no adenopathy;    thyroid:  no enlargement/tenderness/nodules  Back:     Symmetric, no  curvature, ROM normal, no CVA tenderness  Lungs:     Clear to auscultation bilaterally, respirations unlabored  Chest Wall:    No tenderness or deformity   Heart:    Regular rate and rhythm, S1 and S2 normal, no murmur, rub   or gallop  Breast Exam:    No tenderness, masses, or nipple abnormality  Abdomen:     Soft, non-tender, bowel sounds active all four quadrants,    no masses, no organomegaly  Genitalia:    Normal female without lesion or discharge. Tenderness to adnexa on the left side with fullness. Min CMT        Extremities:   Extremities normal, atraumatic, no cyanosis or edema  Pulses:   2+ and symmetric all extremities  Skin:   Skin color, texture, turgor normal, no rashes or lesions     Assessment:    Healthy female exam.   STI screen  Breast cancer screening AUB dyspareunia - if no endometrioma will discuss with pt LnIUD    Plan:    Mammogram ordered. Follow up in: 1 year.  Annual  F/u in 2 weeks for review of results Pelvic US   Katherine Wiggins,  M.D., Cherlynn June

## 2018-04-04 LAB — HEPATITIS B SURFACE ANTIGEN: HEP B S AG: NEGATIVE

## 2018-04-04 LAB — HIV ANTIBODY (ROUTINE TESTING W REFLEX): HIV SCREEN 4TH GENERATION: NONREACTIVE

## 2018-04-04 LAB — RPR: RPR Ser Ql: NONREACTIVE

## 2018-04-04 LAB — HEPATITIS C ANTIBODY

## 2018-04-06 LAB — CYTOLOGY - PAP
CHLAMYDIA, DNA PROBE: NEGATIVE
Diagnosis: NEGATIVE
HPV (WINDOPATH): NOT DETECTED
Neisseria Gonorrhea: NEGATIVE

## 2018-04-07 ENCOUNTER — Encounter (HOSPITAL_BASED_OUTPATIENT_CLINIC_OR_DEPARTMENT_OTHER): Payer: Self-pay

## 2018-04-07 ENCOUNTER — Ambulatory Visit (HOSPITAL_BASED_OUTPATIENT_CLINIC_OR_DEPARTMENT_OTHER)
Admission: RE | Admit: 2018-04-07 | Discharge: 2018-04-07 | Disposition: A | Discharge status: Home / Self Care | Payer: PRIVATE HEALTH INSURANCE | Source: Ambulatory Visit | Attending: Obstetrics & Gynecology | Admitting: Obstetrics & Gynecology

## 2018-04-07 ENCOUNTER — Ambulatory Visit (HOSPITAL_BASED_OUTPATIENT_CLINIC_OR_DEPARTMENT_OTHER): Payer: PRIVATE HEALTH INSURANCE

## 2018-04-07 DIAGNOSIS — R102 Pelvic and perineal pain: Secondary | ICD-10-CM

## 2018-04-07 DIAGNOSIS — Z1239 Encounter for other screening for malignant neoplasm of breast: Secondary | ICD-10-CM | POA: Diagnosis present

## 2018-04-09 MED FILL — HYDROCHLOROTHIAZIDE 25 MG T: 25 | 30 days supply | Qty: 30 | Fill #1

## 2018-04-20 ENCOUNTER — Ambulatory Visit (INDEPENDENT_AMBULATORY_CARE_PROVIDER_SITE_OTHER): Payer: No Typology Code available for payment source | Admitting: Obstetrics & Gynecology

## 2018-04-20 ENCOUNTER — Encounter: Payer: Self-pay | Admitting: Obstetrics & Gynecology

## 2018-04-20 VITALS — BP 114/58 | HR 76 | Ht 63.0 in | Wt 195.0 lb

## 2018-04-20 DIAGNOSIS — N951 Menopausal and female climacteric states: Secondary | ICD-10-CM | POA: Diagnosis not present

## 2018-04-20 DIAGNOSIS — N939 Abnormal uterine and vaginal bleeding, unspecified: Secondary | ICD-10-CM | POA: Diagnosis not present

## 2018-04-20 MED ORDER — NORETHIN ACE-ETH ESTRAD-FE 1-20 MG-MCG(24) PO TABS: 1.0000 | ORAL_TABLET | Freq: Every day | ORAL | 11 refills | Status: DC

## 2018-04-20 MED FILL — BLISOVI 24 FE TABLET: 1-20 | 28 days supply | Qty: 28 | Fill #0

## 2018-04-20 NOTE — Patient Instructions (Signed)

## 2018-04-20 NOTE — Progress Notes (Signed)
History:  53 y.o. [id number] here today for LMP 03/26/2018. Still reports heavy monthly cycles. Pt denies tob use.  Pt DENIES bleeding between menses. Left tube is tied. Right tube has been resected.    The following portions of the patient's history were reviewed and updated as appropriate: allergies, current medications, past family history, past medical history, past social history, past surgical history and problem list.  Review of Systems:  Pertinent items are noted in HPI.    Objective:  Physical Exam Blood pressure (!) 114/58, pulse 76, height 5\' 3"  (1.6 m), weight 195 lb (88.5 kg), last menstrual period 03/26/2018.  CONSTITUTIONAL: Well-developed, well-nourished female in no acute distress.  HENT:  Normocephalic, atraumatic EYES: Conjunctivae and EOM are normal. No scleral icterus.  NECK: Normal range of motion SKIN: Skin is warm and dry. No rash noted. Not diaphoretic.No pallor. [place]: Alert and oriented to person, place, and time. Normal coordination.  ;  Labs and Imaging US Pelvis Transvanginal Non-ob (tv Only)  Result Date: 04/07/2018 CLINICAL DATA:  Pelvic pain.  Dyspareunia. EXAM: TRANSABDOMINAL AND TRANSVAGINAL ULTRASOUND OF PELVIS TECHNIQUE: Both transabdominal and transvaginal ultrasound examinations of the pelvis were performed. Transabdominal technique was performed for global imaging of the pelvis including uterus, ovaries, adnexal regions, and pelvic cul-de-sac. It was necessary to proceed with endovaginal exam following the transabdominal exam to visualize the adnexal structures. COMPARISON:  None FINDINGS: Uterus Measurements: 9.9 x 5.5 x 6.7 cm = volume: 18.9 mL. There is a 1.0 x 1.1 x 1.1 cm fibroid within the anterior left uterine body. There is a 1.2 x 1.0 x 1.4 cm intramural fibroid posterior uterine body. Endometrium Thickness: 10 mm.  No focal abnormality visualized. Right ovary Surgically absent Left ovary Measurements: 3.2 x 1.9 x 2.5 cm = volume: 7.7 mL. Normal  appearance/no adnexal mass. Other findings No abnormal free fluid. IMPRESSION: No acute process within the pelvis. Electronically Signed   By: Lovey Newcomer M.D.   On: 04/07/2018 12:36   US Pelvis (transabdominal Only)  Result Date: 04/07/2018 CLINICAL DATA:  Pelvic pain.  Dyspareunia. EXAM: TRANSABDOMINAL AND TRANSVAGINAL ULTRASOUND OF PELVIS TECHNIQUE: Both transabdominal and transvaginal ultrasound examinations of the pelvis were performed. Transabdominal technique was performed for global imaging of the pelvis including uterus, ovaries, adnexal regions, and pelvic cul-de-sac. It was necessary to proceed with endovaginal exam following the transabdominal exam to visualize the adnexal structures. COMPARISON:  None FINDINGS: Uterus Measurements: 9.9 x 5.5 x 6.7 cm = volume: 18.9 mL. There is a 1.0 x 1.1 x 1.1 cm fibroid within the anterior left uterine body. There is a 1.2 x 1.0 x 1.4 cm intramural fibroid posterior uterine body. Endometrium Thickness: 10 mm.  No focal abnormality visualized. Right ovary Surgically absent Left ovary Measurements: 3.2 x 1.9 x 2.5 cm = volume: 7.7 mL. Normal appearance/no adnexal mass. Other findings No abnormal free fluid. IMPRESSION: No acute process within the pelvis. Electronically Signed   By: Lovey Newcomer M.D.   On: 04/07/2018 12:36   Mm 3d Screen Breast Bilateral  Result Date: 04/07/2018 CLINICAL DATA:  Screening. EXAM: DIGITAL SCREENING BILATERAL MAMMOGRAM WITH TOMO AND CAD COMPARISON:  None. ACR Breast Density Category a: The breast tissue is almost entirely fatty. FINDINGS: There are no findings suspicious for malignancy. Images were processed with CAD. IMPRESSION: No mammographic evidence of malignancy. A result letter of this screening mammogram will be mailed directly to the patient. RECOMMENDATION: Screening mammogram in one year. (Code:SM-B-01Y) BI-RADS CATEGORY  1: Negative. Electronically Signed  By: Lajean Manes M.D.   On: 04/07/2018 15:48    Assessment &  Plan:  Perimenoausep/ AUB no intermenstraul bleeding    LoEstrin 1/20 1 po q day  F/u 3 months or sooner prn  Nakesha Ebrahim L. Harraway-Smith, M.D., Cherlynn June

## 2018-04-23 ENCOUNTER — Encounter: Payer: Self-pay | Admitting: Obstetrics & Gynecology

## 2018-04-27 MED FILL — POTASSIUM CHLORIDE CRYS ER: 20 | 30 days supply | Qty: 30 | Fill #4

## 2018-04-27 MED FILL — CITALOPRAM HBR 40 MG TABLET: 40 | 30 days supply | Qty: 30 | Fill #5

## 2018-04-27 MED FILL — METOPROLOL SUCCINATE ER 100: 100 | 30 days supply | Qty: 30 | Fill #5

## 2018-04-27 MED FILL — PANTOPRAZOLE SOD DR 20 MG T: 20 | 30 days supply | Qty: 30 | Fill #5

## 2018-04-28 MED FILL — SIMVASTATIN 40 MG TABLET: 40 | 30 days supply | Qty: 30 | Fill #4

## 2018-05-05 IMAGING — DX DG CERVICAL SPINE COMPLETE 4+V
5 series · 5 of 5 positions shown · non-contrast
Comparison: 12/15/2015 and prior radiographs

CLINICAL DATA: Neck pain.

EXAM:
CERVICAL SPINE - COMPLETE 4+ VIEW

[c-spine lat]
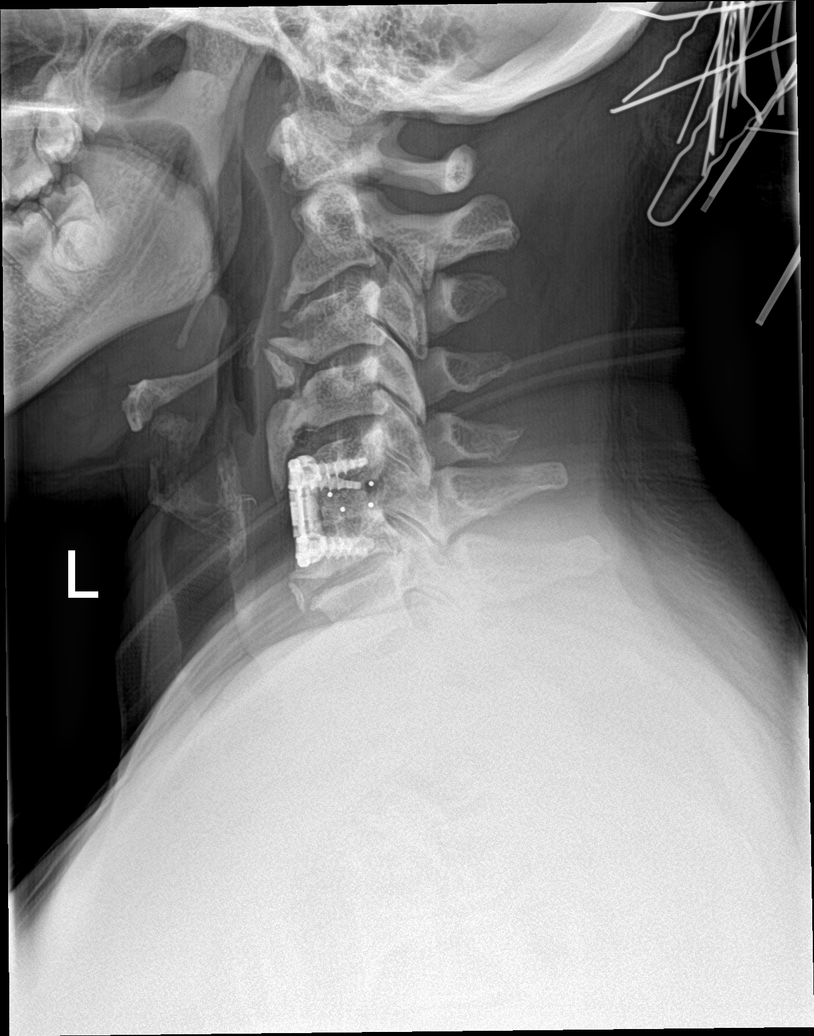

[c-spine obl (1 of 2)]
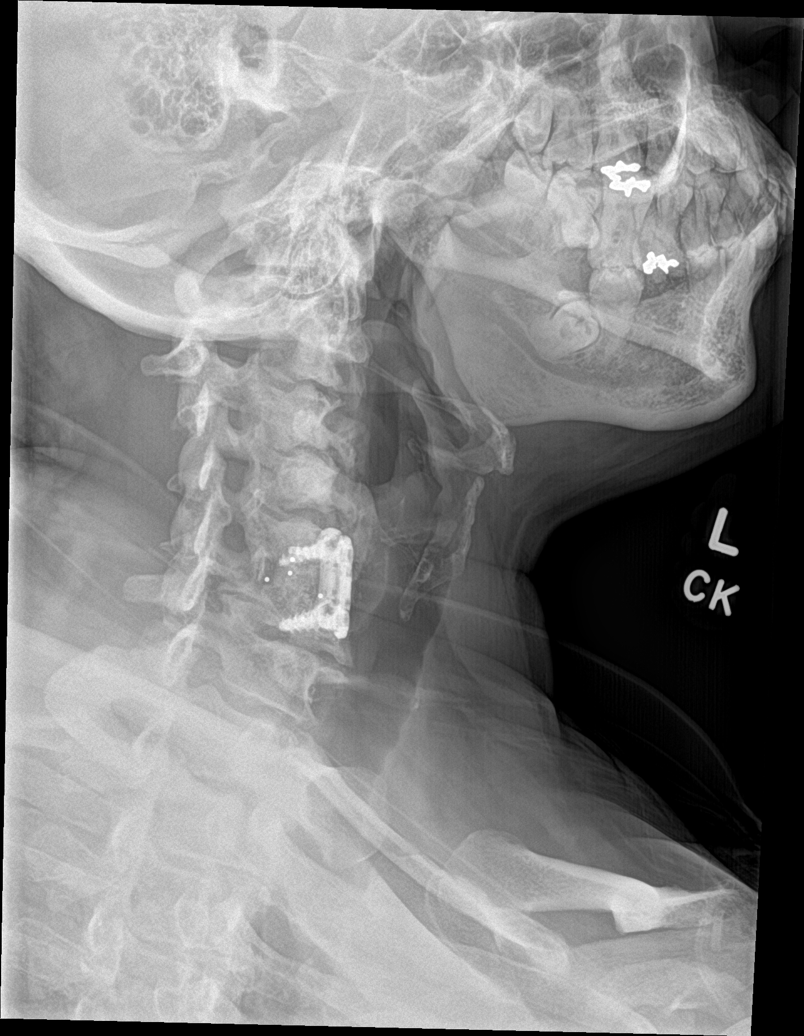

[c-spine obl (2 of 2)]
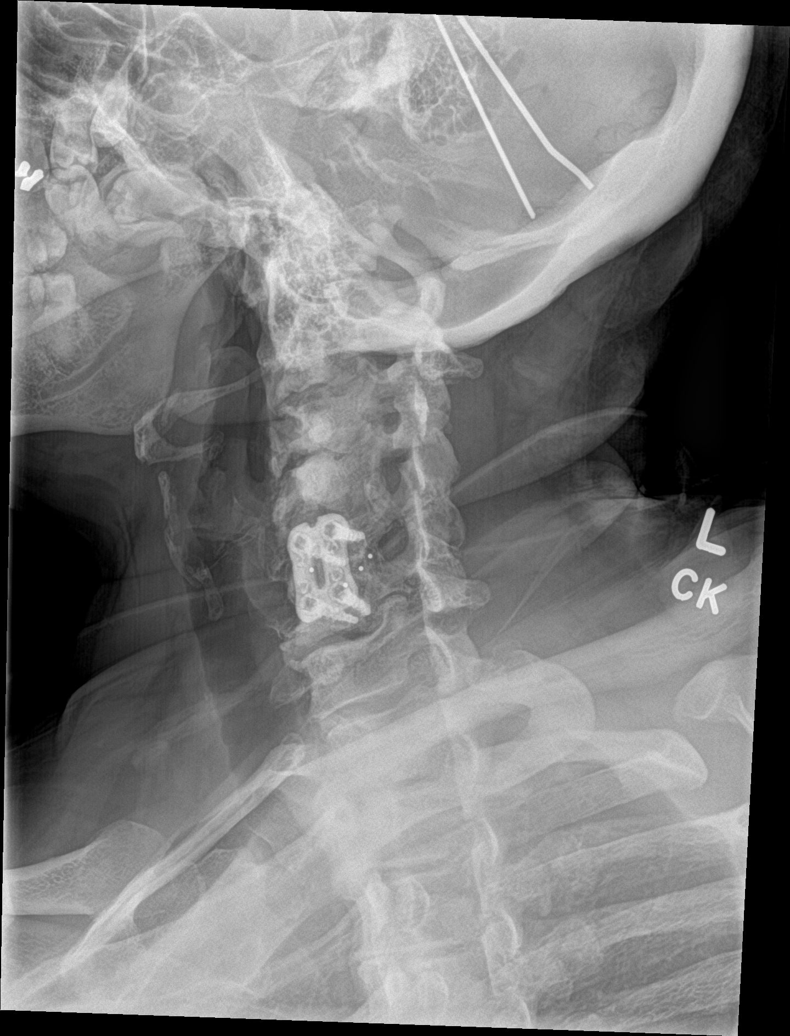

[c-spine ap]
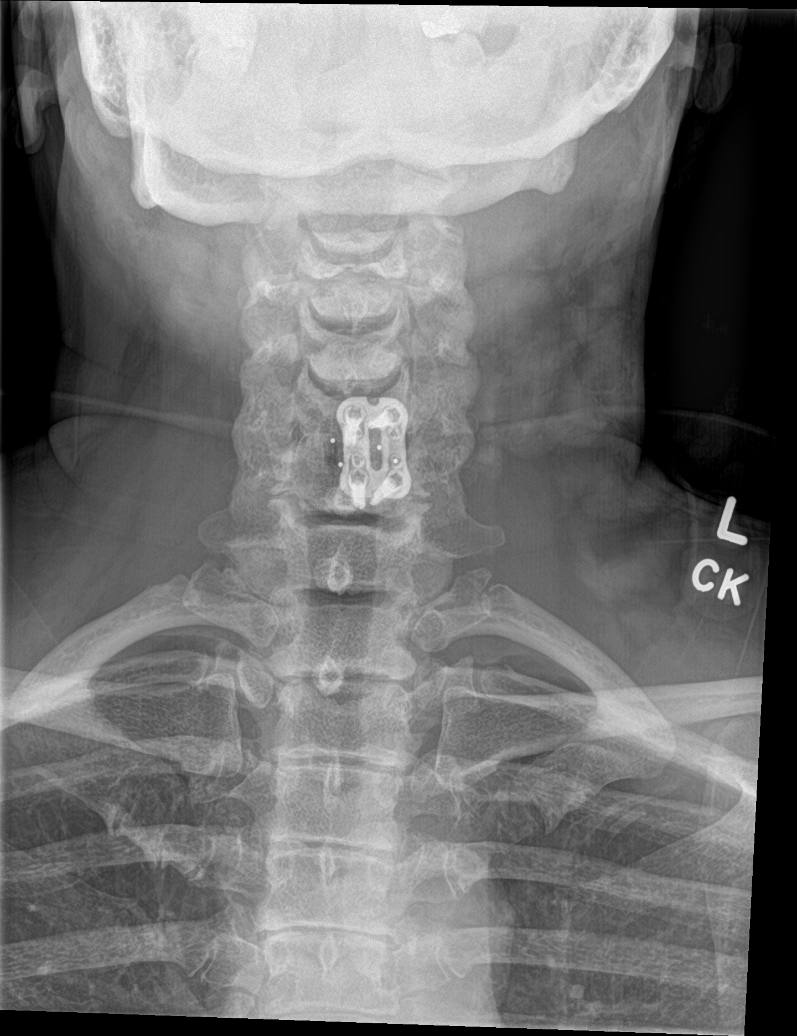

[c-spine open mouth]
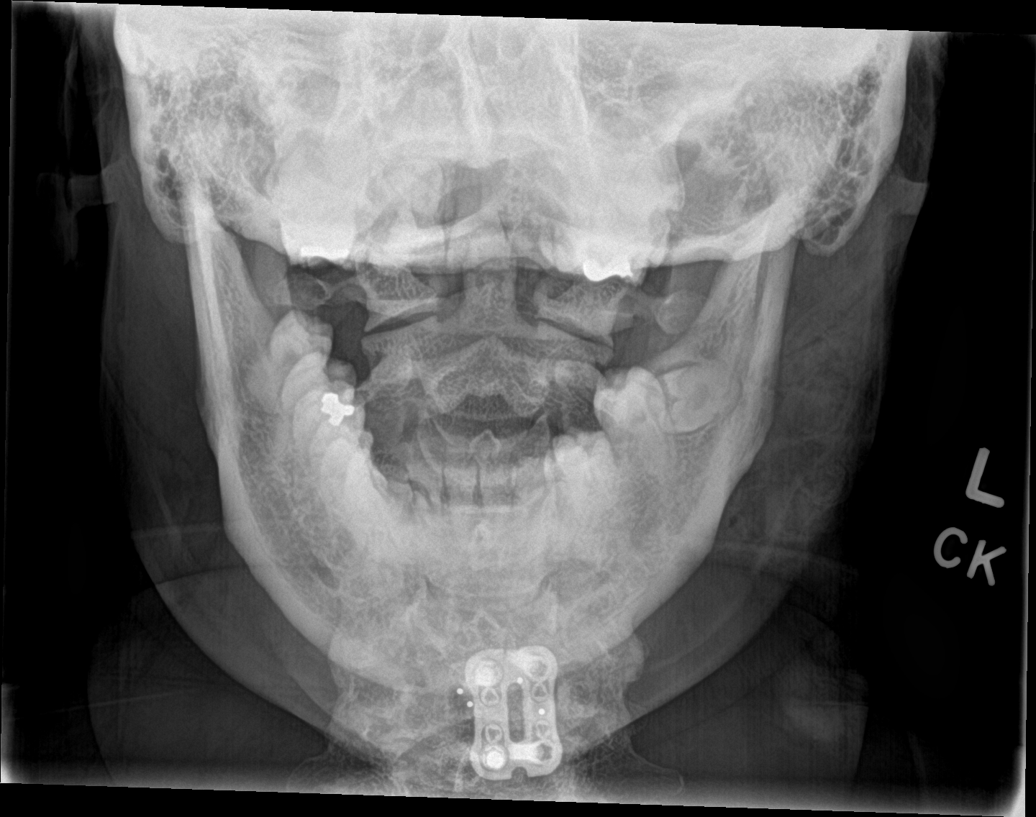

[5 of 5 positions shown; findings below may reference images not displayed]

FINDINGS: No acute fracture, subluxation or prevertebral soft tissue swelling.

Anterior/interbody fusion changes at C5-6 again noted.

Mild degenerative disc disease and mild to moderate spondylosis
again noted at multiple levels.

There is bony foraminal narrowing bilaterally at C3-4 and C6-7.

No suspicious bony lesions are identified.
IMPRESSION: 1. No acute abnormality
2. Multilevel mild degenerative disc disease and mild to moderate
spondylosis contributing to bony foraminal narrowing at C3-4 and
C6-7.

## 2018-05-18 MED FILL — HYDROCHLOROTHIAZIDE 25 MG T: 25 | 30 days supply | Qty: 30 | Fill #2

## 2018-05-22 MED FILL — BLISOVI 24 FE 1-20 MG-MCG(2: 1-20 | 28 days supply | Qty: 28 | Fill #1

## 2018-05-25 MED FILL — CITALOPRAM HBR 40 MG TABLET: 40 | 30 days supply | Qty: 30 | Fill #6

## 2018-05-27 MED FILL — POTASSIUM CHLORIDE CRYS ER: 20 | 30 days supply | Qty: 30 | Fill #0

## 2018-05-27 MED FILL — VALACYCLOVIR HCL 500 MG TAB: 500 | 30 days supply | Qty: 30 | Fill #0

## 2018-05-27 MED FILL — METOPROLOL SUCCINATE ER 100: 100 | 30 days supply | Qty: 30 | Fill #0

## 2018-05-27 MED FILL — SIMVASTATIN 40 MG TABLET: 40 | 30 days supply | Qty: 30 | Fill #0

## 2018-05-27 MED FILL — AMITRIPTYLINE HCL 25 MG TAB: 25 | 30 days supply | Qty: 30 | Fill #0

## 2018-05-28 MED FILL — PANTOPRAZOLE SOD DR 20 MG T: 20 | 30 days supply | Qty: 30 | Fill #0

## 2018-06-18 MED FILL — BLISOVI 24 FE 1-20 MG-MCG(2: 1-20 | 28 days supply | Qty: 28 | Fill #2

## 2018-06-18 MED FILL — HYDROCHLOROTHIAZIDE 25 MG T: 25 | 30 days supply | Qty: 30 | Fill #0

## 2018-06-24 MED FILL — PANTOPRAZOLE SOD DR 20 MG T: 20 | 30 days supply | Qty: 30 | Fill #1

## 2018-06-24 MED FILL — METOPROLOL SUCCINATE ER 100: 100 | 30 days supply | Qty: 30 | Fill #1

## 2018-06-24 MED FILL — CITALOPRAM HBR 40 MG TABLET: 40 | 30 days supply | Qty: 30 | Fill #7

## 2018-06-29 MED FILL — VALACYCLOVIR HCL 500 MG TAB: 500 | 30 days supply | Qty: 30 | Fill #1

## 2018-07-06 MED FILL — SIMVASTATIN 40 MG TABLET: 40 | 30 days supply | Qty: 30 | Fill #1

## 2018-07-17 MED FILL — HYDROCHLOROTHIAZIDE 25 MG T: 25 | 30 days supply | Qty: 30 | Fill #1

## 2018-07-17 MED FILL — BLISOVI 24 FE 1-20 MG-MCG(2: 1-20 | 28 days supply | Qty: 28 | Fill #3

## 2018-07-23 MED FILL — CITALOPRAM HBR 40 MG TABLET: 40 | 30 days supply | Qty: 30 | Fill #8

## 2018-07-23 MED FILL — METOPROLOL SUCCINATE ER 100: 100 | 30 days supply | Qty: 30 | Fill #2

## 2018-07-23 MED FILL — POTASSIUM CHLORIDE CRYS ER: 20 | 30 days supply | Qty: 30 | Fill #1

## 2018-07-26 MED FILL — PANTOPRAZOLE SOD DR 20 MG T: 20 | 30 days supply | Qty: 30 | Fill #2

## 2018-07-26 MED FILL — VALACYCLOVIR HCL 500 MG TAB: 500 | 30 days supply | Qty: 30 | Fill #2

## 2018-07-28 ENCOUNTER — Telehealth: Payer: Self-pay

## 2018-07-28 NOTE — Telephone Encounter (Signed)
Pt called stating that she is still having heavy bleeding, blood clots, nausea, and headaches on the LoEstrin 24 FE pills. Pt is requests a different OCP. chiquita l wilson, CMA

## 2018-07-29 ENCOUNTER — Other Ambulatory Visit: Payer: Self-pay | Admitting: Obstetrics & Gynecology

## 2018-08-05 ENCOUNTER — Encounter: Payer: Self-pay | Admitting: Obstetrics & Gynecology

## 2018-08-05 ENCOUNTER — Ambulatory Visit (INDEPENDENT_AMBULATORY_CARE_PROVIDER_SITE_OTHER): Payer: PRIVATE HEALTH INSURANCE | Admitting: Obstetrics & Gynecology

## 2018-08-05 DIAGNOSIS — I10 Essential (primary) hypertension: Secondary | ICD-10-CM | POA: Insufficient documentation

## 2018-08-05 DIAGNOSIS — E7800 Pure hypercholesterolemia, unspecified: Secondary | ICD-10-CM | POA: Diagnosis not present

## 2018-08-05 DIAGNOSIS — N939 Abnormal uterine and vaginal bleeding, unspecified: Secondary | ICD-10-CM | POA: Diagnosis not present

## 2018-08-05 DIAGNOSIS — M069 Rheumatoid arthritis, unspecified: Secondary | ICD-10-CM | POA: Insufficient documentation

## 2018-08-05 DIAGNOSIS — M059 Rheumatoid arthritis with rheumatoid factor, unspecified: Secondary | ICD-10-CM

## 2018-08-05 NOTE — Progress Notes (Signed)
TELEHEALTH VIRTUAL GYNECOLOGY VISIT ENCOUNTER NOTE  I connected with Katherine Wiggins on 08/05/18 at  4:00 PM EDT by telephone at home and verified that I am speaking with the correct person using two identifiers.   I discussed the limitations, risks, security and privacy concerns of performing an evaluation and management service by telephone and the availability of in person appointments. I also discussed with the patient that there may be a patient responsible charge related to this service. The patient expressed understanding and agreed to proceed.   History:  Katherine Wiggins is a 53 y.o. [phone] female being evaluated today for continued AUB. Pt was on Loestrin but, reports that the heavy bleeding gn .  She denies any abnormal vaginal discharge, bleeding, pelvic pain or other concerns.  On low dose OCPs since Jan . Pt cont to have heavy bleeding and blood clots and HA.   No bleedign between menses. Pt bleeds for 7 days.      Past Medical History:  Diagnosis Date  . Asthma   . Depression   . Endometriosis   . Fibromyalgia   . High cholesterol   . History of salpingectomy    Right  . Hypertension   . RA (rheumatoid arthritis) ([place])    Past Surgical History:  Procedure Laterality Date  . CERVICAL SPINE SURGERY    . RIGHT OOPHORECTOMY Right 1995  . SALPINGECTOMY Right 1995  . TUBAL LIGATION     The following portions of the patient's history were reviewed and updated as appropriate: allergies, current medications, past family history, past medical history, past social history, past surgical history and problem list.   Health Maintenance:  Normal pap and negative HRHPV on 04/03/2018.  Normal mammogram on 04/07/2018.   Review of Systems:  Pertinent items noted in HPI and remainder of comprehensive ROS otherwise negative.  Physical Exam:   General:  Alert, oriented and cooperative.   Mental Status: Normal mood and affect perceived. Normal judgment and thought content.   Physical exam deferred due to nature of the encounter  04/07/2018 CLINICAL DATA:  Pelvic pain.  Dyspareunia.  EXAM: TRANSABDOMINAL AND TRANSVAGINAL ULTRASOUND OF PELVIS  TECHNIQUE: Both transabdominal and transvaginal ultrasound examinations of the pelvis were performed. Transabdominal technique was performed for global imaging of the pelvis including uterus, ovaries, adnexal regions, and pelvic cul-de-sac. It was necessary to proceed with endovaginal exam following the transabdominal exam to visualize the adnexal structures.  COMPARISON:  None  FINDINGS: Uterus  Measurements: 9.9 x 5.5 x 6.7 cm = volume: 18.9 mL. There is a 1.0 x 1.1 x 1.1 cm fibroid within the anterior left uterine body. There is a 1.2 x 1.0 x 1.4 cm intramural fibroid posterior uterine body.  Endometrium  Thickness: 10 mm.  No focal abnormality visualized.  Right ovary  Surgically absent  Left ovary  Measurements: 3.2 x 1.9 x 2.5 cm = volume: 7.7 mL. Normal appearance/no adnexal mass.  Other findings  No abnormal free fluid.  IMPRESSION: No acute process within the pelvis.  Assessment and Plan:     AUB- sx likely related to perimenopuase  Switch to Loestrin 1.5/30  Pt needs endo bx schedule within 4 weeks       I discussed the assessment and treatment plan with the patient. The patient was provided an opportunity to ask questions and all were answered. The patient agreed with the plan and demonstrated an understanding of the instructions.   The patient was advised to call back or seek an  in-person evaluation/go to the ED if the symptoms worsen or if the condition fails to improve as anticipated.  I provided 15 minutes of non-face-to-face time during this encounter.   Lavonia Drafts, MD [facility], [place]

## 2018-08-06 ENCOUNTER — Encounter: Payer: Self-pay | Admitting: Obstetrics & Gynecology

## 2018-08-06 MED ORDER — NORETHIN ACE-ETH ESTRAD-FE 1.5-30 MG-MCG PO TABS: 1.0000 | ORAL_TABLET | Freq: Every day | ORAL | 11 refills | Status: DC

## 2018-08-07 MED FILL — LARIN FE 1.5-30 TABLET: 1.5-30 | 28 days supply | Qty: 28 | Fill #0

## 2018-08-07 MED FILL — SIMVASTATIN 40 MG TABLET: 40 | 30 days supply | Qty: 30 | Fill #2

## 2018-08-19 MED FILL — HYDROCHLOROTHIAZIDE 25 MG T: 25 | 30 days supply | Qty: 30 | Fill #2

## 2018-08-24 MED FILL — METOPROLOL SUCCINATE ER 100: 100 | 30 days supply | Qty: 30 | Fill #3

## 2018-08-24 MED FILL — CITALOPRAM HBR 40 MG TABLET: 40 | 30 days supply | Qty: 30 | Fill #9

## 2018-08-31 MED FILL — POTASSIUM CHLORIDE CRYS ER: 20 | 30 days supply | Qty: 30 | Fill #2

## 2018-08-31 MED FILL — PANTOPRAZOLE SOD DR 20 MG T: 20 | 30 days supply | Qty: 30 | Fill #3

## 2018-08-31 MED FILL — VALACYCLOVIR HCL 500 MG TAB: 500 | 30 days supply | Qty: 30 | Fill #3

## 2018-09-03 MED FILL — LARIN FE 1.5-30 TABLET: 1.5-30 | 28 days supply | Qty: 28 | Fill #1

## 2018-09-09 ENCOUNTER — Other Ambulatory Visit: Payer: PRIVATE HEALTH INSURANCE | Admitting: Obstetrics & Gynecology

## 2018-09-14 MED FILL — SIMVASTATIN 40 MG TABLET: 40 | 30 days supply | Qty: 30 | Fill #3

## 2018-09-18 MED FILL — HYDROCHLOROTHIAZIDE 25 MG T: 25 | 30 days supply | Qty: 30 | Fill #3

## 2018-09-24 MED FILL — CITALOPRAM HBR 40 MG TABLET: 40 | 30 days supply | Qty: 30 | Fill #10

## 2018-09-24 MED FILL — METOPROLOL SUCCINATE ER 100: 100 | 30 days supply | Qty: 30 | Fill #4

## 2018-10-02 MED FILL — LARIN FE 1.5-30 TABLET: 1.5-30 | 28 days supply | Qty: 28 | Fill #2

## 2018-10-02 MED FILL — PANTOPRAZOLE SOD DR 20 MG T: 20 | 30 days supply | Qty: 30 | Fill #4

## 2018-10-05 MED FILL — VALACYCLOVIR HCL 500 MG TAB: 500 | 30 days supply | Qty: 30 | Fill #4

## 2018-10-12 ENCOUNTER — Other Ambulatory Visit (HOSPITAL_COMMUNITY)
Admission: RE | Admit: 2018-10-12 | Discharge: 2018-10-12 | Disposition: A | Discharge status: Home / Self Care | Payer: PRIVATE HEALTH INSURANCE | Source: Ambulatory Visit | Attending: Obstetrics & Gynecology | Admitting: Obstetrics & Gynecology

## 2018-10-12 ENCOUNTER — Encounter: Payer: Self-pay | Admitting: Obstetrics & Gynecology

## 2018-10-12 ENCOUNTER — Other Ambulatory Visit: Payer: Self-pay

## 2018-10-12 ENCOUNTER — Ambulatory Visit (INDEPENDENT_AMBULATORY_CARE_PROVIDER_SITE_OTHER): Payer: PRIVATE HEALTH INSURANCE | Admitting: Obstetrics & Gynecology

## 2018-10-12 VITALS — BP 111/62 | HR 79 | Ht 63.0 in | Wt 188.0 lb

## 2018-10-12 DIAGNOSIS — N939 Abnormal uterine and vaginal bleeding, unspecified: Secondary | ICD-10-CM | POA: Diagnosis present

## 2018-10-12 DIAGNOSIS — N841 Polyp of cervix uteri: Secondary | ICD-10-CM

## 2018-10-12 NOTE — Progress Notes (Signed)
04/07/2018 Pt presents for endometrial bx. H/o AUB. LMP 09/27/2018. Cycle last 7 days. Sx improved with OCPs. Clots and heavy bleeding much improved.      CLINICAL DATA:  Pelvic pain.  Dyspareunia.  EXAM: TRANSABDOMINAL AND TRANSVAGINAL ULTRASOUND OF PELVIS  TECHNIQUE: Both transabdominal and transvaginal ultrasound examinations of the pelvis were performed. Transabdominal technique was performed for global imaging of the pelvis including uterus, ovaries, adnexal regions, and pelvic cul-de-sac. It was necessary to proceed with endovaginal exam following the transabdominal exam to visualize the adnexal structures.  COMPARISON:  None  FINDINGS: Uterus  Measurements: 9.9 x 5.5 x 6.7 cm = volume: 18.9 mL. There is a 1.0 x 1.1 x 1.1 cm fibroid within the anterior left uterine body. There is a 1.2 x 1.0 x 1.4 cm intramural fibroid posterior uterine body.  Endometrium  Thickness: 10 mm.  No focal abnormality visualized.  Right ovary  Surgically absent  Left ovary  Measurements: 3.2 x 1.9 x 2.5 cm = volume: 7.7 mL. Normal appearance/no adnexal mass.  Other findings  No abnormal free fluid.  IMPRESSION: No acute process within the pelvis.  The indications for endometrial biopsy were reviewed.   Risks of the biopsy including cramping, bleeding, infection, uterine perforation, inadequate specimen and need for additional procedures  were discussed. The patient states she understands and agrees to undergo procedure today. Consent was signed. Time out was performed. Urine HCG was negative. A sterile speculum was placed in the patient's vagina and the cervix was prepped with Betadine. A single-toothed tenaculum was placed on the anterior lip of the cervix to stabilize it. The 3 mm pipelle was introduced into the endometrial cavity without difficulty to a depth of 9cm, and a moderate amount of tissue was obtained and sent to pathology. The instruments were removed from the  patient's vagina. Minimal bleeding from the cervix was noted. The patient tolerated the procedure well. Routine post-procedure instructions were given to the patient.   Keep OCPs for now Will contact pt re the results of her endo bx.   Mayme Profeta L. Harraway-Smith, M.D., [place] June

## 2018-10-12 NOTE — Addendum Note (Signed)
Addended by: Lavonia Drafts on: 10/12/2018 11:50 AM   Modules accepted: Orders

## 2018-10-15 ENCOUNTER — Telehealth: Payer: Self-pay

## 2018-10-15 NOTE — Telephone Encounter (Signed)
Patient called and left message to return call to office. Kathrene Alu RN

## 2018-10-15 NOTE — Telephone Encounter (Signed)
-----   Message from Lavonia Drafts, MD sent at 10/14/2018  4:36 PM EDT ----- Please call pt. There was no cancer on her biopsy. It showed a polyp only.   Dr. Ihor Dow

## 2018-10-16 MED FILL — POTASSIUM CHLORIDE CRYS ER: 20 | 30 days supply | Qty: 30 | Fill #3

## 2018-10-16 MED FILL — SIMVASTATIN 40 MG TABLET: 40 | 30 days supply | Qty: 30 | Fill #4

## 2018-10-16 MED FILL — HYDROCHLOROTHIAZIDE 25 MG T: 25 | 30 days supply | Qty: 30 | Fill #4

## 2018-10-19 ENCOUNTER — Telehealth: Payer: Self-pay

## 2018-10-19 NOTE — Telephone Encounter (Signed)
Called pt to discuss plan of care for Plan of care for AUB and Polyp. Unable to leave message; phone rang 4 times then disconnected. Will try to contact pt later today. chiquita l wilson, CMA

## 2018-10-19 NOTE — Telephone Encounter (Signed)
-----   Message from Lavonia Drafts, MD sent at 10/19/2018  8:37 AM EDT ----- Regarding: RE: Polyp Please let her know that the OCPs alone may be sufficient. We can review it more when she returns for her 3 months f/u.  If she continues to bleed despite the meds, we have have to remove it and I will discuss that with her at that time. Please ask her to call if the bleeding is not controlled with the meds.    Clh-S ----- Message ----- From: Valentina Lucks, CMA Sent: 10/16/2018   8:31 AM EDT To: Lavonia Drafts, MD Subject: Polyp                                          Pt wants to know if Polyp can be removed.

## 2018-10-19 NOTE — Telephone Encounter (Signed)
Pt called the office to discuss POC for Polyp and AUB. Pt made aware that OCP's may be sufficient.Pt made aware that any other options will be discussed at 3 month f/u visit. chiquita l wilson, CMA

## 2018-10-22 MED FILL — METOPROLOL SUCCINATE ER 100: 100 | 30 days supply | Qty: 30 | Fill #5

## 2018-10-22 MED FILL — CITALOPRAM HBR 40 MG TABLET: 40 | 30 days supply | Qty: 30 | Fill #11

## 2018-10-30 MED FILL — LARIN FE 1.5-30 TABLET: 1.5-30 | 28 days supply | Qty: 28 | Fill #3

## 2018-11-06 MED FILL — VALACYCLOVIR HCL 500 MG TAB: 500 | 30 days supply | Qty: 30 | Fill #5

## 2018-11-06 MED FILL — PANTOPRAZOLE SOD DR 20 MG T: 20 | 30 days supply | Qty: 30 | Fill #5

## 2018-11-16 MED FILL — HYDROCHLOROTHIAZIDE 25 MG T: 25 | 30 days supply | Qty: 30 | Fill #5

## 2018-11-18 MED FILL — SIMVASTATIN 40 MG TABLET: 40 | 30 days supply | Qty: 30 | Fill #5

## 2018-11-18 MED FILL — CITALOPRAM HBR 40 MG TABLET: 40 | 30 days supply | Qty: 30 | Fill #0

## 2018-11-18 MED FILL — POTASSIUM CHLORIDE CRYS ER: 20 | 30 days supply | Qty: 30 | Fill #4

## 2018-11-18 MED FILL — METOPROLOL SUCCINATE ER 100: 100 | 30 days supply | Qty: 30 | Fill #6

## 2018-11-26 MED FILL — LARIN FE 1.5-30 TABLET: 1.5-30 | 28 days supply | Qty: 28 | Fill #4

## 2018-11-30 ENCOUNTER — Other Ambulatory Visit: Payer: Self-pay

## 2018-11-30 ENCOUNTER — Encounter (HOSPITAL_BASED_OUTPATIENT_CLINIC_OR_DEPARTMENT_OTHER): Payer: Self-pay | Admitting: Emergency Medicine

## 2018-11-30 ENCOUNTER — Emergency Department (HOSPITAL_BASED_OUTPATIENT_CLINIC_OR_DEPARTMENT_OTHER)
Admission: EM | Admit: 2018-11-30 | Discharge: 2018-11-30 | Disposition: A | Discharge status: Home / Self Care | Payer: PRIVATE HEALTH INSURANCE | Attending: Emergency Medicine | Admitting: Emergency Medicine

## 2018-11-30 DIAGNOSIS — J019 Acute sinusitis, unspecified: Secondary | ICD-10-CM | POA: Diagnosis not present

## 2018-11-30 DIAGNOSIS — R51 Headache: Secondary | ICD-10-CM | POA: Diagnosis present

## 2018-11-30 MED ORDER — AMOXICILLIN 500 MG PO CAPS: 500.0000 mg | ORAL_CAPSULE | Freq: Three times a day (TID) | ORAL | 0 refills | Status: DC

## 2018-11-30 NOTE — ED Provider Notes (Signed)
[place] EMERGENCY DEPARTMENT Provider Note   CSN: [id number] Arrival date & time: 11/30/18  [id number]     History   Chief Complaint Chief Complaint  Patient presents with  . Headache    HPI Katherine Wiggins is a 53 y.o. female.     HPI Patient presents with 1 week pain and congestion on the right side of her face.  States she is been taking decongestants without relief.  States some mild drainage down the back of her throat.  No fevers.  No difficulty seeing.  No sore throat.  No cough.  History of rheumatoid arthritis but is not on immunosuppression. Past Medical History:  Diagnosis Date  . Asthma   . Depression   . Endometriosis   . Fibromyalgia   . High cholesterol   . History of salpingectomy    Right  . Hypertension   . RA (rheumatoid arthritis) [facility])     Patient Active Problem List   Diagnosis Date Noted  . Hypertension, essential 08/05/2018  . Elevated cholesterol 08/05/2018  . Rheumatoid arthritis ([place]) 08/05/2018  . Abnormal uterine bleeding (AUB) 04/20/2018    Past Surgical History:  Procedure Laterality Date  . CERVICAL SPINE SURGERY    . RIGHT OOPHORECTOMY Right 1995  . SALPINGECTOMY Right 1995  . TUBAL LIGATION       OB History    Gravida  5   Para  3   Term  2   Preterm  1   AB  2   Living  3     SAB  2   TAB      Ectopic      Multiple      Live Births  3            Home Medications    Prior to Admission medications   Medication Sig Start Date End Date Taking? Authorizing Provider  amitriptyline (ELAVIL) 25 MG tablet Take 25 mg by mouth at bedtime.    [provider]  amoxicillin (AMOXIL) 500 MG capsule Take 1 capsule (500 mg total) by mouth 3 (three) times daily. 11/30/18   Davonna Belling, MD  aspirin 81 MG tablet Take 81 mg by mouth daily.    [provider]  citalopram (CELEXA) 40 MG tablet Take 40 mg by mouth daily.    [provider]  fluticasone (FLONASE) 50 MCG/ACT nasal  spray Place 1 spray into both nostrils daily. 12/02/17   Petrucelli, Samantha R, PA-C  metoprolol tartrate (LOPRESSOR) 100 MG tablet Take 100 mg by mouth 2 (two) times daily.    [provider]  Norethindrone Acetate-Ethinyl Estrad-FE (LOESTRIN 24 FE) 1-20 MG-MCG(24) tablet Take 1 tablet by mouth daily. Patient not taking: Reported on 10/12/2018 04/20/18   Lavonia Drafts, MD  pantoprazole (PROTONIX) 20 MG tablet Take 20 mg by mouth daily.    [provider]  ranitidine (ZANTAC) 150 MG capsule Take 150 mg by mouth every evening.    [provider]  simvastatin (ZOCOR) 40 MG tablet Take 40 mg by mouth at bedtime.      [provider]    Family History Family History  Problem Relation Age of Onset  . Diabetes Mother   . Hypertension Mother   . Diabetes Sister   . Hypertension Sister   . Hypertension Brother     Social History Social History   Tobacco Use  . Smoking status: Never Smoker  . Smokeless tobacco: Never Used  Substance Use Topics  .  Alcohol use: No  . Drug use: No     Allergies   Codeine, Macrodantin [nitrofurantoin], and Septra [sulfamethoxazole w/trimethoprim (co-trimoxazole)]   Review of Systems Review of Systems  Constitutional: Negative for appetite change and fever.  HENT: Positive for congestion, ear pain, postnasal drip, sinus pressure and sinus pain.   Respiratory: Negative for shortness of breath.   Cardiovascular: Negative for chest pain.  Gastrointestinal: Negative for abdominal pain.  Neurological: Negative for weakness.     Physical Exam Updated Vital Signs BP 129/88 (BP Location: Right Arm)   Pulse 80   Temp 98.5 F (36.9 C) (Oral)   Resp 16   Ht 5\' 3"  (1.6 m)   Wt 84.4 kg   LMP 11/24/2018   SpO2 100%   BMI 32.95 kg/m   Physical Exam Vitals signs and nursing note reviewed.  Constitutional:      Appearance: She is well-developed.  HENT:     Head:     Comments: Some tenderness over right  maxillary sinus.  Bilateral nasal congestion.  Bilateral TMs normal.  No tonsillar swelling.  No anterior cervical lymphadenopathy. Cardiovascular:     Rate and Rhythm: Regular rhythm.  Pulmonary:     Breath sounds: No wheezing, rhonchi or rales.      ED Treatments / Results  Labs (all labs ordered are listed, but only abnormal results are displayed) Labs Reviewed - No data to display  EKG None  Radiology No results found.  Procedures Procedures (including critical care time)  Medications Ordered in ED Medications - No data to display   Initial Impression / Assessment and Plan / ED Course  I have reviewed the triage vital signs and the nursing notes.  Pertinent labs & imaging results that were available during my care of the patient were reviewed by me and considered in my medical decision making (see chart for details).        Patient with sinus pressure and congestion.  Think likely sinusitis.  Patient is only had symptoms for about a week however.  Has been taking decongestants but was instructed to continue taking these.  Given prescription for antibiotics to take if symptoms do not improve.  Well-appearing.  Discharge home.  Final Clinical Impressions(s) / ED Diagnoses   Final diagnoses:  Acute non-recurrent sinusitis, unspecified location    ED Discharge Orders         Ordered    amoxicillin (AMOXIL) 500 MG capsule  3 times daily     11/30/18 0746           Davonna Belling, MD 11/30/18 [phone]

## 2018-11-30 NOTE — ED Notes (Signed)
ED Provider at bedside. 

## 2018-11-30 NOTE — ED Triage Notes (Signed)
Sinus congestion and HA, mostly on the right, for 1 week.  Also pain in right ear.

## 2018-12-04 MED FILL — PANTOPRAZOLE SOD DR 20 MG T: 20 | 90 days supply | Qty: 90 | Fill #0

## 2018-12-04 MED FILL — VALACYCLOVIR HCL 500 MG TAB: 500 | 30 days supply | Qty: 30 | Fill #6

## 2018-12-10 ENCOUNTER — Emergency Department (HOSPITAL_BASED_OUTPATIENT_CLINIC_OR_DEPARTMENT_OTHER): Payer: PRIVATE HEALTH INSURANCE

## 2018-12-10 ENCOUNTER — Encounter (HOSPITAL_BASED_OUTPATIENT_CLINIC_OR_DEPARTMENT_OTHER): Payer: Self-pay | Admitting: Emergency Medicine

## 2018-12-10 ENCOUNTER — Other Ambulatory Visit: Payer: Self-pay

## 2018-12-10 ENCOUNTER — Emergency Department (HOSPITAL_BASED_OUTPATIENT_CLINIC_OR_DEPARTMENT_OTHER)
Admission: EM | Admit: 2018-12-10 | Discharge: 2018-12-10 | Disposition: A | Discharge status: Home / Self Care | Payer: PRIVATE HEALTH INSURANCE | Attending: Emergency Medicine | Admitting: Emergency Medicine

## 2018-12-10 DIAGNOSIS — Z20828 Contact with and (suspected) exposure to other viral communicable diseases: Secondary | ICD-10-CM | POA: Diagnosis not present

## 2018-12-10 DIAGNOSIS — J189 Pneumonia, unspecified organism: Secondary | ICD-10-CM | POA: Diagnosis not present

## 2018-12-10 DIAGNOSIS — M069 Rheumatoid arthritis, unspecified: Secondary | ICD-10-CM | POA: Diagnosis not present

## 2018-12-10 DIAGNOSIS — J45909 Unspecified asthma, uncomplicated: Secondary | ICD-10-CM | POA: Insufficient documentation

## 2018-12-10 DIAGNOSIS — Z79899 Other long term (current) drug therapy: Secondary | ICD-10-CM | POA: Insufficient documentation

## 2018-12-10 DIAGNOSIS — I10 Essential (primary) hypertension: Secondary | ICD-10-CM | POA: Insufficient documentation

## 2018-12-10 DIAGNOSIS — D649 Anemia, unspecified: Secondary | ICD-10-CM | POA: Diagnosis not present

## 2018-12-10 DIAGNOSIS — Z7982 Long term (current) use of aspirin: Secondary | ICD-10-CM | POA: Insufficient documentation

## 2018-12-10 DIAGNOSIS — R002 Palpitations: Secondary | ICD-10-CM | POA: Insufficient documentation

## 2018-12-10 DIAGNOSIS — R079 Chest pain, unspecified: Secondary | ICD-10-CM | POA: Diagnosis present

## 2018-12-10 LAB — CBC WITH DIFFERENTIAL/PLATELET
Abs Immature Granulocytes: 0.02 K/uL (ref 0.00–0.07)
Basophils Absolute: 0.1 K/uL (ref 0.0–0.1)
Basophils Relative: 1 %
Eosinophils Absolute: 0.4 K/uL (ref 0.0–0.5)
Eosinophils Relative: 4 %
HCT: 34.5 % — ABNORMAL LOW (ref 36.0–46.0)
Hemoglobin: 10.8 g/dL — ABNORMAL LOW (ref 12.0–15.0)
Immature Granulocytes: 0 %
Lymphocytes Relative: 28 %
Lymphs Abs: 2.3 K/uL (ref 0.7–4.0)
MCH: 26.5 pg (ref 26.0–34.0)
MCHC: 31.3 g/dL (ref 30.0–36.0)
MCV: 84.8 fL (ref 80.0–100.0)
Monocytes Absolute: 0.9 K/uL (ref 0.1–1.0)
Monocytes Relative: 10 %
Neutro Abs: 4.8 K/uL (ref 1.7–7.7)
Neutrophils Relative %: 57 %
Platelets: 332 K/uL (ref 150–400)
RBC: 4.07 MIL/uL (ref 3.87–5.11)
RDW: 17.2 % — ABNORMAL HIGH (ref 11.5–15.5)
WBC: 8.5 K/uL (ref 4.0–10.5)
nRBC: 0 % (ref 0.0–0.2)

## 2018-12-10 LAB — BASIC METABOLIC PANEL
Anion gap: 9 (ref 5–15)
BUN: 16 mg/dL (ref 6–20)
CO2: 23 mmol/L (ref 22–32)
Calcium: 9 mg/dL (ref 8.9–10.3)
Chloride: 103 mmol/L (ref 98–111)
Creatinine, Ser: 0.88 mg/dL (ref 0.44–1.00)
GFR calc Af Amer: >60 mL/min (ref >60)
GFR calc non Af Amer: >60 mL/min (ref >60)
Glucose, Bld: 105 mg/dL — ABNORMAL HIGH (ref 70–99)
Potassium: 3.9 mmol/L (ref 3.5–5.1)
Sodium: 135 mmol/L (ref 135–145)

## 2018-12-10 LAB — D-DIMER, QUANTITATIVE: D-Dimer, Quant: 4.1 ug/mL-FEU — ABNORMAL HIGH (ref 0.00–0.50)

## 2018-12-10 LAB — TROPONIN I (HIGH SENSITIVITY): Troponin I (High Sensitivity): 3 ng/L (ref <18)

## 2018-12-10 LAB — PREGNANCY, URINE: Preg Test, Ur: NEGATIVE

## 2018-12-10 MED ORDER — LIDOCAINE VISCOUS HCL 2 % MT SOLN
15.0000 mL | Freq: Once | OROMUCOSAL | Status: AC
  Administered 2018-12-10: 16:00:00 15 mL via ORAL
  Filled 2018-12-10: qty 15

## 2018-12-10 MED ORDER — AZITHROMYCIN 250 MG PO TABS: 250.0000 mg | ORAL_TABLET | Freq: Every day | ORAL | 0 refills | Status: DC

## 2018-12-10 MED ORDER — ALUM & MAG HYDROXIDE-SIMETH 200-200-20 MG/5ML PO SUSP
30.0000 mL | Freq: Once | ORAL | Status: AC
  Administered 2018-12-10: 30 mL via ORAL
  Filled 2018-12-10: qty 30

## 2018-12-10 MED ORDER — IOHEXOL 350 MG/ML SOLN
100.0000 mL | Freq: Once | INTRAVENOUS | Status: AC
  Administered 2018-12-10: 100 mL via INTRAVENOUS

## 2018-12-10 MED FILL — AZITHROMYCIN 250 MG TABLET: 250 | 5 days supply | Qty: 6 | Fill #0

## 2018-12-10 NOTE — Discharge Instructions (Addendum)
You had a CT scan of your chest performed today. The CT scan showed a pneumonia in your right upper lobe. The radiologist recommends that you have a repeat CT scan in 6 to 12 months to be sure that this is improving. He also had labs performed today that showed that your hemoglobin is low and you are anemic. Please follow-up with your family doctor for recheck.

## 2018-12-10 NOTE — ED Triage Notes (Signed)
Pt c/o chest pain and tightness with radiation to back and palpitations that started yesterday at 1130 am after taking potassium. Pt also c/o sob, headache, denies fever.

## 2018-12-10 NOTE — ED Provider Notes (Addendum)
[facility] EMERGENCY DEPARTMENT Provider Note   CSN: [id number] Arrival date & time: 12/10/18  1439     History   Chief Complaint Chief Complaint  Patient presents with  . Chest Pain    HPI Katherine Wiggins is a 53 y.o. female.     The history is provided by the patient and medical records. No language interpreter was used.  Chest Pain  Katherine Wiggins is a 53 y.o. female who presents to the Emergency Department complaining of palpitations. She presents to the emergency department for palpitations and chest tightness that began yesterday. Symptoms are constant in nature. She feels a tightness in the center of her chest in the center of her back. There is no significant change with activity or positioning. She denies any fevers, cough, shortness of breath, abdominal pain, leg swelling or pain. She does have mild associated nausea. Her symptoms began after taking a potassium pill yesterday. She is always on potassium and this is not a new medication for her. She has a family history of blood clots. She has no history of clots. She does not take any hormone medications. She is a non-smoker. She does have a history of hypertension, hyperlipidemia, rheumatoid arthritis. Past Medical History:  Diagnosis Date  . Asthma   . Depression   . Endometriosis   . Fibromyalgia   . High cholesterol   . History of salpingectomy    Right  . Hypertension   . RA (rheumatoid arthritis) [facility])     Patient Active Problem List   Diagnosis Date Noted  . Hypertension, essential 08/05/2018  . Elevated cholesterol 08/05/2018  . Rheumatoid arthritis ([place]) 08/05/2018  . Abnormal uterine bleeding (AUB) 04/20/2018    Past Surgical History:  Procedure Laterality Date  . CERVICAL SPINE SURGERY    . RIGHT OOPHORECTOMY Right 1995  . SALPINGECTOMY Right 1995  . TUBAL LIGATION       OB History    Gravida  5   Para  3   Term  2   Preterm  1   AB  2   Living  3     SAB  2   TAB       Ectopic      Multiple      Live Births  3            Home Medications    Prior to Admission medications   Medication Sig Start Date End Date Taking? Authorizing Provider  amitriptyline (ELAVIL) 25 MG tablet Take 25 mg by mouth at bedtime.    [provider]  amoxicillin (AMOXIL) 500 MG capsule Take 1 capsule (500 mg total) by mouth 3 (three) times daily. 11/30/18   Davonna Belling, MD  aspirin 81 MG tablet Take 81 mg by mouth daily.    [provider]  citalopram (CELEXA) 40 MG tablet Take 40 mg by mouth daily.    [provider]  fluticasone (FLONASE) 50 MCG/ACT nasal spray Place 1 spray into both nostrils daily. 12/02/17   Petrucelli, Samantha R, PA-C  metoprolol tartrate (LOPRESSOR) 100 MG tablet Take 100 mg by mouth 2 (two) times daily.    [provider]  Norethindrone Acetate-Ethinyl Estrad-FE (LOESTRIN 24 FE) 1-20 MG-MCG(24) tablet Take 1 tablet by mouth daily. Patient not taking: Reported on 10/12/2018 04/20/18   Lavonia Drafts, MD  pantoprazole (PROTONIX) 20 MG tablet Take 20 mg by mouth daily.    [provider]  ranitidine (ZANTAC) 150 MG capsule Take 150  mg by mouth every evening.    [provider]  simvastatin (ZOCOR) 40 MG tablet Take 40 mg by mouth at bedtime.      [provider]    Family History Family History  Problem Relation Age of Onset  . Diabetes Mother   . Hypertension Mother   . Diabetes Sister   . Hypertension Sister   . Hypertension Brother     Social History Social History   Tobacco Use  . Smoking status: Never Smoker  . Smokeless tobacco: Never Used  Substance Use Topics  . Alcohol use: No  . Drug use: No     Allergies   Codeine, Macrodantin [nitrofurantoin], and Septra [sulfamethoxazole w/trimethoprim (co-trimoxazole)]   Review of Systems Review of Systems  Cardiovascular: Positive for chest pain.  All other systems reviewed and are negative.     Physical Exam Updated Vital Signs BP 128/74 (BP Location: Right Arm)   Pulse 70   Temp 98.5 F (36.9 C) (Oral)   Resp 19   Ht 5\' 3"  (1.6 m)   Wt 84.4 kg   LMP 11/24/2018 (Approximate)   SpO2 100%   BMI 32.95 kg/m   Physical Exam Vitals signs and nursing note reviewed.  Constitutional:      Appearance: She is well-developed.  HENT:     Head: Normocephalic and atraumatic.  Cardiovascular:     Rate and Rhythm: Normal rate and regular rhythm.     Heart sounds: No murmur.  Pulmonary:     Effort: Pulmonary effort is normal. No respiratory distress.     Breath sounds: Normal breath sounds.  Abdominal:     Palpations: Abdomen is soft.     Tenderness: There is no abdominal tenderness. There is no guarding or rebound.  Musculoskeletal:        General: No swelling or tenderness.  Skin:    General: Skin is warm and dry.  Neurological:     Mental Status: She is alert and oriented to person, place, and time.  Psychiatric:        Mood and Affect: Mood normal.        Behavior: Behavior normal.      ED Treatments / Results  Labs (all labs ordered are listed, but only abnormal results are displayed) Labs Reviewed  BASIC METABOLIC PANEL - Abnormal; Notable for the following components:      Result Value   Glucose, Bld 105 (*)    All other components within normal limits  CBC WITH DIFFERENTIAL/PLATELET - Abnormal; Notable for the following components:   Hemoglobin 10.8 (*)    HCT 34.5 (*)    RDW 17.2 (*)    All other components within normal limits  PREGNANCY, URINE  D-DIMER, QUANTITATIVE (NOT AT [facility])  TROPONIN I (HIGH SENSITIVITY)    EKG EKG Interpretation  Date/Time:  Thursday December 10 2018 14:48:21 EDT Ventricular Rate:  71 PR Interval:    QRS Duration: 102 QT Interval:  419 QTC Calculation: 456 R Axis:   11 Text Interpretation:  Sinus rhythm Probable left atrial enlargement RSR' in V1 or V2, right VCD or RVH Nonspecific T wave abnormality compared with  prior 10/10 Confirmed by Aletta Edouard [phone]) on 12/10/2018 2:52:29 PM   Radiology Dg Chest 2 View  Result Date: 12/10/2018 CLINICAL DATA:  Shortness of breath and chest pain EXAM: CHEST - 2 VIEW COMPARISON:  October 16, 2008 FINDINGS: The lungs are clear. The heart size and pulmonary vascularity are normal. No adenopathy. There is  degenerative change in the thoracic spine. There is postoperative change in the lower cervical region. IMPRESSION: No edema or consolidation. Electronically Signed   By: Lowella Grip III M.D.   On: 12/10/2018 15:11    Procedures Procedures (including critical care time)  Medications Ordered in ED Medications - No data to display   Initial Impression / Assessment and Plan / ED Course  I have reviewed the triage vital signs and the nursing notes.  Pertinent labs & imaging results that were available during my care of the patient were reviewed by me and considered in my medical decision making (see chart for details).       Patient here for evaluation of palpitations. EKG with sinus rhythm, no activity on the monitor. She has no chest pain, troponin is negative. Presentation is not consistent with ACS. D dimer was obtained given her risk factors of rheumatoid arthritis and family history of blood clots. D dimer is elevated and CTA was obtained. CTA is negative for PE but does demonstrate right upper lobe pneumonia. Discussed with patient finding of CT scan, recommend treatment with antibiotics. She did just completed a course of amoxicillin for sinusitis, will treat with azithromycin at this time. Discussed with patient importance of PCP follow-up for recheck of her hemoglobin as well as follow-up CT scan per radiology recommendations. 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  Final Clinical Impressions(s) / ED Diagnoses   Final diagnoses:  Community acquired  pneumonia of right upper lobe of lung ([place])  Palpitations  Anemia, unspecified type    ED Discharge Orders    None       Quintella Reichert, MD 12/10/18 1708    Quintella Reichert, MD 12/10/18 [phone]

## 2018-12-10 NOTE — ED Notes (Signed)
Patient transported to X-ray 

## 2018-12-11 LAB — NOVEL CORONAVIRUS, NAA (HOSP ORDER, SEND-OUT TO REF LAB; TAT 18-24 HRS): SARS-CoV-2, NAA: NOT DETECTED

## 2018-12-14 MED FILL — CEFDINIR 300 MG CAPSULE: 300 | 10 days supply | Qty: 20 | Fill #0

## 2018-12-18 MED FILL — METHOCARBAMOL 500 MG TABS: 500 | 30 days supply | Qty: 30 | Fill #0

## 2018-12-18 MED FILL — NAPROXEN 500 MG TABS: 500 | 30 days supply | Qty: 60 | Fill #0

## 2018-12-21 MED FILL — METOPROLOL SUCCINATE ER 100: 100 | 30 days supply | Qty: 30 | Fill #7

## 2018-12-21 MED FILL — SIMVASTATIN 40 MG TABLET: 40 | 30 days supply | Qty: 30 | Fill #6

## 2018-12-21 MED FILL — HYDROCHLOROTHIAZIDE 25 MG T: 25 | 30 days supply | Qty: 30 | Fill #6

## 2018-12-21 MED FILL — CITALOPRAM HBR 40 MG TABLET: 40 | 30 days supply | Qty: 30 | Fill #1

## 2018-12-25 MED FILL — LARIN FE 1.5-30 TABLET: 1.5-30 | 28 days supply | Qty: 28 | Fill #5

## 2018-12-28 MED FILL — METOPROLOL SUCCINATE ER 25: 25 | 30 days supply | Qty: 30 | Fill #0

## 2019-01-04 MED FILL — ONDANSETRON HCL 4 MG TABLET: 4 | 3 days supply | Qty: 12 | Fill #0

## 2019-01-04 MED FILL — LORAZEPAM 1 MG TABS: 1 | 7 days supply | Qty: 10 | Fill #0

## 2019-01-15 MED FILL — VALACYCLOVIR HCL 500 MG TAB: 500 | 30 days supply | Qty: 30 | Fill #7

## 2019-01-18 ENCOUNTER — Ambulatory Visit: Payer: PRIVATE HEALTH INSURANCE | Admitting: Obstetrics & Gynecology

## 2019-01-18 ENCOUNTER — Telehealth: Payer: Self-pay

## 2019-01-18 NOTE — Telephone Encounter (Signed)
Left message to start her virtual visit. Kathrene Alu RN

## 2019-01-22 MED FILL — HYDROCHLOROTHIAZIDE 25 MG T: 25 | 30 days supply | Qty: 30 | Fill #7

## 2019-01-22 MED FILL — CITALOPRAM HBR 40 MG TABLET: 40 | 30 days supply | Qty: 30 | Fill #2

## 2019-01-22 MED FILL — METOPROLOL SUCCINATE ER 100: 100 | 30 days supply | Qty: 30 | Fill #8

## 2019-01-22 MED FILL — LARIN FE 1.5-30 TABLET: 1.5-30 | 28 days supply | Qty: 28 | Fill #6

## 2019-01-27 MED FILL — METOPROLOL SUCCINATE ER 25: 25 | 90 days supply | Qty: 90 | Fill #0

## 2019-01-29 MED FILL — SIMVASTATIN 40 MG TABLET: 40 | 30 days supply | Qty: 30 | Fill #7

## 2019-02-19 MED FILL — VALACYCLOVIR HCL 500 MG TAB: 500 | 30 days supply | Qty: 30 | Fill #8

## 2019-02-19 MED FILL — POTASSIUM CHLORIDE CRYS ER: 20 | 30 days supply | Qty: 30 | Fill #5

## 2019-02-19 MED FILL — LARIN FE 1.5-30 TABLET: 1.5-30 | 28 days supply | Qty: 28 | Fill #7

## 2019-02-19 MED FILL — METOPROLOL SUCCINATE ER 100: 100 | 30 days supply | Qty: 30 | Fill #9

## 2019-02-19 MED FILL — HYDROCHLOROTHIAZIDE 25 MG T: 25 | 30 days supply | Qty: 30 | Fill #8

## 2019-02-19 MED FILL — CITALOPRAM HBR 40 MG TABLET: 40 | 30 days supply | Qty: 30 | Fill #3

## 2019-03-05 MED FILL — SIMVASTATIN 40 MG TABS: 40 | 30 days supply | Qty: 30 | Fill #8

## 2019-03-12 MED FILL — PANTOPRAZOLE SOD DR 20 MG T: 20 | 30 days supply | Qty: 30 | Fill #0

## 2019-03-18 MED FILL — LARIN FE 1.5-30 TABLET: 1.5-30 | 28 days supply | Qty: 28 | Fill #8

## 2019-03-23 MED FILL — HYDROCHLOROTHIAZIDE 25 MG T: 25 | 30 days supply | Qty: 30 | Fill #9

## 2019-03-23 MED FILL — CITALOPRAM HBR 40 MG TABLET: 40 | 30 days supply | Qty: 30 | Fill #4

## 2019-03-23 MED FILL — METOPROLOL SUCCINATE ER 100: 100 | 30 days supply | Qty: 30 | Fill #10

## 2019-03-24 MED FILL — VALACYCLOVIR HCL 500 MG TAB: 500 | 30 days supply | Qty: 30 | Fill #9

## 2019-04-05 ENCOUNTER — Ambulatory Visit: Payer: PRIVATE HEALTH INSURANCE | Admitting: Obstetrics & Gynecology

## 2019-04-09 MED FILL — SIMVASTATIN 40 MG TABS: 40 | 30 days supply | Qty: 30 | Fill #9

## 2019-04-16 MED FILL — PANTOPRAZOLE SOD DR 20 MG T: 20 | 30 days supply | Qty: 30 | Fill #0

## 2019-04-16 MED FILL — HYDROCHLOROTHIAZIDE 25 MG T: 25 | 30 days supply | Qty: 30 | Fill #10

## 2019-04-16 MED FILL — LARIN FE 1.5-30 TABLET: 1.5-30 | 28 days supply | Qty: 28 | Fill #9

## 2019-04-21 ENCOUNTER — Encounter: Payer: Self-pay | Admitting: Obstetrics & Gynecology

## 2019-04-21 ENCOUNTER — Ambulatory Visit (INDEPENDENT_AMBULATORY_CARE_PROVIDER_SITE_OTHER): Payer: PRIVATE HEALTH INSURANCE | Admitting: Obstetrics & Gynecology

## 2019-04-21 ENCOUNTER — Other Ambulatory Visit: Payer: Self-pay

## 2019-04-21 VITALS — BP 116/55 | HR 67 | Ht 63.0 in | Wt 192.0 lb

## 2019-04-21 DIAGNOSIS — N951 Menopausal and female climacteric states: Secondary | ICD-10-CM | POA: Diagnosis not present

## 2019-04-21 DIAGNOSIS — N939 Abnormal uterine and vaginal bleeding, unspecified: Secondary | ICD-10-CM | POA: Diagnosis not present

## 2019-04-21 DIAGNOSIS — Z113 Encounter for screening for infections with a predominantly sexual mode of transmission: Secondary | ICD-10-CM | POA: Diagnosis not present

## 2019-04-21 DIAGNOSIS — R102 Pelvic and perineal pain: Secondary | ICD-10-CM

## 2019-04-21 DIAGNOSIS — N898 Other specified noninflammatory disorders of vagina: Secondary | ICD-10-CM

## 2019-04-21 NOTE — Patient Instructions (Signed)

## 2019-04-21 NOTE — Progress Notes (Signed)
Patient reporting Oct- 4 days of bleeding Nov- Period had clots and headaches, heavy bleeding Dec&Jan- lighter 4 days of bleeding  Patient complaining of discharge and nauseated 3 out of 4 weeks of the month. Patient also notes pain with intercourse. Kathrene Alu RN

## 2019-04-21 NOTE — Progress Notes (Signed)
History:  54 y.o. [id number] here today for irreg cycles. She reports that since Oct her menses have been lighter and assoc with hot flushes. The cycles have been very light and sometimes not 'reaching the pad'. She did have one cycles that was heavier than usual but, it was on cycle. She considers her hot flashes miserable but, says that she does not think that they are as severe as some people have described.  Pt does reports a pain in her lower abd that does not appear to be related to the bleeding and is worse with intercourse. Lastly, she reports a vaginal discharge that is new. She took Monistat a week and a half ago but, was not sure she had a yeast infection. She occ has an odor.   The following portions of the patient's history were reviewed and updated as appropriate: allergies, current medications, past family history, past medical history, past social history, past surgical history and problem list.  Review of Systems:  Pertinent items are noted in HPI.     Objective:  Physical Exam Blood pressure (!) 116/55, pulse 67, height 5\' 3"  (1.6 m), weight 192 lb (87.1 kg), last menstrual period 04/12/2019.  CONSTITUTIONAL: Well-developed, well-nourished female in no acute distress.  HENT:  Normocephalic, atraumatic EYES: Conjunctivae and EOM are normal. No scleral icterus.  NECK: Normal range of motion SKIN: Skin is warm and dry. No rash noted. Not diaphoretic.No pallor. [place]: Alert and oriented to person, place, and time. Normal coordination.  Abd: Soft, nontender and nondistended Pelvic: Normal appearing external genitalia; normal appearing vaginal mucosa and cervix.  Normal discharge.  Uterus does not appear to be appreciably enlarged. The exam is limited y pts body habitus. No palpable masses, no uterine or adnexal tenderness   Assessment & Plan:  Oligomenorrhea- suspect due to perimenopause.   Reviewed and discussed with pt the sx of perimenopause and the definition of menopause.    All of her questions were answered and written info was given to the pt.   Pelvic pain. Pt with a h/o uterine fibroids.   Pelvic US.   Vaginal Discharge-  Pt encouraged not to treat unless a clear dx is established.   Affirm sent.     Rec f/u in 3 months or sooner prn. Pt was encouraged to call if there is any change that is concerning to her.   Total face-to-face time with patient was 18 min.  Greater than 50% was spent in counseling and coordination of care with the patient.   Janila Arrazola L. Harraway-Smith, M.D., Cherlynn June

## 2019-04-23 LAB — CERVICOVAGINAL ANCILLARY ONLY
Bacterial Vaginitis (gardnerella): NEGATIVE
Candida Glabrata: NEGATIVE
Candida Vaginitis: NEGATIVE
Chlamydia: NEGATIVE
Comment: NEGATIVE
Comment: NEGATIVE
Comment: NEGATIVE
Comment: NEGATIVE
Comment: NEGATIVE
Comment: NORMAL
Neisseria Gonorrhea: NEGATIVE
Trichomonas: NEGATIVE

## 2019-04-23 MED FILL — POTASSIUM CHLORIDE CRYS ER: 20 | 30 days supply | Qty: 30 | Fill #6

## 2019-04-23 MED FILL — METOPROLOL SUCCINATE ER 100: 100 | 30 days supply | Qty: 30 | Fill #11

## 2019-04-23 MED FILL — CITALOPRAM HBR 40 MG TABLET: 40 | 30 days supply | Qty: 30 | Fill #5

## 2019-04-23 MED FILL — AMITRIPTYLINE HCL 25 MG TAB: 25 | 30 days supply | Qty: 30 | Fill #1

## 2019-04-27 ENCOUNTER — Other Ambulatory Visit: Payer: Self-pay

## 2019-04-27 ENCOUNTER — Ambulatory Visit (HOSPITAL_BASED_OUTPATIENT_CLINIC_OR_DEPARTMENT_OTHER): Admission: RE | Admit: 2019-04-27 | Payer: Self-pay | Source: Ambulatory Visit

## 2019-04-27 ENCOUNTER — Ambulatory Visit (HOSPITAL_BASED_OUTPATIENT_CLINIC_OR_DEPARTMENT_OTHER)
Admission: RE | Admit: 2019-04-27 | Discharge: 2019-04-27 | Disposition: A | Discharge status: Home / Self Care | Payer: PRIVATE HEALTH INSURANCE | Source: Ambulatory Visit | Attending: Obstetrics & Gynecology | Admitting: Obstetrics & Gynecology

## 2019-04-27 DIAGNOSIS — R102 Pelvic and perineal pain: Secondary | ICD-10-CM | POA: Insufficient documentation

## 2019-04-27 DIAGNOSIS — N939 Abnormal uterine and vaginal bleeding, unspecified: Secondary | ICD-10-CM | POA: Insufficient documentation

## 2019-04-27 MED FILL — METOPROLOL SUCCINATE ER 25: 25 | 90 days supply | Qty: 90 | Fill #1

## 2019-04-27 MED FILL — VALACYCLOVIR HCL 500 MG TAB: 500 | 30 days supply | Qty: 30 | Fill #10

## 2019-05-14 MED FILL — SIMVASTATIN 40 MG TABS: 40 | 30 days supply | Qty: 30 | Fill #10

## 2019-05-14 MED FILL — PANTOPRAZOLE SOD DR 20 MG T: 20 | 30 days supply | Qty: 30 | Fill #0

## 2019-05-14 MED FILL — HYDROCHLOROTHIAZIDE 25 MG T: 25 | 30 days supply | Qty: 30 | Fill #11

## 2019-05-19 MED FILL — LARIN FE 1.5-30 TABLET: 1.5-30 | 28 days supply | Qty: 28 | Fill #10

## 2019-05-24 ENCOUNTER — Other Ambulatory Visit (HOSPITAL_BASED_OUTPATIENT_CLINIC_OR_DEPARTMENT_OTHER): Payer: Self-pay | Admitting: Internal Medicine

## 2019-05-24 MED FILL — METHOCARBAMOL 500 MG TABS: 500 | 30 days supply | Qty: 30 | Fill #0

## 2019-05-24 MED FILL — SIMVASTATIN 40 MG TABS: 40 | 90 days supply | Qty: 90 | Fill #0

## 2019-05-24 MED FILL — CITALOPRAM HBR 40 MG TABLET: 40 | 90 days supply | Qty: 90 | Fill #0

## 2019-05-24 MED FILL — HYDROCHLOROTHIAZIDE 25 MG T: 25 | 90 days supply | Qty: 90 | Fill #0

## 2019-05-24 MED FILL — VALACYCLOVIR HCL 500 MG TAB: 500 | 30 days supply | Qty: 30 | Fill #0

## 2019-05-24 MED FILL — NAPROXEN 500 MG TABS: 500 | 30 days supply | Qty: 60 | Fill #0

## 2019-05-24 MED FILL — PANTOPRAZOLE SOD DR 20 MG T: 20 | 30 days supply | Qty: 30 | Fill #0

## 2019-05-24 MED FILL — METOPROLOL SUCCINATE ER 100: 100 | 90 days supply | Qty: 90 | Fill #0

## 2019-05-24 MED FILL — AMITRIPTYLINE HCL 25 MG TAB: 25 | 90 days supply | Qty: 90 | Fill #0

## 2019-05-24 MED FILL — POTASSIUM CHLORIDE CRYS ER: 20 | 90 days supply | Qty: 90 | Fill #0

## 2019-06-18 MED FILL — LARIN FE 1.5-30 TABLET: 1.5-30 | 28 days supply | Qty: 28 | Fill #11

## 2019-07-02 MED FILL — VALACYCLOVIR HCL 500 MG TAB: 500 | 30 days supply | Qty: 30 | Fill #1

## 2019-07-30 ENCOUNTER — Other Ambulatory Visit: Payer: Self-pay

## 2019-07-30 MED ORDER — NORETHINDRONE ACET-ETHINYL EST 1.5-30 MG-MCG PO TABS: 1.0000 | ORAL_TABLET | Freq: Every day | ORAL | 11 refills | Status: DC

## 2019-07-30 MED FILL — METOPROLOL SUCCINATE ER 25: 25 | 90 days supply | Qty: 90 | Fill #0

## 2019-07-30 MED FILL — JUNEL 1.5-30 TABLET: 1.5-30 | 21 days supply | Qty: 21 | Fill #0

## 2019-07-30 MED FILL — PANTOPRAZOLE SOD DR 20 MG T: 20 | 90 days supply | Qty: 90 | Fill #0

## 2019-08-02 ENCOUNTER — Ambulatory Visit: Payer: PRIVATE HEALTH INSURANCE | Admitting: Obstetrics & Gynecology

## 2019-08-10 MED FILL — HYDROCODON-APAP 5-325: 5-325 | 2 days supply | Qty: 8 | Fill #0

## 2019-08-10 MED FILL — ONDANSETRON ODT 4MG TBDP: 4 | 4 days supply | Qty: 6 | Fill #0

## 2019-08-10 MED FILL — LORazepam 2 MG TABS: 2 | 1 days supply | Qty: 1 | Fill #0

## 2019-08-10 MED FILL — AMOXICILLIN 250 MG CAPSULE: 250 | 5 days supply | Qty: 15 | Fill #0

## 2019-08-20 MED FILL — CITALOPRAM HBR 40 MG TABLET: 40 | 90 days supply | Qty: 90 | Fill #1

## 2019-08-20 MED FILL — METOPROLOL SUCCINATE ER 100: 100 | 90 days supply | Qty: 90 | Fill #1

## 2019-08-22 ENCOUNTER — Emergency Department (HOSPITAL_BASED_OUTPATIENT_CLINIC_OR_DEPARTMENT_OTHER): Payer: BLUE CROSS/BLUE SHIELD

## 2019-08-22 ENCOUNTER — Emergency Department (HOSPITAL_BASED_OUTPATIENT_CLINIC_OR_DEPARTMENT_OTHER)
Admission: EM | Admit: 2019-08-22 | Discharge: 2019-08-22 | Disposition: A | Discharge status: Home / Self Care | Payer: BLUE CROSS/BLUE SHIELD | Attending: Emergency Medicine | Admitting: Emergency Medicine

## 2019-08-22 ENCOUNTER — Other Ambulatory Visit: Payer: Self-pay

## 2019-08-22 ENCOUNTER — Encounter (HOSPITAL_BASED_OUTPATIENT_CLINIC_OR_DEPARTMENT_OTHER): Payer: Self-pay

## 2019-08-22 DIAGNOSIS — E876 Hypokalemia: Secondary | ICD-10-CM | POA: Diagnosis not present

## 2019-08-22 DIAGNOSIS — R1030 Lower abdominal pain, unspecified: Secondary | ICD-10-CM | POA: Insufficient documentation

## 2019-08-22 DIAGNOSIS — Z79899 Other long term (current) drug therapy: Secondary | ICD-10-CM | POA: Insufficient documentation

## 2019-08-22 DIAGNOSIS — D251 Intramural leiomyoma of uterus: Secondary | ICD-10-CM | POA: Diagnosis not present

## 2019-08-22 DIAGNOSIS — R945 Abnormal results of liver function studies: Secondary | ICD-10-CM | POA: Insufficient documentation

## 2019-08-22 DIAGNOSIS — K769 Liver disease, unspecified: Secondary | ICD-10-CM | POA: Insufficient documentation

## 2019-08-22 DIAGNOSIS — R7989 Other specified abnormal findings of blood chemistry: Secondary | ICD-10-CM

## 2019-08-22 LAB — CBC WITH DIFFERENTIAL/PLATELET
Abs Immature Granulocytes: 0.02 10*3/uL (ref 0.00–0.07)
Basophils Absolute: 0.1 10*3/uL (ref 0.0–0.1)
Basophils Relative: 1 %
Eosinophils Absolute: 0.2 10*3/uL (ref 0.0–0.5)
Eosinophils Relative: 2 %
HCT: 33.9 % — ABNORMAL LOW (ref 36.0–46.0)
Hemoglobin: 10.9 g/dL — ABNORMAL LOW (ref 12.0–15.0)
Immature Granulocytes: 0 %
Lymphocytes Relative: 21 %
Lymphs Abs: 1.5 10*3/uL (ref 0.7–4.0)
MCH: 26.6 pg (ref 26.0–34.0)
MCHC: 32.2 g/dL (ref 30.0–36.0)
MCV: 82.7 fL (ref 80.0–100.0)
Monocytes Absolute: 0.8 10*3/uL (ref 0.1–1.0)
Monocytes Relative: 11 %
Neutro Abs: 4.8 10*3/uL (ref 1.7–7.7)
Neutrophils Relative %: 65 %
Platelets: 363 10*3/uL (ref 150–400)
RBC: 4.1 MIL/uL (ref 3.87–5.11)
RDW: 17.1 % — ABNORMAL HIGH (ref 11.5–15.5)
WBC: 7.3 10*3/uL (ref 4.0–10.5)
nRBC: 0 % (ref 0.0–0.2)

## 2019-08-22 LAB — COMPREHENSIVE METABOLIC PANEL
ALT: 213 U/L — ABNORMAL HIGH (ref 0–44)
AST: 75 U/L — ABNORMAL HIGH (ref 15–41)
Albumin: 3.5 g/dL (ref 3.5–5.0)
Alkaline Phosphatase: 54 U/L (ref 38–126)
Anion gap: 10 (ref 5–15)
BUN: 13 mg/dL (ref 6–20)
CO2: 24 mmol/L (ref 22–32)
Calcium: 8.9 mg/dL (ref 8.9–10.3)
Chloride: 103 mmol/L (ref 98–111)
Creatinine, Ser: 0.84 mg/dL (ref 0.44–1.00)
GFR calc Af Amer: >60 mL/min (ref >60)
GFR calc non Af Amer: >60 mL/min (ref >60)
Glucose, Bld: 137 mg/dL — ABNORMAL HIGH (ref 70–99)
Potassium: 3.2 mmol/L — ABNORMAL LOW (ref 3.5–5.1)
Sodium: 137 mmol/L (ref 135–145)
Total Bilirubin: 0.3 mg/dL (ref 0.3–1.2)
Total Protein: 7.9 g/dL (ref 6.5–8.1)

## 2019-08-22 LAB — URINALYSIS, ROUTINE W REFLEX MICROSCOPIC
Bilirubin Urine: NEGATIVE
Glucose, UA: NEGATIVE mg/dL
Hgb urine dipstick: NEGATIVE
Ketones, ur: NEGATIVE mg/dL
Nitrite: NEGATIVE
Protein, ur: NEGATIVE mg/dL
Specific Gravity, Urine: 1.025 (ref 1.005–1.030)
pH: 5.5 (ref 5.0–8.0)

## 2019-08-22 LAB — URINALYSIS, MICROSCOPIC (REFLEX)

## 2019-08-22 LAB — LIPASE, BLOOD: Lipase: 25 U/L (ref 11–51)

## 2019-08-22 MED ORDER — POTASSIUM CHLORIDE CRYS ER 20 MEQ PO TBCR
40.0000 meq | EXTENDED_RELEASE_TABLET | Freq: Once | ORAL | Status: AC
  Administered 2019-08-22: 40 meq via ORAL
  Filled 2019-08-22: qty 2

## 2019-08-22 MED ORDER — FENTANYL CITRATE (PF) 100 MCG/2ML IJ SOLN: 50.0000 ug | INTRAMUSCULAR | Status: DC | PRN

## 2019-08-22 MED ORDER — ACETAMINOPHEN 500 MG PO TABS
1000.0000 mg | ORAL_TABLET | Freq: Once | ORAL | Status: AC
  Administered 2019-08-22: 1000 mg via ORAL
  Filled 2019-08-22: qty 2

## 2019-08-22 MED ORDER — IOHEXOL 300 MG/ML  SOLN
100.0000 mL | Freq: Once | INTRAMUSCULAR | Status: AC | PRN
  Administered 2019-08-22: 100 mL via INTRAVENOUS

## 2019-08-22 NOTE — Discharge Instructions (Addendum)
Follow-up urine culture result with your primary doctor and have recheck in 2 days.  Follow-up closely with primary doctor and gastroenterology for further work-up and MRI of your abdomen/liver area. Return for new or worsening symptoms.

## 2019-08-22 NOTE — ED Triage Notes (Signed)
Pt arrives with c/o lower abdominal pain radiating into back on both sides. Reports trouble with lifting at work and pain with ambulation.

## 2019-08-22 NOTE — ED Notes (Signed)
Patient transported to CT 

## 2019-08-22 NOTE — ED Provider Notes (Signed)
[facility] EMERGENCY DEPARTMENT Provider Note   CSN: [id number] Arrival date & time: 08/22/19  0757     History Chief Complaint  Patient presents with  . Abdominal Pain  . Back Pain    Katherine Wiggins is a 54 y.o. female.  Patient presents with lower abdominal discomforts mild radiation to the back bilateral over the past 3 days.  Patient's had right hip problems but never pain on the left with this.  No new injuries.  Pain is worse with lifting at work and walking.  No fevers or chills or vomiting.  No blood in the stools.  No vaginal symptoms.  Mild pressure with urination however no dysuria.        Past Medical History:  Diagnosis Date  . Asthma   . Depression   . Endometriosis   . Fibromyalgia   . High cholesterol   . History of salpingectomy    Right  . Hypertension   . RA (rheumatoid arthritis) [facility])     Patient Active Problem List   Diagnosis Date Noted  . Hypertension, essential 08/05/2018  . Elevated cholesterol 08/05/2018  . Rheumatoid arthritis ([place]) 08/05/2018  . Abnormal uterine bleeding (AUB) 04/20/2018    Past Surgical History:  Procedure Laterality Date  . CERVICAL SPINE SURGERY    . RIGHT OOPHORECTOMY Right 1995  . SALPINGECTOMY Right 1995  . TUBAL LIGATION       OB History    Gravida  5   Para  3   Term  2   Preterm  1   AB  2   Living  3     SAB  2   TAB      Ectopic      Multiple      Live Births  3           Family History  Problem Relation Age of Onset  . Diabetes Mother   . Hypertension Mother   . Diabetes Sister   . Hypertension Sister   . Hypertension Brother     Social History   Tobacco Use  . Smoking status: Never Smoker  . Smokeless tobacco: Never Used  Substance Use Topics  . Alcohol use: No  . Drug use: No    Home Medications Prior to Admission medications   Medication Sig Start Date End Date Taking? Authorizing Provider  amitriptyline (ELAVIL) 25 MG tablet Take 25 mg by  mouth at bedtime.    [provider]  amoxicillin (AMOXIL) 500 MG capsule Take 1 capsule (500 mg total) by mouth 3 (three) times daily. Patient not taking: Reported on 04/21/2019 11/30/18   Davonna Belling, MD  aspirin 81 MG tablet Take 81 mg by mouth daily.    [provider]  azithromycin (ZITHROMAX) 250 MG tablet Take 1 tablet (250 mg total) by mouth daily. Take first 2 tablets together, then 1 every day until finished. Patient not taking: Reported on 04/21/2019 12/10/18   Quintella Reichert, MD  citalopram (CELEXA) 40 MG tablet Take 40 mg by mouth daily.    [provider]  fluticasone (FLONASE) 50 MCG/ACT nasal spray Place 1 spray into both nostrils daily. 12/02/17   Petrucelli, Samantha R, PA-C  hydrochlorothiazide (HYDRODIURIL) 25 MG tablet Take by mouth. 05/27/18   [provider]  metoprolol tartrate (LOPRESSOR) 100 MG tablet Take 100 mg by mouth 2 (two) times daily.    [provider]  Norethindrone Acetate-Ethinyl Estrad-FE (LOESTRIN 24 FE) 1-20 MG-MCG(24) tablet Take 1  tablet by mouth daily. Patient not taking: Reported on 10/12/2018 04/20/18   Lavonia Drafts, MD  Norethindrone Acetate-Ethinyl Estradiol (LARIN 1.5/30) 1.5-30 MG-MCG tablet Take 1 tablet by mouth daily. 07/30/19   Lavonia Drafts, MD  pantoprazole (PROTONIX) 20 MG tablet Take 20 mg by mouth daily.    [provider]  potassium chloride SA (KLOR-CON) 20 MEQ tablet Take by mouth. 05/27/18   [provider]  ranitidine (ZANTAC) 150 MG capsule Take 150 mg by mouth every evening.    [provider]  simvastatin (ZOCOR) 40 MG tablet Take 40 mg by mouth at bedtime.      [provider]    Allergies    Codeine, Nitrofurantoin, Sulfamethoxazole-trimethoprim, and Septra [sulfamethoxazole w/trimethoprim (co-trimoxazole)]  Review of Systems   Review of Systems  Constitutional: Negative for chills and fever.  HENT: Negative for congestion.     Eyes: Negative for visual disturbance.  Respiratory: Negative for shortness of breath.   Cardiovascular: Negative for chest pain.  Gastrointestinal: Positive for abdominal pain. Negative for diarrhea and vomiting.  Genitourinary: Negative for dysuria and flank pain.  Musculoskeletal: Negative for back pain, neck pain and neck stiffness.  Skin: Negative for rash.  Neurological: Negative for light-headedness and headaches.    Physical Exam Updated Vital Signs BP 126/61 (BP Location: Right Arm)   Pulse 66   Temp 98.1 F (36.7 C) (Oral)   Resp 18   Ht 5\' 3"  (1.6 m)   Wt 88 kg   SpO2 99%   BMI 34.37 kg/m   Physical Exam Vitals and nursing note reviewed.  Constitutional:      Appearance: She is well-developed.  HENT:     Head: Normocephalic and atraumatic.  Eyes:     General:        Right eye: No discharge.        Left eye: No discharge.     Conjunctiva/sclera: Conjunctivae normal.  Neck:     Trachea: No tracheal deviation.  Cardiovascular:     Rate and Rhythm: Normal rate and regular rhythm.  Pulmonary:     Effort: Pulmonary effort is normal.     Breath sounds: Normal breath sounds.  Abdominal:     General: There is no distension.     Palpations: Abdomen is soft.     Tenderness: There is abdominal tenderness in the suprapubic area. There is no guarding.  Musculoskeletal:     Cervical back: Normal range of motion and neck supple.  Skin:    General: Skin is warm.     Findings: No rash.  Neurological:     Mental Status: She is alert and oriented to person, place, and time.     ED Results / Procedures / Treatments   Labs (all labs ordered are listed, but only abnormal results are displayed) Labs Reviewed  COMPREHENSIVE METABOLIC PANEL - Abnormal; Notable for the following components:      Result Value   Potassium 3.2 (*)    Glucose, Bld 137 (*)    AST 75 (*)    ALT 213 (*)    All other components within normal limits  CBC WITH DIFFERENTIAL/PLATELET -  Abnormal; Notable for the following components:   Hemoglobin 10.9 (*)    HCT 33.9 (*)    RDW 17.1 (*)    All other components within normal limits  URINALYSIS, ROUTINE W REFLEX MICROSCOPIC - Abnormal; Notable for the following components:   APPearance HAZY (*)    Leukocytes,Ua SMALL (*)  All other components within normal limits  URINALYSIS, MICROSCOPIC (REFLEX) - Abnormal; Notable for the following components:   Bacteria, UA MANY (*)    All other components within normal limits  URINE CULTURE  LIPASE, BLOOD    EKG None  Radiology CT ABDOMEN PELVIS W CONTRAST  Result Date: 08/22/2019 CLINICAL DATA:  54 year old female with acute lower abdominal and pelvic pain 4 days. EXAM: CT ABDOMEN AND PELVIS WITH CONTRAST TECHNIQUE: Multidetector CT imaging of the abdomen and pelvis was performed using the standard protocol following bolus administration of intravenous contrast. CONTRAST:  133mL OMNIPAQUE IOHEXOL 300 MG/ML  SOLN COMPARISON:  01/03/2019 and prior CTs FINDINGS: Lower chest: No acute abnormality Hepatobiliary: A 2 x 2.5 cm focal area of increased density/mass is identified within the MEDIAL LEFT hepatic segment (series 2: Image 19), not visualized on prior CTs. The remainder of the liver is unremarkable. Gallbladder abnormalities are noted no. No biliary dilatation. Pancreas: Unremarkable Spleen: Unremarkable Adrenals/Urinary Tract: The kidneys, adrenal glands bladder are unremarkable. Stomach/Bowel: Stomach is within normal limits. Appendix appears normal. No evidence of bowel wall thickening, distention, or inflammatory changes. Vascular/Lymphatic: Aortic atherosclerosis. No enlarged abdominal or pelvic lymph nodes. Reproductive: Heterogeneous/fibroid uterus again noted. No adnexal abnormalities identified. Other: No free fluid, focal collection or pneumoperitoneum. Musculoskeletal: No acute or suspicious bony abnormalities noted. Bilateral femoral head AVN, RIGHT greater than LEFT, again  noted. IMPRESSION: 1. No acute abnormality. 2. 2 x 2.5 cm focal area of increased density/mass within the LEFT liver - recommend elective MRI with and without contrast for further evaluation. 3. Probable fibroid uterus. 4. Bilateral femoral head AVN, RIGHT greater than LEFT. 5. Aortic Atherosclerosis (ICD10-I70.0). Electronically Signed   By: Margarette Canada M.D.   On: 08/22/2019 12:22    Procedures Procedures (including critical care time)  Medications Ordered in ED Medications  fentaNYL (SUBLIMAZE) injection 50 mcg (has no administration in time range)  acetaminophen (TYLENOL) tablet 1,000 mg (1,000 mg Oral Given 08/22/19 1006)  iohexol (OMNIPAQUE) 300 MG/ML solution 100 mL (100 mLs Intravenous Contrast Given 08/22/19 1143)  potassium chloride SA (KLOR-CON) CR tablet 40 mEq (40 mEq Oral Given 08/22/19 1215)    ED Course  I have reviewed the triage vital signs and the nursing notes.  Pertinent labs & imaging results that were available during my care of the patient were reviewed by me and considered in my medical decision making (see chart for details).    MDM Rules/Calculators/A&P                      Patient presents with lower abdominal and pelvic discomfort for the past few days.  Patient has no signs of significant infection no guarding on exam.  Discussed differential diagnosis and plan to check urine for sign of urine infection, screening blood work to check for pancreatitis or sign of infection or anemia, pain meds ordered.  Blood work reviewed showing potassium 3.2, liver function 75/213.  Oral potassium given.  Pain improved in the ER.  CT scan ordered for further delineation and showed small right liver lesion that needs outpatient follow-up and MRI discussed with patient.  Likely fibroids in the uterus.  Otherwise no acute findings and patient stable for outpatient follow-up.  Urine culture sent.  Normal white blood cell count, hemoglobin 10.9.  Final Clinical Impression(s) / ED  Diagnoses Final diagnoses:  Lower abdominal pain  Hypokalemia  LFT elevation  Liver lesion  Intramural leiomyoma of uterus    Rx / DC  Orders ED Discharge Orders    None       Elnora Morrison, MD 08/22/19 1255

## 2019-08-22 NOTE — ED Notes (Signed)
ED Provider at bedside. 

## 2019-08-22 NOTE — ED Notes (Signed)
Attempted IV X2 - Right AC and right hand - for blood unable to obtain. Will have another RN attempt with [place].

## 2019-08-23 LAB — URINE CULTURE

## 2019-08-27 MED FILL — JUNEL 1.5-30 TABLET: 1.5-30 | 21 days supply | Qty: 21 | Fill #1

## 2019-09-24 MED FILL — JUNEL 1.5-30 TABLET: 1.5-30 | 21 days supply | Qty: 21 | Fill #2

## 2019-09-24 MED FILL — HYDROCHLOROTHIAZIDE 25 MG T: 25 | 90 days supply | Qty: 90 | Fill #1

## 2019-10-08 MED FILL — VALACYCLOVIR HCL 500 MG TAB: 500 | 30 days supply | Qty: 30 | Fill #4

## 2019-10-29 MED FILL — SIMVASTATIN 40 MG TABS: 40 | 90 days supply | Qty: 90 | Fill #1

## 2019-10-29 MED FILL — JUNEL 1.5-30 TABLET: 1.5-30 | 21 days supply | Qty: 21 | Fill #3

## 2019-11-02 MED FILL — METOPROLOL SUCCINATE ER 25: 25 | 90 days supply | Qty: 90 | Fill #1

## 2019-11-02 MED FILL — PANTOPRAZOLE SOD DR 20 MG T: 20 | 90 days supply | Qty: 90 | Fill #1

## 2019-11-03 MED FILL — VALACYCLOVIR HCL 500 MG TAB: 500 | 30 days supply | Qty: 30 | Fill #5

## 2019-11-22 MED FILL — METOPROLOL SUCCINATE ER 100: 100 | 90 days supply | Qty: 90 | Fill #2

## 2019-11-22 MED FILL — CITALOPRAM HBR 40 MG TABLET: 40 | 90 days supply | Qty: 90 | Fill #2

## 2019-11-22 MED FILL — JUNEL 1.5-30 TABLET: 1.5-30 | 21 days supply | Qty: 21 | Fill #4

## 2019-12-08 MED FILL — VALACYCLOVIR HCL 500 MG TAB: 500 | 30 days supply | Qty: 30 | Fill #6

## 2019-12-31 MED FILL — POTASSIUM CHLORIDE CRYS ER: 20 | 90 days supply | Qty: 90 | Fill #1

## 2019-12-31 MED FILL — HYDROCHLOROTHIAZIDE 25 MG T: 25 | 90 days supply | Qty: 90 | Fill #2

## 2019-12-31 MED FILL — JUNEL 1.5-30 TABLET: 1.5-30 | 21 days supply | Qty: 21 | Fill #5

## 2020-01-03 ENCOUNTER — Emergency Department (HOSPITAL_BASED_OUTPATIENT_CLINIC_OR_DEPARTMENT_OTHER)
Admission: EM | Admit: 2020-01-03 | Discharge: 2020-01-03 | Disposition: A | Discharge status: Home / Self Care | Payer: BLUE CROSS/BLUE SHIELD | Attending: Emergency Medicine | Admitting: Emergency Medicine

## 2020-01-03 ENCOUNTER — Other Ambulatory Visit: Payer: Self-pay

## 2020-01-03 ENCOUNTER — Other Ambulatory Visit (HOSPITAL_BASED_OUTPATIENT_CLINIC_OR_DEPARTMENT_OTHER): Payer: Self-pay | Admitting: Emergency Medicine

## 2020-01-03 ENCOUNTER — Encounter (HOSPITAL_BASED_OUTPATIENT_CLINIC_OR_DEPARTMENT_OTHER): Payer: Self-pay | Admitting: Emergency Medicine

## 2020-01-03 DIAGNOSIS — Z79899 Other long term (current) drug therapy: Secondary | ICD-10-CM | POA: Diagnosis not present

## 2020-01-03 DIAGNOSIS — Z20822 Contact with and (suspected) exposure to covid-19: Secondary | ICD-10-CM | POA: Diagnosis not present

## 2020-01-03 DIAGNOSIS — J0100 Acute maxillary sinusitis, unspecified: Secondary | ICD-10-CM | POA: Diagnosis not present

## 2020-01-03 DIAGNOSIS — H9201 Otalgia, right ear: Secondary | ICD-10-CM | POA: Insufficient documentation

## 2020-01-03 DIAGNOSIS — J0110 Acute frontal sinusitis, unspecified: Secondary | ICD-10-CM | POA: Insufficient documentation

## 2020-01-03 DIAGNOSIS — Z7982 Long term (current) use of aspirin: Secondary | ICD-10-CM | POA: Insufficient documentation

## 2020-01-03 DIAGNOSIS — I10 Essential (primary) hypertension: Secondary | ICD-10-CM | POA: Diagnosis not present

## 2020-01-03 DIAGNOSIS — Z7951 Long term (current) use of inhaled steroids: Secondary | ICD-10-CM | POA: Diagnosis not present

## 2020-01-03 DIAGNOSIS — J069 Acute upper respiratory infection, unspecified: Secondary | ICD-10-CM

## 2020-01-03 DIAGNOSIS — J45909 Unspecified asthma, uncomplicated: Secondary | ICD-10-CM | POA: Insufficient documentation

## 2020-01-03 DIAGNOSIS — R059 Cough, unspecified: Secondary | ICD-10-CM | POA: Diagnosis present

## 2020-01-03 LAB — RESPIRATORY PANEL BY RT PCR (FLU A&B, COVID)
Influenza A by PCR: NEGATIVE
Influenza B by PCR: NEGATIVE
SARS Coronavirus 2 by RT PCR: NEGATIVE

## 2020-01-03 MED ORDER — FLUTICASONE PROPIONATE 50 MCG/ACT NA SUSP
1.0000 | Freq: Every day | NASAL | 2 refills | Status: DC
Start: 2020-01-03 — End: 2020-01-03

## 2020-01-03 MED ORDER — BENZONATATE 200 MG PO CAPS
200.0000 mg | ORAL_CAPSULE | Freq: Three times a day (TID) | ORAL | 0 refills | Status: DC
Start: 2020-01-03 — End: 2020-01-03

## 2020-01-03 MED ORDER — CETIRIZINE HCL 10 MG PO TABS
10.0000 mg | ORAL_TABLET | Freq: Every day | ORAL | 0 refills | Status: DC
Start: 2020-01-03 — End: 2020-01-03

## 2020-01-03 MED FILL — BENZONATATE 200 MG CAPS: 200 | 10 days supply | Qty: 30 | Fill #0

## 2020-01-03 MED FILL — CETIRIZINE HCL 10 MG TABS: 10 | 100 days supply | Qty: 100 | Fill #0

## 2020-01-03 MED FILL — FLUTICASONE PROP 50 MCG SPR: 50 | 60 days supply | Qty: 16 | Fill #0

## 2020-01-03 NOTE — ED Triage Notes (Signed)
Right earache x 2 days , sinus pressure , nasal congestion, headache. Dry cough.

## 2020-01-03 NOTE — ED Provider Notes (Signed)
[facility] EMERGENCY DEPARTMENT Provider Note   CSN: [id number] Arrival date & time: 01/03/20  1030     History Chief Complaint  Patient presents with   Otalgia    right    Katherine Wiggins is a 54 y.o. female.  54 year old female presents with complaint of right ear pain with generalized sinus congestion, worse on the right and nonproductive cough.  Patient states her symptoms started 2 days ago, not improving with home OTC treatments.  Patient was vaccinated against NOMVE-72 in March with Moderna.  No known sick contacts.  No other complaints or concerns.  Katherine Wiggins was evaluated in Emergency Department on 01/03/2020 for the symptoms described in the history of present illness. She was evaluated in the context of the global COVID-19 pandemic, which necessitated consideration that the patient might be at risk for infection with the SARS-CoV-2 virus that causes COVID-19. Institutional protocols and algorithms that pertain to the evaluation of patients at risk for COVID-19 are in a state of rapid change based on information released by regulatory bodies including the CDC and federal and state organizations. These policies and algorithms were followed during the patient's care in the ED.         Past Medical History:  Diagnosis Date   Asthma    Depression    Endometriosis    Fibromyalgia    High cholesterol    History of salpingectomy    Right   Hypertension    RA (rheumatoid arthritis) ([place])     Patient Active Problem List   Diagnosis Date Noted   Hypertension, essential 08/05/2018   Elevated cholesterol 08/05/2018   Rheumatoid arthritis ([place]) 08/05/2018   Abnormal uterine bleeding (AUB) 04/20/2018    Past Surgical History:  Procedure Laterality Date   CERVICAL SPINE SURGERY     RIGHT OOPHORECTOMY Right 1995   SALPINGECTOMY Right 1995   TUBAL LIGATION       OB History    Gravida  5   Para  3   Term  2   Preterm  1    AB  2   Living  3     SAB  2   TAB      Ectopic      Multiple      Live Births  3           Family History  Problem Relation Age of Onset   Diabetes Mother    Hypertension Mother    Diabetes Sister    Hypertension Sister    Hypertension Brother     Social History   Tobacco Use   Smoking status: Never Smoker   Smokeless tobacco: Never Used  Substance Use Topics   Alcohol use: No   Drug use: No    Home Medications Prior to Admission medications   Medication Sig Start Date End Date Taking? Authorizing Provider  amitriptyline (ELAVIL) 25 MG tablet Take 25 mg by mouth at bedtime.    [provider]  aspirin 81 MG tablet Take 81 mg by mouth daily.    [provider]  benzonatate (TESSALON) 200 MG capsule Take 1 capsule (200 mg total) by mouth every 8 (eight) hours for 10 days. 01/03/20 01/13/20  Tacy Learn, PA-C  cetirizine (ZYRTEC ALLERGY) 10 MG tablet Take 1 tablet (10 mg total) by mouth daily. 01/03/20   Tacy Learn, PA-C  citalopram (CELEXA) 40 MG tablet Take 40 mg by mouth daily.    [provider]  fluticasone (FLONASE) 50 MCG/ACT nasal spray Place 1 spray into both nostrils daily. 01/03/20   Tacy Learn, PA-C  hydrochlorothiazide (HYDRODIURIL) 25 MG tablet Take by mouth. 05/27/18   [provider]  metoprolol tartrate (LOPRESSOR) 100 MG tablet Take 100 mg by mouth 2 (two) times daily.    [provider]  Norethindrone Acetate-Ethinyl Estrad-FE (LOESTRIN 24 FE) 1-20 MG-MCG(24) tablet Take 1 tablet by mouth daily. Patient not taking: Reported on 10/12/2018 04/20/18   Lavonia Drafts, MD  Norethindrone Acetate-Ethinyl Estradiol (LARIN 1.5/30) 1.5-30 MG-MCG tablet Take 1 tablet by mouth daily. 07/30/19   Lavonia Drafts, MD  pantoprazole (PROTONIX) 20 MG tablet Take 20 mg by mouth daily.    [provider]  potassium chloride SA (KLOR-CON) 20 MEQ tablet Take by mouth. 05/27/18    [provider]  ranitidine (ZANTAC) 150 MG capsule Take 150 mg by mouth every evening.    [provider]  simvastatin (ZOCOR) 40 MG tablet Take 40 mg by mouth at bedtime.      [provider]    Allergies    Codeine, Nitrofurantoin, Sulfamethoxazole-trimethoprim, and Septra [sulfamethoxazole w/trimethoprim (co-trimoxazole)]  Review of Systems   Review of Systems  Constitutional: Negative for fever.  HENT: Positive for congestion, ear pain, sinus pressure and sinus pain. Negative for sneezing and sore throat.   Respiratory: Positive for cough.   Gastrointestinal: Negative for nausea and vomiting.  Musculoskeletal: Negative for arthralgias and myalgias.  Skin: Negative for rash and wound.  Allergic/Immunologic: Negative for immunocompromised state.  Neurological: Negative for weakness.  Hematological: Negative for adenopathy.  All other systems reviewed and are negative.   Physical Exam Updated Vital Signs BP 133/77    Pulse (!) 57    Temp 98.8 F (37.1 C) (Oral)    Resp 18    Ht 5\' 3"  (1.6 m)    Wt 83.5 kg    LMP 12/08/2019    SpO2 100%    BMI 32.59 kg/m   Physical Exam Vitals and nursing note reviewed.  Constitutional:      General: She is not in acute distress.    Appearance: She is well-developed. She is not diaphoretic.  HENT:     Head: Normocephalic and atraumatic.     Jaw: No trismus.     Right Ear: Ear canal normal. A middle ear effusion is present. Tympanic membrane is not injected, erythematous or bulging.     Left Ear: Tympanic membrane and ear canal normal.     Nose: Congestion present.     Right Sinus: Maxillary sinus tenderness and frontal sinus tenderness present.     Left Sinus: Maxillary sinus tenderness and frontal sinus tenderness present.     Mouth/Throat:     Mouth: Mucous membranes are moist.  Eyes:     Conjunctiva/sclera: Conjunctivae normal.  Cardiovascular:     Rate and Rhythm: Normal rate and regular rhythm.      Pulses: Normal pulses.     Heart sounds: Normal heart sounds.  Pulmonary:     Effort: Pulmonary effort is normal.     Breath sounds: Normal breath sounds.  Musculoskeletal:     Cervical back: Neck supple. No tenderness.  Lymphadenopathy:     Cervical: No cervical adenopathy.  Skin:    General: Skin is warm and dry.     Findings: No erythema or rash.  Neurological:     Mental Status: She is alert and oriented to person, place, and time.  Psychiatric:  Behavior: Behavior normal.     ED Results / Procedures / Treatments   Labs (all labs ordered are listed, but only abnormal results are displayed) Labs Reviewed  RESPIRATORY PANEL BY RT PCR (FLU A&B, COVID)    EKG None  Radiology No results found.  Procedures Procedures (including critical care time)  Medications Ordered in ED Medications - No data to display  ED Course  I have reviewed the triage vital signs and the nursing notes.  Pertinent labs & imaging results that were available during my care of the patient were reviewed by me and considered in my medical decision making (see chart for details).  Clinical Course as of Jan 03 1139  Mon Jan 03, 5583  7425 54 year old female with complaint of sinus congestion and ear pain onset 2 days ago.  On exam, found to have diffuse sinus tenderness, boggy nasal membranes, right ear effusion. Discussed likely viral origin, given the patient is Covid vaccinated, recommend Covid test. Also recommend Zyrtec, Flonase, Coricidin.  Will give Tessalon as needed for her cough.   [LM]    Clinical Course User Index [LM] Roque Lias   MDM Rules/Calculators/A&P                          Final Clinical Impression(s) / ED Diagnoses Final diagnoses:  Viral upper respiratory tract infection    Rx / DC Orders ED Discharge Orders         Ordered    fluticasone (FLONASE) 50 MCG/ACT nasal spray  Daily        01/03/20 1137    cetirizine (ZYRTEC ALLERGY) 10 MG tablet   Daily        01/03/20 1137    benzonatate (TESSALON) 200 MG capsule  Every 8 hours        01/03/20 1137           Tacy Learn, PA-C 01/03/20 1140    Malvin Johns, MD 01/03/20 1417

## 2020-01-03 NOTE — Discharge Instructions (Addendum)
Motrin and Tylenol as needed as directed for pain. Saline sinus rinse twice daily followed by Flonase- use twice daily for the first 5 days then continue with daily use. Zyrtec daily. Tessalon as needed for cough as prescribed. Coricidin HBP as needed as directed if additional symptom relief is needed.  Follow up in your mychart for your COVID test results.

## 2020-01-13 MED FILL — VALACYCLOVIR HCL 500 MG TAB: 500 | 30 days supply | Qty: 30 | Fill #7

## 2020-02-04 ENCOUNTER — Other Ambulatory Visit (HOSPITAL_BASED_OUTPATIENT_CLINIC_OR_DEPARTMENT_OTHER): Payer: Self-pay | Admitting: Internal Medicine

## 2020-02-04 MED FILL — PANTOPRAZOLE SOD DR 20 MG T: 20 | 90 days supply | Qty: 90 | Fill #0

## 2020-02-09 ENCOUNTER — Other Ambulatory Visit (HOSPITAL_BASED_OUTPATIENT_CLINIC_OR_DEPARTMENT_OTHER): Payer: Self-pay | Admitting: Internal Medicine

## 2020-02-09 MED FILL — METOPROLOL SUCCINATE ER 25: 25 | 90 days supply | Qty: 90 | Fill #0

## 2020-02-14 MED FILL — VALACYCLOVIR HCL 500 MG TAB: 500 | 30 days supply | Qty: 30 | Fill #8

## 2020-02-25 MED FILL — CITALOPRAM HBR 40 MG TABLET: 40 | 90 days supply | Qty: 90 | Fill #3

## 2020-02-25 MED FILL — METOPROLOL SUCCINATE ER 100: 100 | 90 days supply | Qty: 90 | Fill #3

## 2020-02-25 MED FILL — JUNEL 1.5-30 TABLET: 1.5-30 | 21 days supply | Qty: 21 | Fill #6

## 2020-03-21 MED FILL — VALACYCLOVIR HCL 500 MG TAB: 500 | 30 days supply | Qty: 30 | Fill #9

## 2020-03-21 MED FILL — FLUTICASONE PROP 50 MCG SPR: 50 | 60 days supply | Qty: 16 | Fill #1

## 2020-03-21 MED FILL — JUNEL 1.5-30 TABLET: 1.5-30 | 21 days supply | Qty: 21 | Fill #7

## 2020-03-21 MED FILL — POTASSIUM CHLORIDE CRYS ER: 20 | 90 days supply | Qty: 90 | Fill #2

## 2020-03-21 MED FILL — HYDROCHLOROTHIAZIDE 25 MG T: 25 | 90 days supply | Qty: 90 | Fill #3

## 2020-03-29 MED FILL — SIMVASTATIN 40 MG TABS: 40 | 30 days supply | Qty: 30 | Fill #2

## 2020-04-03 ENCOUNTER — Emergency Department (HOSPITAL_BASED_OUTPATIENT_CLINIC_OR_DEPARTMENT_OTHER)
Admission: EM | Admit: 2020-04-03 | Discharge: 2020-04-03 | Disposition: A | Discharge status: Home / Self Care | Payer: BC Managed Care – PPO | Attending: Emergency Medicine | Admitting: Emergency Medicine

## 2020-04-03 ENCOUNTER — Other Ambulatory Visit (HOSPITAL_BASED_OUTPATIENT_CLINIC_OR_DEPARTMENT_OTHER): Payer: Self-pay | Admitting: Emergency Medicine

## 2020-04-03 ENCOUNTER — Other Ambulatory Visit: Payer: Self-pay

## 2020-04-03 ENCOUNTER — Encounter (HOSPITAL_BASED_OUTPATIENT_CLINIC_OR_DEPARTMENT_OTHER): Payer: Self-pay | Admitting: Emergency Medicine

## 2020-04-03 DIAGNOSIS — U071 COVID-19: Secondary | ICD-10-CM | POA: Insufficient documentation

## 2020-04-03 DIAGNOSIS — R059 Cough, unspecified: Secondary | ICD-10-CM | POA: Diagnosis present

## 2020-04-03 DIAGNOSIS — Z20822 Contact with and (suspected) exposure to covid-19: Secondary | ICD-10-CM

## 2020-04-03 DIAGNOSIS — J45909 Unspecified asthma, uncomplicated: Secondary | ICD-10-CM | POA: Diagnosis not present

## 2020-04-03 DIAGNOSIS — I10 Essential (primary) hypertension: Secondary | ICD-10-CM | POA: Insufficient documentation

## 2020-04-03 LAB — SARS CORONAVIRUS 2 (TAT 6-24 HRS): SARS Coronavirus 2: POSITIVE — AB

## 2020-04-03 MED ORDER — ALBUTEROL SULFATE HFA 108 (90 BASE) MCG/ACT IN AERS: 1.0000 | INHALATION_SPRAY | Freq: Four times a day (QID) | RESPIRATORY_TRACT | 0 refills | Status: DC | PRN

## 2020-04-03 MED ORDER — BENZONATATE 100 MG PO CAPS
100.0000 mg | ORAL_CAPSULE | Freq: Three times a day (TID) | ORAL | 0 refills | Status: DC | PRN
Start: 2020-04-03 — End: 2020-04-03

## 2020-04-03 MED FILL — ALBUTEROL SULFATE HFA 108 (: 108 (90 BAS | 25 days supply | Qty: 18 | Fill #0

## 2020-04-03 MED FILL — BENZONATATE 100 MG CAPS: 100 | 7 days supply | Qty: 21 | Fill #0

## 2020-04-03 NOTE — ED Provider Notes (Signed)
Emergency Department Provider Note   I have reviewed the triage vital signs and the nursing notes.   HISTORY  Chief Complaint URI   HPI Katherine Wiggins is a 55 y.o. female presents to the ED with sore throat, congestion, cough, and vomiting. No known sick contacts. No CP. No radiation of symptoms or modifying factors. No abdominal/back pain. No UTI symptoms. No flank pain. Notes a HA.    Past Medical History:  Diagnosis Date  . Asthma   . Depression   . Endometriosis   . Fibromyalgia   . High cholesterol   . History of salpingectomy    Right  . Hypertension   . RA (rheumatoid arthritis) [facility])     Patient Active Problem List   Diagnosis Date Noted  . Hypertension, essential 08/05/2018  . Elevated cholesterol 08/05/2018  . Rheumatoid arthritis ([place]) 08/05/2018  . Abnormal uterine bleeding (AUB) 04/20/2018    Past Surgical History:  Procedure Laterality Date  . CERVICAL SPINE SURGERY    . RIGHT OOPHORECTOMY Right 1995  . SALPINGECTOMY Right 1995  . TUBAL LIGATION      Allergies Codeine, Nitrofurantoin, Sulfamethoxazole-trimethoprim, and Septra [sulfamethoxazole w/trimethoprim (co-trimoxazole)]  Family History  Problem Relation Age of Onset  . Diabetes Mother   . Hypertension Mother   . Diabetes Sister   . Hypertension Sister   . Hypertension Brother     Social History Social History   Tobacco Use  . Smoking status: Never Smoker  . Smokeless tobacco: Never Used  Substance Use Topics  . Alcohol use: No  . Drug use: No    Review of Systems  Constitutional: Positive fever/chills Eyes: No visual changes. ENT: Positive sore throat. Cardiovascular: Denies chest pain. Respiratory: Denies shortness of breath. Positive cough.  Gastrointestinal: No abdominal pain.  No nausea, no vomiting. Mild diarrhea.  No constipation. Genitourinary: Negative for dysuria. Musculoskeletal: Negative for back pain. Skin: Negative for rash. Neurological: Negative  for focal weakness or numbness. Positive HA.   10-point ROS otherwise negative.  ____________________________________________   PHYSICAL EXAM:  VITAL SIGNS: ED Triage Vitals  Enc Vitals Group     BP 04/03/20 0823 128/77     Pulse Rate 04/03/20 0823 80     Resp 04/03/20 0823 20     Temp 04/03/20 0823 98.3 F (36.8 C)     Temp Source 04/03/20 0823 Oral     SpO2 04/03/20 0823 98 %     Weight 04/03/20 0824 180 lb (81.6 kg)     Height 04/03/20 0824 5\' 3"  (1.6 m)   Constitutional: Alert and oriented. Well appearing and in no acute distress. Eyes: Conjunctivae are normal. Head: Atraumatic. Nose: Positive congestion/rhinnorhea. Mouth/Throat: Mucous membranes are moist.  Oropharynx non-erythematous. No PTA or exudate.  Neck: No stridor. Cardiovascular: Normal rate, regular rhythm. Good peripheral circulation. Grossly normal heart sounds.   Respiratory: Normal respiratory effort.  No retractions. Lungs CTAB. Gastrointestinal: Soft and nontender. No distention.  Musculoskeletal: No gross deformities of extremities. Neurologic:  Normal speech and language.  Skin:  Skin is warm, dry and intact. No rash noted.  ____________________________________________   LABS (all labs ordered are listed, but only abnormal results are displayed)  Labs Reviewed  SARS CORONAVIRUS 2 (TAT 6-24 HRS) - Abnormal; Notable for the following components:      Result Value   SARS Coronavirus 2 POSITIVE (*)    All other components within normal limits   ____________________________________________   PROCEDURES  Procedure(s) performed:   Procedures  None  ____________________________________________   INITIAL IMPRESSION / ASSESSMENT AND PLAN / ED COURSE  Pertinent labs & imaging results that were available during my care of the patient were reviewed by me and considered in my medical decision making (see chart for details).   Patient presents to the ED with COVID like symptoms. No hypoxemia or  increased WOB. No CP to suspect ACS/PE. Plan for COVID PCR. Patient to quarantine and f/u results in [place].   Katherine Wiggins was evaluated in Emergency Department for the symptoms described in the history of present illness. She was evaluated in the context of the global COVID-19 pandemic, which necessitated consideration that the patient might be at risk for infection with the SARS-CoV-2 virus that causes COVID-19. Institutional protocols and algorithms that pertain to the evaluation of patients at risk for COVID-19 are in a state of rapid change based on information released by regulatory bodies including the CDC and federal and state organizations. These policies and algorithms were followed during the patient's care in the ED.   ____________________________________________  FINAL CLINICAL IMPRESSION(S) / ED DIAGNOSES  Final diagnoses:  Suspected COVID-19 virus infection    NEW OUTPATIENT MEDICATIONS STARTED DURING THIS VISIT:  Discharge Medication List as of 04/03/2020  9:38 AM    START taking these medications   Details  albuterol (VENTOLIN HFA) 108 (90 Base) MCG/ACT inhaler Inhale 1-2 puffs into the lungs every 6 (six) hours as needed for wheezing or shortness of breath., Starting Mon 04/03/2020, Normal    benzonatate (TESSALON) 100 MG capsule Take 1 capsule (100 mg total) by mouth 3 (three) times daily as needed for cough., Starting Mon 04/03/2020, Normal        Note:  This document was prepared using Dragon voice recognition software and may include unintentional dictation errors.  Nanda Quinton, MD, [facility] Emergency Medicine    Camaria Gerald, Wonda Olds, MD 04/06/20 1011

## 2020-04-03 NOTE — Discharge Instructions (Signed)
You were seen in the emergency room today with COVID-19 infection symptoms.  Please remain in quarantine for the next 5 days.  I am testing you for COVID and the test results will come back in the next 6-24 hours.  I have called in several medications to help with symptoms to the pharmacy.  You can follow the test results in the MyChart app on your phone.

## 2020-04-03 NOTE — ED Triage Notes (Signed)
Sinus area headache, stuffy nose, congestion, dry cough, sore throat since Saturday.Marland Kitchen    No change in taste or smell.  No known fever.

## 2020-04-21 MED FILL — VALACYCLOVIR HCL 500 MG TAB: 500 | 30 days supply | Qty: 30 | Fill #10

## 2020-05-02 ENCOUNTER — Other Ambulatory Visit (HOSPITAL_BASED_OUTPATIENT_CLINIC_OR_DEPARTMENT_OTHER): Payer: Self-pay | Admitting: Internal Medicine

## 2020-05-02 MED FILL — SIMVASTATIN 40 MG TABS: 40 | 30 days supply | Qty: 30 | Fill #0

## 2020-05-02 MED FILL — AMITRIPTYLINE HCL 25 MG TAB: 25 | 30 days supply | Qty: 30 | Fill #0

## 2020-05-02 MED FILL — PANTOPRAZOLE SOD DR 20 MG T: 20 | 30 days supply | Qty: 30 | Fill #0

## 2020-05-23 MED FILL — METOPROLOL SUCCINATE ER 100: 100 | 30 days supply | Qty: 30 | Fill #0

## 2020-05-23 MED FILL — METOPROLOL SUCCINATE ER 25: 25 | 30 days supply | Qty: 30 | Fill #0

## 2020-05-29 MED FILL — VALACYCLOVIR HCL 500 MG TAB: 500 | 30 days supply | Qty: 30 | Fill #0

## 2020-06-03 ENCOUNTER — Emergency Department (HOSPITAL_BASED_OUTPATIENT_CLINIC_OR_DEPARTMENT_OTHER)
Admission: EM | Admit: 2020-06-03 | Discharge: 2020-06-03 | Disposition: A | Discharge status: Home / Self Care | Payer: BC Managed Care – PPO | Attending: Emergency Medicine | Admitting: Emergency Medicine

## 2020-06-03 ENCOUNTER — Encounter (HOSPITAL_BASED_OUTPATIENT_CLINIC_OR_DEPARTMENT_OTHER): Payer: Self-pay | Admitting: Emergency Medicine

## 2020-06-03 DIAGNOSIS — M62838 Other muscle spasm: Secondary | ICD-10-CM | POA: Diagnosis not present

## 2020-06-03 DIAGNOSIS — Z79899 Other long term (current) drug therapy: Secondary | ICD-10-CM | POA: Diagnosis not present

## 2020-06-03 DIAGNOSIS — M542 Cervicalgia: Secondary | ICD-10-CM | POA: Diagnosis present

## 2020-06-03 DIAGNOSIS — I10 Essential (primary) hypertension: Secondary | ICD-10-CM | POA: Insufficient documentation

## 2020-06-03 DIAGNOSIS — J45909 Unspecified asthma, uncomplicated: Secondary | ICD-10-CM | POA: Diagnosis not present

## 2020-06-03 DIAGNOSIS — Z7982 Long term (current) use of aspirin: Secondary | ICD-10-CM | POA: Insufficient documentation

## 2020-06-03 MED ORDER — METHOCARBAMOL 500 MG PO TABS
500.0000 mg | ORAL_TABLET | Freq: Three times a day (TID) | ORAL | 0 refills | Status: DC | PRN
Start: 2020-06-03 — End: 2021-04-02

## 2020-06-03 NOTE — ED Provider Notes (Signed)
[place] EMERGENCY DEPARTMENT Provider Note   CSN: [id number] Arrival date & time: 06/03/20  1238     History Chief Complaint  Patient presents with  . Neck Pain    Katherine Wiggins is a 55 y.o. female.  HPI Patient is a 55 year old female with medical history as noted below.  She presents the emergency department due to right sided neck pain.  She states she woke up with her symptoms about 3 days ago and it began worsening yesterday.  They worsen with movement of the right arm as well as lateral flexion of the neck.  She took a 500 mg Robaxin with mild relief.  Denies a history of similar symptoms.  Denies any recent injuries or falls.    Past Medical History:  Diagnosis Date  . Asthma   . Depression   . Endometriosis   . Fibromyalgia   . High cholesterol   . History of salpingectomy    Right  . Hypertension   . RA (rheumatoid arthritis) [facility])     Patient Active Problem List   Diagnosis Date Noted  . Hypertension, essential 08/05/2018  . Elevated cholesterol 08/05/2018  . Rheumatoid arthritis ([place]) 08/05/2018  . Abnormal uterine bleeding (AUB) 04/20/2018    Past Surgical History:  Procedure Laterality Date  . CERVICAL SPINE SURGERY    . RIGHT OOPHORECTOMY Right 1995  . SALPINGECTOMY Right 1995  . TUBAL LIGATION       OB History    Gravida  5   Para  3   Term  2   Preterm  1   AB  2   Living  3     SAB  2   IAB      Ectopic      Multiple      Live Births  3           Family History  Problem Relation Age of Onset  . Diabetes Mother   . Hypertension Mother   . Diabetes Sister   . Hypertension Sister   . Hypertension Brother     Social History   Tobacco Use  . Smoking status: Never Smoker  . Smokeless tobacco: Never Used  Substance Use Topics  . Alcohol use: No  . Drug use: No    Home Medications Prior to Admission medications   Medication Sig Start Date End Date Taking? Authorizing Provider  methocarbamol  (ROBAXIN) 500 MG tablet Take 1 tablet (500 mg total) by mouth every 8 (eight) hours as needed for muscle spasms. 06/03/20  Yes Rayna Sexton, PA-C  albuterol (VENTOLIN HFA) 108 (90 Base) MCG/ACT inhaler Inhale 1-2 puffs into the lungs every 6 (six) hours as needed for wheezing or shortness of breath. 04/03/20   Long, Wonda Olds, MD  amitriptyline (ELAVIL) 25 MG tablet Take 25 mg by mouth at bedtime.    [provider]  aspirin 81 MG tablet Take 81 mg by mouth daily.    [provider]  benzonatate (TESSALON) 100 MG capsule Take 1 capsule (100 mg total) by mouth 3 (three) times daily as needed for cough. 04/03/20   Long, Wonda Olds, MD  cetirizine (ZYRTEC ALLERGY) 10 MG tablet Take 1 tablet (10 mg total) by mouth daily. 01/03/20   Tacy Learn, PA-C  citalopram (CELEXA) 40 MG tablet Take 40 mg by mouth daily.    [provider]  fluticasone (FLONASE) 50 MCG/ACT nasal spray Place 1 spray into both nostrils daily. 01/03/20   Percell Miller,  Hewitt Shorts, PA-C  hydrochlorothiazide (HYDRODIURIL) 25 MG tablet Take by mouth. 05/27/18   [provider]  metoprolol tartrate (LOPRESSOR) 100 MG tablet Take 100 mg by mouth 2 (two) times daily.    [provider]  Norethindrone Acetate-Ethinyl Estrad-FE (LOESTRIN 24 FE) 1-20 MG-MCG(24) tablet Take 1 tablet by mouth daily. Patient not taking: Reported on 10/12/2018 04/20/18   Lavonia Drafts, MD  Norethindrone Acetate-Ethinyl Estradiol (LARIN 1.5/30) 1.5-30 MG-MCG tablet Take 1 tablet by mouth daily. 07/30/19   Lavonia Drafts, MD  pantoprazole (PROTONIX) 20 MG tablet Take 20 mg by mouth daily.    [provider]  potassium chloride SA (KLOR-CON) 20 MEQ tablet Take by mouth. 05/27/18   [provider]  ranitidine (ZANTAC) 150 MG capsule Take 150 mg by mouth every evening.    [provider]  simvastatin (ZOCOR) 40 MG tablet Take 40 mg by mouth at bedtime.      [provider]     Allergies    Codeine, Nitrofurantoin, Sulfamethoxazole-trimethoprim, and Septra [sulfamethoxazole w/trimethoprim (co-trimoxazole)]  Review of Systems   Review of Systems  Musculoskeletal: Positive for myalgias. Negative for arthralgias and joint swelling.  Skin: Negative for wound.  Neurological: Negative for weakness and numbness.   Physical Exam Updated Vital Signs BP 118/74 (BP Location: Left Arm)   Pulse 73   Temp 98.6 F (37 C) (Oral)   Resp 18   Ht 5\' 3"  (1.6 m)   Wt 82.6 kg   SpO2 100%   BMI 32.24 kg/m   Physical Exam Vitals and nursing note reviewed.  Constitutional:      General: She is not in acute distress.    Appearance: She is well-developed.  HENT:     Head: Normocephalic and atraumatic.     Right Ear: External ear normal.     Left Ear: External ear normal.  Eyes:     General: No scleral icterus.       Right eye: No discharge.        Left eye: No discharge.     Conjunctiva/sclera: Conjunctivae normal.  Neck:     Trachea: No tracheal deviation.  Cardiovascular:     Rate and Rhythm: Normal rate.  Pulmonary:     Effort: Pulmonary effort is normal. No respiratory distress.     Breath sounds: No stridor.  Abdominal:     General: There is no distension.  Musculoskeletal:        General: Tenderness present. No swelling or deformity.     Cervical back: Neck supple.     Comments: Moderate TTP as well as a palpable muscle spasm noted along the right trapezius.  Full range of motion of the bilateral upper extremities.  No midline spine pain.  Skin:    General: Skin is warm and dry.     Findings: No rash.  Neurological:     Mental Status: She is alert.     Cranial Nerves: Cranial nerve deficit: no gross deficits.    ED Results / Procedures / Treatments   Labs (all labs ordered are listed, but only abnormal results are displayed) Labs Reviewed - No data to display  EKG None  Radiology No results found.  Procedures Procedures   Medications  Ordered in ED Medications - No data to display  ED Course  I have reviewed the triage vital signs and the nursing notes.  Pertinent labs & imaging results that were available during my care of the patient were reviewed by me and  considered in my medical decision making (see chart for details).    MDM Rules/Calculators/A&P                          Patient is a 54 year old female who awoke with right-sided neck pain about 3 days ago.  She has a large palpable muscle spasm along the right trapezius.  No midline spine pain.  No gross deficits on my exam.  No red flags.  Will d/c pt on a course of robaxin. We discussed safety regarding this medication.  Recommended Tylenol and ibuprofen for pain management throughout the day.  We discussed dosing.  Recommended return to the emergency department if her symptoms worsen.  Otherwise, recommend she follow-up with her PCP next week if her symptoms do not improve.  Her questions were answered and she was amicable at the time of discharge.  Final Clinical Impression(s) / ED Diagnoses Final diagnoses:  Muscle spasms of neck   Rx / DC Orders ED Discharge Orders         Ordered    methocarbamol (ROBAXIN) 500 MG tablet  Every 8 hours PRN        06/03/20 1510           Rayna Sexton, PA-C 06/03/20 1513    Margette Fast, MD 06/04/20 [phone]

## 2020-06-03 NOTE — Discharge Instructions (Addendum)
I recommend a combination of tylenol and ibuprofen for management of your pain. You can take a low dose of both at the same time. I recommend 325 mg of Tylenol combined with 400 mg of ibuprofen. This is one regular Tylenol and two regular ibuprofen. You can take these 2-3 times for day for your pain. Please try to take these medications with a small amount of food as well to prevent upsetting your stomach.  I am prescribing you a strong muscle relaxer called robaxin. Please only take this medication as needed for your pain up to three times per day. This medication can make you quite drowsy. Do not mix it with alcohol. Do not drive a vehicle after taking it.   If your symptoms worsen, please return to the emergency department.  Otherwise, please follow-up with your regular doctor next week.  It was a pleasure to meet you.

## 2020-06-03 NOTE — ED Triage Notes (Signed)
Woke up Wednesday with R side neck pain radiating into the shoulder.

## 2020-06-05 ENCOUNTER — Emergency Department (HOSPITAL_BASED_OUTPATIENT_CLINIC_OR_DEPARTMENT_OTHER)
Admission: EM | Admit: 2020-06-05 | Discharge: 2020-06-05 | Disposition: A | Discharge status: Home / Self Care | Payer: BC Managed Care – PPO | Attending: Emergency Medicine | Admitting: Emergency Medicine

## 2020-06-05 ENCOUNTER — Encounter (HOSPITAL_BASED_OUTPATIENT_CLINIC_OR_DEPARTMENT_OTHER): Payer: Self-pay | Admitting: Emergency Medicine

## 2020-06-05 ENCOUNTER — Other Ambulatory Visit (HOSPITAL_BASED_OUTPATIENT_CLINIC_OR_DEPARTMENT_OTHER): Payer: Self-pay | Admitting: Emergency Medicine

## 2020-06-05 ENCOUNTER — Other Ambulatory Visit: Payer: Self-pay

## 2020-06-05 DIAGNOSIS — Z79899 Other long term (current) drug therapy: Secondary | ICD-10-CM | POA: Insufficient documentation

## 2020-06-05 DIAGNOSIS — M5412 Radiculopathy, cervical region: Secondary | ICD-10-CM | POA: Diagnosis not present

## 2020-06-05 DIAGNOSIS — J45909 Unspecified asthma, uncomplicated: Secondary | ICD-10-CM | POA: Insufficient documentation

## 2020-06-05 DIAGNOSIS — M5410 Radiculopathy, site unspecified: Secondary | ICD-10-CM

## 2020-06-05 DIAGNOSIS — Z7982 Long term (current) use of aspirin: Secondary | ICD-10-CM | POA: Insufficient documentation

## 2020-06-05 DIAGNOSIS — M542 Cervicalgia: Secondary | ICD-10-CM

## 2020-06-05 MED ORDER — KETOROLAC TROMETHAMINE 30 MG/ML IJ SOLN
30.0000 mg | Freq: Once | INTRAMUSCULAR | Status: AC
  Administered 2020-06-05: 30 mg via INTRAMUSCULAR
  Filled 2020-06-05: qty 1

## 2020-06-05 MED ORDER — DICLOFENAC SODIUM 1 % EX GEL: 2.0000 g | Freq: Four times a day (QID) | CUTANEOUS | 0 refills | Status: DC

## 2020-06-05 MED ORDER — PREDNISONE 10 MG PO TABS
40.0000 mg | ORAL_TABLET | Freq: Every day | ORAL | 0 refills | Status: DC
Start: 2020-06-05 — End: 2020-06-05

## 2020-06-05 NOTE — Discharge Instructions (Addendum)
Please continue take your muscle relaxer Robaxin.  Additionally use Tylenol and ibuprofen as discussed below.   I am adding on prednisone for you to use as well.  Please follow-up with your primary care doctor.  Please use Tylenol or ibuprofen for pain.  You may use 600 mg ibuprofen every 6 hours or 1000 mg of Tylenol every 6 hours.  You may choose to alternate between the 2.  This would be most effective.  Not to exceed 4 g of Tylenol within 24 hours.  Not to exceed 3200 mg ibuprofen 24 hours.  I also strongly recommend using voltaren gel on the areas that are causing pain.

## 2020-06-05 NOTE — ED Triage Notes (Signed)
Pt continues to have right sided neck and shoulder pain.  Pt started to have dizziness yesterday.

## 2020-06-05 NOTE — ED Provider Notes (Signed)
[place] EMERGENCY DEPARTMENT Provider Note   CSN: [id number] Arrival date & time: 06/05/20  5188     History Chief Complaint  Patient presents with   Neck Pain   Shoulder Pain   Dizziness    Katherine Wiggins is a 55 y.o. female.  HPI Patient is a 54 year old female with past medical history significant for HTN, arthritis, HLD, fibromyalgia, depression, asthma  Patient presented today with continued right-sided neck pain and right shoulder pain.  She states that the pain has been ongoing for some approximately 1 week.  She states that she was seen in the past couple days and given a muscle relaxer Robaxin however she states that this is not significantly proved her symptoms and she felt somewhat lightheaded when she took it.  She denies any chest pain or shortness of breath no nausea or vomiting.  She states that she has had a bulging disc in her cervical spine in the past and was seen by a spine surgeon for this she.  She states that this feels similar.  She denies any weakness or notes in her extremities.  No difficulty walking and no visual symptoms or significant headache.  No other associated symptoms.  She has been using Tylenol and ibuprofen for pain.  As well as Robaxin that she was prescribed at last visit 2 days ago.     Past Medical History:  Diagnosis Date   Asthma    Depression    Endometriosis    Fibromyalgia    High cholesterol    History of salpingectomy    Right   Hypertension    RA (rheumatoid arthritis) ([place])     Patient Active Problem List   Diagnosis Date Noted   Hypertension, essential 08/05/2018   Elevated cholesterol 08/05/2018   Rheumatoid arthritis ([place]) 08/05/2018   Abnormal uterine bleeding (AUB) 04/20/2018    Past Surgical History:  Procedure Laterality Date   CERVICAL SPINE SURGERY     RIGHT OOPHORECTOMY Right 1995   SALPINGECTOMY Right 1995   TUBAL LIGATION       OB History    Gravida  5    Para  3   Term  2   Preterm  1   AB  2   Living  3     SAB  2   IAB      Ectopic      Multiple      Live Births  3           Family History  Problem Relation Age of Onset   Diabetes Mother    Hypertension Mother    Diabetes Sister    Hypertension Sister    Hypertension Brother     Social History   Tobacco Use   Smoking status: Never Smoker   Smokeless tobacco: Never Used  Substance Use Topics   Alcohol use: No   Drug use: No    Home Medications Prior to Admission medications   Medication Sig Start Date End Date Taking? Authorizing Provider  diclofenac Sodium (VOLTAREN) 1 % GEL Apply 2 g topically 4 (four) times daily for 6 days. 06/05/20 06/11/20 Yes Naiyah Klostermann S, PA  predniSONE (DELTASONE) 10 MG tablet Take 4 tablets (40 mg total) by mouth daily for 4 days. 06/05/20 06/09/20 Yes Nathian Stencil S, PA  albuterol (VENTOLIN HFA) 108 (90 Base) MCG/ACT inhaler Inhale 1-2 puffs into the lungs every 6 (six) hours as needed for wheezing or shortness of breath. 04/03/20  Long, Wonda Olds, MD  amitriptyline (ELAVIL) 25 MG tablet Take 25 mg by mouth at bedtime.    [provider]  aspirin 81 MG tablet Take 81 mg by mouth daily.    [provider]  benzonatate (TESSALON) 100 MG capsule Take 1 capsule (100 mg total) by mouth 3 (three) times daily as needed for cough. 04/03/20   Long, Wonda Olds, MD  cetirizine (ZYRTEC ALLERGY) 10 MG tablet Take 1 tablet (10 mg total) by mouth daily. 01/03/20   Tacy Learn, PA-C  citalopram (CELEXA) 40 MG tablet Take 40 mg by mouth daily.    [provider]  fluticasone (FLONASE) 50 MCG/ACT nasal spray Place 1 spray into both nostrils daily. 01/03/20   Tacy Learn, PA-C  hydrochlorothiazide (HYDRODIURIL) 25 MG tablet Take by mouth. 05/27/18   [provider]  methocarbamol (ROBAXIN) 500 MG tablet Take 1 tablet (500 mg total) by mouth every 8 (eight) hours as needed for muscle spasms. 06/03/20    Rayna Sexton, PA-C  metoprolol tartrate (LOPRESSOR) 100 MG tablet Take 100 mg by mouth 2 (two) times daily.    [provider]  Norethindrone Acetate-Ethinyl Estrad-FE (LOESTRIN 24 FE) 1-20 MG-MCG(24) tablet Take 1 tablet by mouth daily. Patient not taking: Reported on 10/12/2018 04/20/18   Lavonia Drafts, MD  Norethindrone Acetate-Ethinyl Estradiol (LARIN 1.5/30) 1.5-30 MG-MCG tablet Take 1 tablet by mouth daily. 07/30/19   Lavonia Drafts, MD  pantoprazole (PROTONIX) 20 MG tablet Take 20 mg by mouth daily.    [provider]  potassium chloride SA (KLOR-CON) 20 MEQ tablet Take by mouth. 05/27/18   [provider]  ranitidine (ZANTAC) 150 MG capsule Take 150 mg by mouth every evening.    [provider]  simvastatin (ZOCOR) 40 MG tablet Take 40 mg by mouth at bedtime.      [provider]    Allergies    Codeine, Nitrofurantoin, Sulfamethoxazole-trimethoprim, and Septra [sulfamethoxazole w/trimethoprim (co-trimoxazole)]  Review of Systems   Review of Systems  Constitutional: Negative for fever.  HENT: Negative for congestion.   Respiratory: Negative for shortness of breath.   Cardiovascular: Negative for chest pain.  Gastrointestinal: Negative for abdominal distention.  Musculoskeletal:       Neck pain, shoulder pain  Neurological: Negative for dizziness and headaches.    Physical Exam Updated Vital Signs BP 117/75 (BP Location: Left Arm)    Pulse 69    Temp 98.6 F (37 C) (Oral)    Resp 16    Ht 5\' 3"  (1.6 m)    Wt 82.1 kg    LMP 06/01/2020    SpO2 100%    BMI 32.06 kg/m   Physical Exam Vitals and nursing note reviewed.  Constitutional:      General: She is not in acute distress.    Appearance: Normal appearance. She is not ill-appearing.  HENT:     Head: Normocephalic and atraumatic.  Eyes:     General: No scleral icterus.       Right eye: No discharge.        Left eye: No discharge.     Conjunctiva/sclera:  Conjunctivae normal.  Pulmonary:     Effort: Pulmonary effort is normal.     Breath sounds: No stridor.  Musculoskeletal:     Comments: Tenderness to palpation of the right-sided trapezius muscles and right latissimus dorsi muscles.  Some right para thoracic vertebra muscular tenderness to palpation with trigger points.  Palpation of C-spine is without any  focal tenderness.  Questionably positive Spurling's.  Skin:    General: Skin is warm and dry.  Neurological:     Mental Status: She is alert and oriented to person, place, and time. Mental status is at baseline.     ED Results / Procedures / Treatments   Labs (all labs ordered are listed, but only abnormal results are displayed) Labs Reviewed - No data to display  EKG None  Radiology No results found.  Procedures Procedures   Medications Ordered in ED Medications  ketorolac (TORADOL) 30 MG/ML injection 30 mg (has no administration in time range)    ED Course  I have reviewed the triage vital signs and the nursing notes.  Pertinent labs & imaging results that were available during my care of the patient were reviewed by me and considered in my medical decision making (see chart for details).    MDM Rules/Calculators/A&P                          Patient with neck pain and right-sided back muscle aches lifting is palpation over the musculature.  I do have some concern for herniated disc.  Will prescribe prednisone in addition to her current regimen of analgesia.  Also will prescribe Voltaren gel and may continue Tylenol and ibuprofen recommendations.  Broad differential for back pain considered includes malignancy, disc herniation, spinal epidural abscess, spinal fracture, cauda equina, pyelonephritis, kidney stone, AAA, AD, pancreatitis, PE and PTX.   History without symptoms of urinary or stool retention or incontinence, neurologic changes such as sensation change or weakness lower extremities, coagulopathy or blood  thinner use, is not elderly or with history of osteoporosis, denies any history of cancer, fever, IV drug use, weight changes (unexplained), or prolonged steroid use.   Physical exam most consistent with muscular strain. Doubt cauda equina or disc herniation d/t lack of saddle anesthesia/bowel or bladder incontinence or urinary retention, normal gait and reassuring physical examination without neurologic deficits.   History is not supportive of kidney stone, AAA, AD, pancreatitis, PE or PTX. Patient has no CVA tenderness or urinary sx to suggest pyelonephritis or kidney stone.   Will manage patient conservatively at this time. NSAIDs, back exercises/stretches, heat therapy and follow up with PCP if symptoms do not resolve in 3-4 weeks. Patient offered muscle relaxer for comfort at night. Counseled on need to return to ED for fever, worsening or concerning symptoms. Patient agreeable to plan and states understanding of follow up plans and return precautions.    Vitals WNL at time of discharge and patient is no acute distress   Offered Toradol shot which was given  Patient discharged this time with close follow-up with PCP and her spine surgeon.  Final Clinical Impression(s) / ED Diagnoses Final diagnoses:  Neck pain  Radiculopathy, unspecified spinal region    Rx / DC Orders ED Discharge Orders         Ordered    predniSONE (DELTASONE) 10 MG tablet  Daily        06/05/20 1334    diclofenac Sodium (VOLTAREN) 1 % GEL  4 times daily        06/05/20 1336           Pati Gallo [place], [place] 06/05/20 1341    Gareth Morgan, MD 06/06/20 [phone]

## 2020-06-06 ENCOUNTER — Other Ambulatory Visit (HOSPITAL_BASED_OUTPATIENT_CLINIC_OR_DEPARTMENT_OTHER): Payer: Self-pay | Admitting: Internal Medicine

## 2020-06-23 ENCOUNTER — Other Ambulatory Visit (HOSPITAL_BASED_OUTPATIENT_CLINIC_OR_DEPARTMENT_OTHER): Payer: Self-pay | Admitting: Neurosurgery

## 2020-06-24 ENCOUNTER — Other Ambulatory Visit (HOSPITAL_BASED_OUTPATIENT_CLINIC_OR_DEPARTMENT_OTHER): Payer: Self-pay

## 2020-06-24 MED FILL — Gabapentin Cap 300 MG: ORAL | 15 days supply | Qty: 45 | Fill #0 | Status: AC

## 2020-06-24 MED FILL — Metoprolol Succinate Tab ER 24HR 25 MG (Tartrate Equiv): ORAL | 30 days supply | Qty: 30 | Fill #0 | Status: AC

## 2020-06-24 MED FILL — Meloxicam Tab 15 MG: ORAL | 30 days supply | Qty: 30 | Fill #0 | Status: AC

## 2020-06-24 MED FILL — Tizanidine HCl Tab 2 MG (Base Equivalent): ORAL | 10 days supply | Qty: 40 | Fill #0 | Status: AC

## 2020-06-26 ENCOUNTER — Other Ambulatory Visit (HOSPITAL_BASED_OUTPATIENT_CLINIC_OR_DEPARTMENT_OTHER): Payer: Self-pay

## 2020-06-26 MED FILL — Metoprolol Succinate Tab ER 24HR 100 MG (Tartrate Equiv): ORAL | 30 days supply | Qty: 30 | Fill #0 | Status: AC

## 2020-07-05 ENCOUNTER — Other Ambulatory Visit (HOSPITAL_BASED_OUTPATIENT_CLINIC_OR_DEPARTMENT_OTHER): Payer: Self-pay

## 2020-07-05 MED FILL — Pantoprazole Sodium EC Tab 20 MG (Base Equiv): ORAL | 30 days supply | Qty: 30 | Fill #0 | Status: AC

## 2020-07-05 MED FILL — Valacyclovir HCl Tab 500 MG: ORAL | 30 days supply | Qty: 30 | Fill #0 | Status: AC

## 2020-07-05 MED FILL — Hydrochlorothiazide Tab 25 MG: ORAL | 30 days supply | Qty: 30 | Fill #0 | Status: AC

## 2020-07-05 MED FILL — Citalopram Hydrobromide Tab 40 MG (Base Equiv): ORAL | 30 days supply | Qty: 30 | Fill #0 | Status: AC

## 2020-07-26 ENCOUNTER — Other Ambulatory Visit (HOSPITAL_BASED_OUTPATIENT_CLINIC_OR_DEPARTMENT_OTHER): Payer: Self-pay

## 2020-07-26 MED FILL — Metoprolol Succinate Tab ER 24HR 25 MG (Tartrate Equiv): ORAL | 30 days supply | Qty: 30 | Fill #1 | Status: AC

## 2020-07-26 MED FILL — Metoprolol Succinate Tab ER 24HR 100 MG (Tartrate Equiv): ORAL | 30 days supply | Qty: 30 | Fill #1 | Status: AC

## 2020-08-04 ENCOUNTER — Other Ambulatory Visit (HOSPITAL_BASED_OUTPATIENT_CLINIC_OR_DEPARTMENT_OTHER): Payer: Self-pay

## 2020-08-04 MED FILL — Valacyclovir HCl Tab 500 MG: ORAL | 30 days supply | Qty: 30 | Fill #1 | Status: AC

## 2020-08-06 MED FILL — Pantoprazole Sodium EC Tab 20 MG (Base Equiv): ORAL | 30 days supply | Qty: 30 | Fill #1 | Status: AC

## 2020-08-06 MED FILL — Citalopram Hydrobromide Tab 40 MG (Base Equiv): ORAL | 30 days supply | Qty: 30 | Fill #1 | Status: AC

## 2020-08-06 MED FILL — Hydrochlorothiazide Tab 25 MG: ORAL | 30 days supply | Qty: 30 | Fill #1 | Status: AC

## 2020-08-07 ENCOUNTER — Other Ambulatory Visit (HOSPITAL_BASED_OUTPATIENT_CLINIC_OR_DEPARTMENT_OTHER): Payer: Self-pay

## 2020-08-09 ENCOUNTER — Other Ambulatory Visit (HOSPITAL_BASED_OUTPATIENT_CLINIC_OR_DEPARTMENT_OTHER): Payer: Self-pay

## 2020-08-18 ENCOUNTER — Other Ambulatory Visit (HOSPITAL_BASED_OUTPATIENT_CLINIC_OR_DEPARTMENT_OTHER): Payer: Self-pay

## 2020-08-18 MED FILL — Meloxicam Tab 15 MG: ORAL | 30 days supply | Qty: 30 | Fill #1 | Status: AC

## 2020-08-18 MED FILL — Simvastatin Tab 40 MG: ORAL | 30 days supply | Qty: 30 | Fill #0 | Status: AC

## 2020-08-24 ENCOUNTER — Other Ambulatory Visit (HOSPITAL_BASED_OUTPATIENT_CLINIC_OR_DEPARTMENT_OTHER): Payer: Self-pay

## 2020-08-24 MED FILL — Metoprolol Succinate Tab ER 24HR 100 MG (Tartrate Equiv): ORAL | 30 days supply | Qty: 30 | Fill #2 | Status: AC

## 2020-08-24 MED FILL — Metoprolol Succinate Tab ER 24HR 25 MG (Tartrate Equiv): ORAL | 30 days supply | Qty: 30 | Fill #2 | Status: AC

## 2020-09-11 ENCOUNTER — Other Ambulatory Visit (HOSPITAL_BASED_OUTPATIENT_CLINIC_OR_DEPARTMENT_OTHER): Payer: Self-pay

## 2020-09-11 MED FILL — Valacyclovir HCl Tab 500 MG: ORAL | 30 days supply | Qty: 30 | Fill #2 | Status: AC

## 2020-09-11 MED FILL — Hydrochlorothiazide Tab 25 MG: ORAL | 30 days supply | Qty: 30 | Fill #2 | Status: AC

## 2020-09-11 MED FILL — Citalopram Hydrobromide Tab 40 MG (Base Equiv): ORAL | 30 days supply | Qty: 30 | Fill #2 | Status: AC

## 2020-09-11 MED FILL — Pantoprazole Sodium EC Tab 20 MG (Base Equiv): ORAL | 30 days supply | Qty: 30 | Fill #2 | Status: AC

## 2020-09-13 ENCOUNTER — Other Ambulatory Visit (HOSPITAL_BASED_OUTPATIENT_CLINIC_OR_DEPARTMENT_OTHER): Payer: Self-pay

## 2020-09-25 MED FILL — Simvastatin Tab 40 MG: ORAL | 30 days supply | Qty: 30 | Fill #1 | Status: AC

## 2020-09-25 MED FILL — Metoprolol Succinate Tab ER 24HR 100 MG (Tartrate Equiv): ORAL | 30 days supply | Qty: 30 | Fill #3 | Status: AC

## 2020-09-25 MED FILL — Metoprolol Succinate Tab ER 24HR 25 MG (Tartrate Equiv): ORAL | 30 days supply | Qty: 30 | Fill #3 | Status: AC

## 2020-09-26 ENCOUNTER — Other Ambulatory Visit (HOSPITAL_BASED_OUTPATIENT_CLINIC_OR_DEPARTMENT_OTHER): Payer: Self-pay

## 2020-10-16 ENCOUNTER — Other Ambulatory Visit (HOSPITAL_BASED_OUTPATIENT_CLINIC_OR_DEPARTMENT_OTHER): Payer: Self-pay

## 2020-10-16 ENCOUNTER — Emergency Department (HOSPITAL_BASED_OUTPATIENT_CLINIC_OR_DEPARTMENT_OTHER)
Admission: EM | Admit: 2020-10-16 | Discharge: 2020-10-16 | Disposition: A | Discharge status: Home / Self Care | Payer: BC Managed Care – PPO | Attending: Emergency Medicine | Admitting: Emergency Medicine

## 2020-10-16 ENCOUNTER — Other Ambulatory Visit: Payer: Self-pay

## 2020-10-16 DIAGNOSIS — S80812A Abrasion, left lower leg, initial encounter: Secondary | ICD-10-CM | POA: Insufficient documentation

## 2020-10-16 DIAGNOSIS — Z5321 Procedure and treatment not carried out due to patient leaving prior to being seen by health care provider: Secondary | ICD-10-CM | POA: Insufficient documentation

## 2020-10-16 DIAGNOSIS — W548XXA Other contact with dog, initial encounter: Secondary | ICD-10-CM | POA: Insufficient documentation

## 2020-10-16 DIAGNOSIS — H9201 Otalgia, right ear: Secondary | ICD-10-CM | POA: Insufficient documentation

## 2020-10-16 MED FILL — Potassium Chloride Microencapsulated Crys ER Tab 20 mEq: ORAL | 30 days supply | Qty: 30 | Fill #0 | Status: AC

## 2020-10-16 MED FILL — Valacyclovir HCl Tab 500 MG: ORAL | 30 days supply | Qty: 30 | Fill #3 | Status: AC

## 2020-10-16 MED FILL — Hydrochlorothiazide Tab 25 MG: ORAL | 30 days supply | Qty: 30 | Fill #3 | Status: AC

## 2020-10-16 MED FILL — Citalopram Hydrobromide Tab 40 MG (Base Equiv): ORAL | 30 days supply | Qty: 30 | Fill #3 | Status: AC

## 2020-10-16 MED FILL — Pantoprazole Sodium EC Tab 20 MG (Base Equiv): ORAL | 30 days supply | Qty: 30 | Fill #3 | Status: AC

## 2020-10-16 NOTE — ED Notes (Addendum)
Pt called for again in waiting room with no answer.

## 2020-10-16 NOTE — ED Triage Notes (Signed)
Pt c/o right ear pain since Friday.   Also has abrasion to left shin from dog leash, no signs of infection

## 2020-10-17 ENCOUNTER — Other Ambulatory Visit (HOSPITAL_BASED_OUTPATIENT_CLINIC_OR_DEPARTMENT_OTHER): Payer: Self-pay

## 2020-10-18 ENCOUNTER — Other Ambulatory Visit (HOSPITAL_BASED_OUTPATIENT_CLINIC_OR_DEPARTMENT_OTHER): Payer: Self-pay

## 2020-10-19 ENCOUNTER — Other Ambulatory Visit (HOSPITAL_BASED_OUTPATIENT_CLINIC_OR_DEPARTMENT_OTHER): Payer: Self-pay

## 2020-10-19 MED ORDER — ONDANSETRON 4 MG PO TBDP
ORAL_TABLET | ORAL | 0 refills | Status: DC
  Filled 2020-10-19: qty 6, 5d supply, fill #0

## 2020-10-19 MED ORDER — CHLORHEXIDINE GLUCONATE 0.12 % MT SOLN
OROMUCOSAL | 0 refills | Status: DC
  Filled 2020-10-19: qty 473, 30d supply, fill #0

## 2020-10-19 MED ORDER — HYDROCODONE-ACETAMINOPHEN 5-325 MG PO TABS
ORAL_TABLET | ORAL | 0 refills | Status: DC
  Filled 2020-10-19: qty 8, 3d supply, fill #0

## 2020-10-19 MED ORDER — LORAZEPAM 2 MG PO TABS
ORAL_TABLET | ORAL | 0 refills | Status: AC
  Filled 2020-10-19: qty 1, 1d supply, fill #0

## 2020-10-19 MED ORDER — AMOXICILLIN 250 MG PO CAPS
ORAL_CAPSULE | ORAL | 2 refills | Status: DC
  Filled 2020-10-19: qty 15, 5d supply, fill #0
  Filled 2020-12-14: qty 15, 5d supply, fill #1
  Filled 2021-02-08: qty 15, 5d supply, fill #2

## 2020-10-23 ENCOUNTER — Ambulatory Visit: Payer: BC Managed Care – PPO | Admitting: Obstetrics & Gynecology

## 2020-10-25 ENCOUNTER — Other Ambulatory Visit (HOSPITAL_BASED_OUTPATIENT_CLINIC_OR_DEPARTMENT_OTHER): Payer: Self-pay

## 2020-10-25 MED FILL — Metoprolol Succinate Tab ER 24HR 25 MG (Tartrate Equiv): ORAL | 30 days supply | Qty: 30 | Fill #4 | Status: AC

## 2020-10-25 MED FILL — Metoprolol Succinate Tab ER 24HR 100 MG (Tartrate Equiv): ORAL | 30 days supply | Qty: 30 | Fill #4 | Status: AC

## 2020-10-30 ENCOUNTER — Encounter (HOSPITAL_BASED_OUTPATIENT_CLINIC_OR_DEPARTMENT_OTHER): Payer: Self-pay

## 2020-10-30 ENCOUNTER — Other Ambulatory Visit: Payer: Self-pay

## 2020-10-30 ENCOUNTER — Emergency Department (HOSPITAL_BASED_OUTPATIENT_CLINIC_OR_DEPARTMENT_OTHER)
Admission: EM | Admit: 2020-10-30 | Discharge: 2020-10-30 | Disposition: A | Discharge status: Home / Self Care | Payer: BC Managed Care – PPO | Attending: Emergency Medicine | Admitting: Emergency Medicine

## 2020-10-30 DIAGNOSIS — J45909 Unspecified asthma, uncomplicated: Secondary | ICD-10-CM | POA: Diagnosis not present

## 2020-10-30 DIAGNOSIS — Z5189 Encounter for other specified aftercare: Secondary | ICD-10-CM

## 2020-10-30 DIAGNOSIS — Z7951 Long term (current) use of inhaled steroids: Secondary | ICD-10-CM | POA: Insufficient documentation

## 2020-10-30 DIAGNOSIS — I10 Essential (primary) hypertension: Secondary | ICD-10-CM | POA: Insufficient documentation

## 2020-10-30 DIAGNOSIS — Z4800 Encounter for change or removal of nonsurgical wound dressing: Secondary | ICD-10-CM | POA: Insufficient documentation

## 2020-10-30 DIAGNOSIS — Z7982 Long term (current) use of aspirin: Secondary | ICD-10-CM | POA: Insufficient documentation

## 2020-10-30 DIAGNOSIS — Z79899 Other long term (current) drug therapy: Secondary | ICD-10-CM | POA: Diagnosis not present

## 2020-10-30 MED ORDER — BACITRACIN ZINC 500 UNIT/GM EX OINT
TOPICAL_OINTMENT | Freq: Once | CUTANEOUS | Status: AC
  Administered 2020-10-30: 1 via TOPICAL

## 2020-10-30 NOTE — ED Provider Notes (Signed)
[facility] EMERGENCY DEPARTMENT Provider Note   CSN: [id number] Arrival date & time: 10/30/20  1522     History Chief Complaint  Patient presents with   Wound Check    Katherine Wiggins is a 55 y.o. female.  Katherine Wiggins is a 55 y.o. female with history of asthma, hypertension, high cholesterol, fibromyalgia, depression, who presents to the ED for evaluation of wound check.  Patient reports on July 26 she was dog sitting and walking her dog when she got tangled up in the leash and it caused an abrasion to her left ankle.  Patient reports that she is concerned the wound is not healing completely.  She reports it still been a bit tender but its not been red and she has not had any drainage.  Reports that she has been cleaning it 1-2 times daily with soap and water followed by hydrogen peroxide, Neosporin and an antibiotic Band-Aid.  She has not had any fevers, and reports she usually does not have any issues with wound healing, is not diabetic.  No other aggravating or alleviating factors.  The history is provided by the patient.      Past Medical History:  Diagnosis Date   Asthma    Depression    Endometriosis    Fibromyalgia    High cholesterol    History of salpingectomy    Right   Hypertension    RA (rheumatoid arthritis) ([place])     Patient Active Problem List   Diagnosis Date Noted   Hypertension, essential 08/05/2018   Elevated cholesterol 08/05/2018   Rheumatoid arthritis ([place]) 08/05/2018   Abnormal uterine bleeding (AUB) 04/20/2018    Past Surgical History:  Procedure Laterality Date   CERVICAL SPINE SURGERY     RIGHT OOPHORECTOMY Right 1995   SALPINGECTOMY Right 1995   TUBAL LIGATION       OB History     Gravida  5   Para  3   Term  2   Preterm  1   AB  2   Living  3      SAB  2   IAB      Ectopic      Multiple      Live Births  3           Family History  Problem Relation Age of Onset   Diabetes Mother     Hypertension Mother    Diabetes Sister    Hypertension Sister    Hypertension Brother     Social History   Tobacco Use   Smoking status: Never   Smokeless tobacco: Never  Substance Use Topics   Alcohol use: No   Drug use: No    Home Medications Prior to Admission medications   Medication Sig Start Date End Date Taking? Authorizing Provider  albuterol (VENTOLIN HFA) 108 (90 Base) MCG/ACT inhaler INHALE 1 - 2 PUFFS INTO THE LUNGS EVERY 6 HOURS AS NEEDED FOR WHEEZING OR SHORTNESS OF BREATH 04/03/20 04/03/21  Long, Wonda Olds, MD  amitriptyline (ELAVIL) 25 MG tablet Take 25 mg by mouth at bedtime.    [provider]  amitriptyline (ELAVIL) 25 MG tablet TAKE 1 TABLET (25 MG TOTAL) BY MOUTH NIGHTLY. 05/02/20 05/02/21  Jenel Lucks, PA-C  amoxicillin (AMOXIL) 250 MG capsule Take 1 capsule by mouth 3 times daily 10/19/20     aspirin 81 MG tablet Take 81 mg by mouth daily.    [provider]  benzonatate (TESSALON) 100 MG  capsule TAKE 1 CAPSULE BY MOUTH THREE TIMES DAILY AS NEEDED FOR COUGH 04/03/20 04/03/21  Long, Wonda Olds, MD  benzonatate (TESSALON) 200 MG capsule TAKE 1 CAPSULE (200 MG TOTAL) BY MOUTH EVERY 8 (EIGHT) HOURS FOR 10 DAYS. 01/03/20 01/02/21  Tacy Learn, PA-C  cetirizine (ZYRTEC) 10 MG tablet TAKE 1 TABLET (10 MG TOTAL) BY MOUTH DAILY. 01/03/20 01/02/21  Tacy Learn, PA-C  chlorhexidine (PERIDEX) 0.12 % solution Take 58ms by mouth 2 times daily after brushing teeth. Swish in mouth for 30 seconds then spit out 10/19/20     citalopram (CELEXA) 40 MG tablet Take 40 mg by mouth daily.    [provider]  citalopram (CELEXA) 40 MG tablet TAKE 1 TABLET (40 MG TOTAL) BY MOUTH DAILY. 05/02/20 05/02/21  OJenel Lucks PA-C  diclofenac Sodium (VOLTAREN) 1 % GEL APPLY 2 GRAMS TOPICALLY 4 (FOUR) TIMES DAILY FOR 6 DAYS. 06/05/20 06/05/21  Fondaw, WKathleene Hazel PA  fluticasone (FLONASE) 50 MCG/ACT nasal spray PLACE 1 SPRAY INTO BOTH NOSTRILS DAILY.  01/03/20 01/02/21  MSuella BroadA, PA-C  gabapentin (NEURONTIN) 100 MG capsule TAKE 1 CAPSULE (100 MG TOTAL) BY MOUTH 3 TIMES DAILY. 06/06/20 06/06/21  OJenel Lucks PA-C  gabapentin (NEURONTIN) 300 MG capsule TAKE 1 CAPSULE BY MOUTH AT BEDTIME FOR 3 DAYS THEN TAKE 1 CAPSULE 3 TIMES A DAY 06/23/20 06/23/21  PBonnee Quin PA-C  hydrochlorothiazide (HYDRODIURIL) 25 MG tablet Take by mouth. 05/27/18   [provider]  hydrochlorothiazide (HYDRODIURIL) 25 MG tablet TAKE 1 TABLET (25 MG TOTAL) BY MOUTH DAILY. 05/02/20 05/02/21  OJenel Lucks PA-C  HYDROcodone-acetaminophen (NORCO/VICODIN) 5-325 MG tablet Take 1 tablet by mouth every 6 hours as needed for pain 10/19/20     LORazepam (ATIVAN) 2 MG tablet Take 1 tablet by mouth 1 hour pre-op with a small sip of water 10/19/20     meloxicam (MOBIC) 15 MG tablet TAKE 1 TABLET (15 MG TOTAL) BY MOUTH DAILY. 06/23/20 06/23/21  PBonnee Quin PA-C  methocarbamol (ROBAXIN) 500 MG tablet Take 1 tablet (500 mg total) by mouth every 8 (eight) hours as needed for muscle spasms. 06/03/20   JRayna Sexton PA-C  methocarbamol (ROBAXIN) 750 MG tablet TAKE 1 TABLET (750 MG TOTAL) BY MOUTH 3 TIMES DAILY FOR 10 DAYS. 06/06/20 06/06/21  OJenel Lucks PA-C  metoprolol succinate (TOPROL-XL) 100 MG 24 hr tablet TAKE 1 TABLET (100 MG TOTAL) BY MOUTH EVERY MORNING. 05/02/20 05/02/21  OJenel Lucks PA-C  metoprolol succinate (TOPROL-XL) 25 MG 24 hr tablet TAKE 1 TABLET (25 MG TOTAL) BY MOUTH DAILY WITH 100 MG TABLET. 05/02/20 05/02/21  OJenel Lucks PA-C  metoprolol succinate (TOPROL-XL) 25 MG 24 hr tablet TAKE 1 TABLET BY MOUTH DAILY WITH '100MG'$  TABLET **NEEDS AN OFFICE VISIT** 02/09/20 02/08/21  OJenel Lucks PA-C  metoprolol tartrate (LOPRESSOR) 100 MG tablet Take 100 mg by mouth 2 (two) times daily.    [provider]  Norethindrone Acetate-Ethinyl Estrad-FE (LOESTRIN 24 FE) 1-20 MG-MCG(24) tablet Take 1  tablet by mouth daily. Patient not taking: Reported on 10/12/2018 04/20/18   HLavonia Drafts MD  Norethindrone Acetate-Ethinyl Estradiol (LOESTRIN) 1.5-30 MG-MCG tablet TAKE 1 TABLET BY MOUTH DAILY. 07/30/19 07/29/20  HLavonia Drafts MD  ondansetron (ZOFRAN-ODT) 4 MG disintegrating tablet Place 1 tablet on the top of tongue where they will dissolve, then swallow every 8 hours as needed for nausea/vomiting 10/19/20     pantoprazole (PROTONIX) 20 MG tablet Take 20 mg by mouth daily.  [provider]  pantoprazole (PROTONIX) 20 MG tablet TAKE 1 TABLET (20 MG TOTAL) BY MOUTH DAILY. 05/02/20 05/02/21  Jenel Lucks, PA-C  pantoprazole (PROTONIX) 20 MG tablet TAKE 1 TABLET (20 MG TOTAL) BY MOUTH DAILY. **NEEDS AN OFFICE VISIT** 02/04/20 02/03/21  Jenel Lucks, PA-C  potassium chloride SA (KLOR-CON) 20 MEQ tablet Take by mouth. 05/27/18   [provider]  potassium chloride SA (KLOR-CON) 20 MEQ tablet TAKE 1 TABLET (20 MEQ TOTAL) BY MOUTH DAILY. 05/02/20 05/02/21  Jenel Lucks, PA-C  predniSONE (DELTASONE) 10 MG tablet TAKE 4 TABLETS (40 MG TOTAL) BY MOUTH DAILY FOR 4 DAYS. 06/05/20 06/05/21  Tedd Sias, PA  ranitidine (ZANTAC) 150 MG capsule Take 150 mg by mouth every evening.    [provider]  simvastatin (ZOCOR) 40 MG tablet Take 40 mg by mouth at bedtime.      [provider]  simvastatin (ZOCOR) 40 MG tablet TAKE 1 TABLET (40 MG TOTAL) BY MOUTH DAILY. 05/02/20 05/02/21  Jenel Lucks, PA-C  simvastatin (ZOCOR) 40 MG tablet TAKE 1 TABLET BY MOUTH ONCE DAILY 05/24/19 05/23/20  Jenel Lucks, PA-C  tiZANidine (ZANAFLEX) 2 MG tablet TAKE 1/2 TO 1 TABLET BY MOUTH EVERY 6 HOURS AS NEEDED FOR BACK SPASM OR CRAMPING. 06/23/20 06/23/21  Bonnee Quin, PA-C  valACYclovir (VALTREX) 500 MG tablet TAKE 1 TABLET (500 MG TOTAL) BY MOUTH DAILY. 05/02/20 05/02/21  Jenel Lucks, PA-C    Allergies    Codeine,  Nitrofurantoin, Sulfamethoxazole-trimethoprim, and Septra [sulfamethoxazole w/trimethoprim (co-trimoxazole)]  Review of Systems   Review of Systems  Constitutional:  Negative for chills and fever.  Skin:  Positive for wound.  All other systems reviewed and are negative.  Physical Exam Updated Vital Signs BP 104/72   Pulse 70   Temp 98 F (36.7 C) (Oral)   Resp 16   Ht '5\' 3"'$  (1.6 m)   Wt 81.6 kg   SpO2 100%   BMI 31.89 kg/m   Physical Exam Vitals and nursing note reviewed.  Constitutional:      General: She is not in acute distress.    Appearance: Normal appearance. She is well-developed. She is not ill-appearing or diaphoretic.  HENT:     Head: Normocephalic and atraumatic.  Eyes:     General:        Right eye: No discharge.        Left eye: No discharge.  Pulmonary:     Effort: Pulmonary effort is normal. No respiratory distress.  Musculoskeletal:     Comments: Left ankle with abrasion to the lateral aspect, no purulent drainage or surrounding erythema, pink granulation tissue present with small amount of scabbing.  No surrounding induration, very mildly tender.  Skin:    General: Skin is warm and dry.  Neurological:     Mental Status: She is alert and oriented to person, place, and time.     Coordination: Coordination normal.  Psychiatric:        Mood and Affect: Mood normal.        Behavior: Behavior normal.    ED Results / Procedures / Treatments   Labs (all labs ordered are listed, but only abnormal results are displayed) Labs Reviewed - No data to display  EKG None  Radiology No results found.  Procedures Procedures   Medications Ordered in ED Medications  bacitracin ointment (1 application Topical Given 10/30/20 1720)    ED Course  I have reviewed the triage vital signs  and the nursing notes.  Pertinent labs & imaging results that were available during my care of the patient were reviewed by me and considered in my medical decision making (see  chart for details).    MDM Rules/Calculators/A&P                           55 year old female sustained an abrasion to the left ankle on 7/26 and is concerned that it has not yet completely healed.  She has been cleaning it daily with hydrogen peroxide and I suspect this is been inhibiting healing.  Wound does not appear infected, there is pink granulation tissue present but without much scabbing.  Provided education on using soap and water rather than hydrogen peroxide and avoiding Neosporin as it can often cause mild allergic inflammation that also slows healing.  Encouraged use of bacitracin.  Discussed appropriate return precautions outpatient follow-up.  Bacitracin and bandage applied.  Patient expresses understanding and agreement.  Discharged home in good condition.  Final Clinical Impression(s) / ED Diagnoses Final diagnoses:  Visit for wound check    Rx / DC Orders ED Discharge Orders     None        Janet Berlin 10/30/20 1736    Drenda Freeze, MD 10/30/20 [phone]

## 2020-10-30 NOTE — ED Triage Notes (Signed)
"  July 26th I cut my right lower leg on a dog leash and it is not healing appropriately" per pt.

## 2020-10-30 NOTE — ED Notes (Signed)
Wound to left heal dressed with dsd and bacitracin

## 2020-10-30 NOTE — Discharge Instructions (Addendum)
Clean wound with soap and water avoid using any hydrogen peroxide as this can inhibit wound healing.  Many people are also allergic to Neosporin and it causes mild inflammation that slows healing instead use bacitracin antibiotic ointment.  Follow-up with your PCP if wound is still not healing but I suspect this will heal up well, does not appear infected.

## 2020-11-17 ENCOUNTER — Other Ambulatory Visit (HOSPITAL_BASED_OUTPATIENT_CLINIC_OR_DEPARTMENT_OTHER): Payer: Self-pay

## 2020-11-17 MED FILL — Hydrochlorothiazide Tab 25 MG: ORAL | 30 days supply | Qty: 30 | Fill #4 | Status: AC

## 2020-11-17 MED FILL — Pantoprazole Sodium EC Tab 20 MG (Base Equiv): ORAL | 30 days supply | Qty: 30 | Fill #4 | Status: AC

## 2020-11-17 MED FILL — Citalopram Hydrobromide Tab 40 MG (Base Equiv): ORAL | 30 days supply | Qty: 30 | Fill #4 | Status: AC

## 2020-11-17 MED FILL — Simvastatin Tab 40 MG: ORAL | 30 days supply | Qty: 30 | Fill #2 | Status: AC

## 2020-11-25 MED FILL — Valacyclovir HCl Tab 500 MG: ORAL | 30 days supply | Qty: 30 | Fill #4 | Status: AC

## 2020-11-25 MED FILL — Metoprolol Succinate Tab ER 24HR 25 MG (Tartrate Equiv): ORAL | 30 days supply | Qty: 30 | Fill #5 | Status: AC

## 2020-11-25 MED FILL — Metoprolol Succinate Tab ER 24HR 100 MG (Tartrate Equiv): ORAL | 30 days supply | Qty: 30 | Fill #5 | Status: AC

## 2020-11-28 ENCOUNTER — Other Ambulatory Visit (HOSPITAL_BASED_OUTPATIENT_CLINIC_OR_DEPARTMENT_OTHER): Payer: Self-pay

## 2020-12-14 ENCOUNTER — Other Ambulatory Visit (HOSPITAL_BASED_OUTPATIENT_CLINIC_OR_DEPARTMENT_OTHER): Payer: Self-pay

## 2020-12-14 MED FILL — Potassium Chloride Microencapsulated Crys ER Tab 20 mEq: ORAL | 30 days supply | Qty: 30 | Fill #1 | Status: AC

## 2020-12-20 MED FILL — Hydrochlorothiazide Tab 25 MG: ORAL | 30 days supply | Qty: 30 | Fill #5 | Status: AC

## 2020-12-20 MED FILL — Citalopram Hydrobromide Tab 40 MG (Base Equiv): ORAL | 30 days supply | Qty: 30 | Fill #5 | Status: AC

## 2020-12-20 MED FILL — Pantoprazole Sodium EC Tab 20 MG (Base Equiv): ORAL | 30 days supply | Qty: 30 | Fill #5 | Status: AC

## 2020-12-21 ENCOUNTER — Other Ambulatory Visit (HOSPITAL_BASED_OUTPATIENT_CLINIC_OR_DEPARTMENT_OTHER): Payer: Self-pay

## 2020-12-28 ENCOUNTER — Other Ambulatory Visit (HOSPITAL_BASED_OUTPATIENT_CLINIC_OR_DEPARTMENT_OTHER): Payer: Self-pay

## 2020-12-28 MED FILL — Metoprolol Succinate Tab ER 24HR 100 MG (Tartrate Equiv): ORAL | 30 days supply | Qty: 30 | Fill #6 | Status: AC

## 2020-12-28 MED FILL — Metoprolol Succinate Tab ER 24HR 25 MG (Tartrate Equiv): ORAL | 30 days supply | Qty: 30 | Fill #6 | Status: AC

## 2020-12-31 MED FILL — Simvastatin Tab 40 MG: ORAL | 30 days supply | Qty: 30 | Fill #3 | Status: AC

## 2020-12-31 MED FILL — Valacyclovir HCl Tab 500 MG: ORAL | 30 days supply | Qty: 30 | Fill #5 | Status: AC

## 2021-01-01 ENCOUNTER — Other Ambulatory Visit (HOSPITAL_BASED_OUTPATIENT_CLINIC_OR_DEPARTMENT_OTHER): Payer: Self-pay

## 2021-01-03 ENCOUNTER — Other Ambulatory Visit (HOSPITAL_BASED_OUTPATIENT_CLINIC_OR_DEPARTMENT_OTHER): Payer: Self-pay

## 2021-01-03 MED ORDER — NEOMYCIN-POLYMYXIN-HC 3.5-10000-1 OP SUSP
OPHTHALMIC | 0 refills | Status: DC
  Filled 2021-01-03: qty 7.5, 19d supply, fill #0

## 2021-01-03 MED ORDER — DOXYCYCLINE HYCLATE 100 MG PO CAPS
ORAL_CAPSULE | ORAL | 0 refills | Status: DC
  Filled 2021-01-03: qty 14, 7d supply, fill #0

## 2021-01-22 MED FILL — Pantoprazole Sodium EC Tab 20 MG (Base Equiv): ORAL | 30 days supply | Qty: 30 | Fill #6 | Status: AC

## 2021-01-22 MED FILL — Citalopram Hydrobromide Tab 40 MG (Base Equiv): ORAL | 30 days supply | Qty: 30 | Fill #6 | Status: AC

## 2021-01-22 MED FILL — Hydrochlorothiazide Tab 25 MG: ORAL | 30 days supply | Qty: 30 | Fill #6 | Status: AC

## 2021-01-23 ENCOUNTER — Other Ambulatory Visit (HOSPITAL_BASED_OUTPATIENT_CLINIC_OR_DEPARTMENT_OTHER): Payer: Self-pay

## 2021-02-01 ENCOUNTER — Other Ambulatory Visit (HOSPITAL_BASED_OUTPATIENT_CLINIC_OR_DEPARTMENT_OTHER): Payer: Self-pay

## 2021-02-01 MED FILL — Simvastatin Tab 40 MG: ORAL | 30 days supply | Qty: 30 | Fill #4 | Status: AC

## 2021-02-01 MED FILL — Metoprolol Succinate Tab ER 24HR 100 MG (Tartrate Equiv): ORAL | 30 days supply | Qty: 30 | Fill #7 | Status: AC

## 2021-02-01 MED FILL — Valacyclovir HCl Tab 500 MG: ORAL | 30 days supply | Qty: 30 | Fill #6 | Status: AC

## 2021-02-01 MED FILL — Metoprolol Succinate Tab ER 24HR 25 MG (Tartrate Equiv): ORAL | 30 days supply | Qty: 30 | Fill #7 | Status: AC

## 2021-02-08 ENCOUNTER — Other Ambulatory Visit (HOSPITAL_BASED_OUTPATIENT_CLINIC_OR_DEPARTMENT_OTHER): Payer: Self-pay

## 2021-02-13 ENCOUNTER — Emergency Department (HOSPITAL_BASED_OUTPATIENT_CLINIC_OR_DEPARTMENT_OTHER)
Admission: EM | Admit: 2021-02-13 | Discharge: 2021-02-13 | Disposition: A | Discharge status: Home / Self Care | Payer: BC Managed Care – PPO | Attending: Emergency Medicine | Admitting: Emergency Medicine

## 2021-02-13 ENCOUNTER — Other Ambulatory Visit (HOSPITAL_BASED_OUTPATIENT_CLINIC_OR_DEPARTMENT_OTHER): Payer: Self-pay

## 2021-02-13 ENCOUNTER — Encounter (HOSPITAL_BASED_OUTPATIENT_CLINIC_OR_DEPARTMENT_OTHER): Payer: Self-pay

## 2021-02-13 ENCOUNTER — Other Ambulatory Visit: Payer: Self-pay

## 2021-02-13 DIAGNOSIS — J45909 Unspecified asthma, uncomplicated: Secondary | ICD-10-CM | POA: Diagnosis not present

## 2021-02-13 DIAGNOSIS — Z7951 Long term (current) use of inhaled steroids: Secondary | ICD-10-CM | POA: Diagnosis not present

## 2021-02-13 DIAGNOSIS — Z20822 Contact with and (suspected) exposure to covid-19: Secondary | ICD-10-CM | POA: Diagnosis not present

## 2021-02-13 DIAGNOSIS — I10 Essential (primary) hypertension: Secondary | ICD-10-CM | POA: Diagnosis not present

## 2021-02-13 DIAGNOSIS — J069 Acute upper respiratory infection, unspecified: Secondary | ICD-10-CM | POA: Diagnosis not present

## 2021-02-13 DIAGNOSIS — Z79899 Other long term (current) drug therapy: Secondary | ICD-10-CM | POA: Diagnosis not present

## 2021-02-13 DIAGNOSIS — Z7982 Long term (current) use of aspirin: Secondary | ICD-10-CM | POA: Insufficient documentation

## 2021-02-13 DIAGNOSIS — R059 Cough, unspecified: Secondary | ICD-10-CM | POA: Diagnosis present

## 2021-02-13 DIAGNOSIS — R051 Acute cough: Secondary | ICD-10-CM

## 2021-02-13 LAB — RESP PANEL BY RT-PCR (FLU A&B, COVID) ARPGX2
Influenza A by PCR: NEGATIVE
Influenza B by PCR: NEGATIVE
SARS Coronavirus 2 by RT PCR: NEGATIVE

## 2021-02-13 MED ORDER — AMOXICILLIN-POT CLAVULANATE 875-125 MG PO TABS
1.0000 | ORAL_TABLET | Freq: Two times a day (BID) | ORAL | 0 refills | Status: DC
  Filled 2021-02-13: qty 14, 7d supply, fill #0

## 2021-02-13 MED ORDER — BENZONATATE 100 MG PO CAPS
100.0000 mg | ORAL_CAPSULE | Freq: Three times a day (TID) | ORAL | 0 refills | Status: DC
  Filled 2021-02-13: qty 15, 5d supply, fill #0

## 2021-02-13 MED ORDER — PREDNISONE 20 MG PO TABS
40.0000 mg | ORAL_TABLET | Freq: Every day | ORAL | 0 refills | Status: AC
  Filled 2021-02-13: qty 10, 5d supply, fill #0

## 2021-02-13 NOTE — ED Provider Notes (Signed)
[name] EMERGENCY DEPARTMENT Provider Note   CSN: [id number] Arrival date & time: 02/13/21  1556     History Chief Complaint  Patient presents with   Cough    Katherine Wiggins is a 55 y.o. female.  HPI 55 year old female with a past medical history documented below who presents to the emergency department today for about a week of nasal congestion, cough, sore throat, rhinorrhea.  She has tried over-the-counter cold and flu medication with little relief.  She reports low-grade fevers.  No vomiting or diarrhea.  No known sick contacts.  No alleviating or aggravating factors    Past Medical History:  Diagnosis Date   Asthma    Depression    Endometriosis    Fibromyalgia    High cholesterol    History of salpingectomy    Right   Hypertension    RA (rheumatoid arthritis) ([place])     Patient Active Problem List   Diagnosis Date Noted   Hypertension, essential 08/05/2018   Elevated cholesterol 08/05/2018   Rheumatoid arthritis ([place]) 08/05/2018   Abnormal uterine bleeding (AUB) 04/20/2018    Past Surgical History:  Procedure Laterality Date   CERVICAL SPINE SURGERY     RIGHT OOPHORECTOMY Right 1995   SALPINGECTOMY Right 1995   TUBAL LIGATION       OB History     Gravida  5   Para  3   Term  2   Preterm  1   AB  2   Living  3      SAB  2   IAB      Ectopic      Multiple      Live Births  3           Family History  Problem Relation Age of Onset   Diabetes Mother    Hypertension Mother    Diabetes Sister    Hypertension Sister    Hypertension Brother     Social History   Tobacco Use   Smoking status: Never   Smokeless tobacco: Never  Vaping Use   Vaping Use: Never used  Substance Use Topics   Alcohol use: No   Drug use: No    Home Medications Prior to Admission medications   Medication Sig Start Date End Date Taking? Authorizing Provider  amoxicillin-clavulanate (AUGMENTIN) 875-125 MG tablet Take 1 tablet by  mouth every 12 (twelve) hours. 02/13/21  Yes Trennon Torbeck, Zack Seal, PA-C  benzonatate (TESSALON) 100 MG capsule Take 1 capsule (100 mg total) by mouth every 8 (eight) hours. 02/13/21  Yes Ailani Governale, Zack Seal, PA-C  predniSONE (DELTASONE) 20 MG tablet Take 2 tablets (40 mg total) by mouth daily for 5 days. 02/13/21 02/18/21 Yes Greig Altergott, Zack Seal, PA-C  albuterol (VENTOLIN HFA) 108 (90 Base) MCG/ACT inhaler INHALE 1 - 2 PUFFS INTO THE LUNGS EVERY 6 HOURS AS NEEDED FOR WHEEZING OR SHORTNESS OF BREATH 04/03/20 04/03/21  Long, Wonda Olds, MD  amitriptyline (ELAVIL) 25 MG tablet Take 25 mg by mouth at bedtime.    [provider]  amitriptyline (ELAVIL) 25 MG tablet TAKE 1 TABLET (25 MG TOTAL) BY MOUTH NIGHTLY. 05/02/20 05/02/21  Jenel Lucks, PA-C  aspirin 81 MG tablet Take 81 mg by mouth daily.    [provider]  cetirizine (ZYRTEC) 10 MG tablet TAKE 1 TABLET (10 MG TOTAL) BY MOUTH DAILY. 01/03/20 01/02/21  Tacy Learn, PA-C  chlorhexidine (PERIDEX) 0.12 % solution Take 3mls by mouth 2 times daily after  brushing teeth. Swish in mouth for 30 seconds then spit out 10/19/20     citalopram (CELEXA) 40 MG tablet Take 40 mg by mouth daily.    [provider]  citalopram (CELEXA) 40 MG tablet TAKE 1 TABLET (40 MG TOTAL) BY MOUTH DAILY. 05/02/20 05/02/21  Jenel Lucks, PA-C  diclofenac Sodium (VOLTAREN) 1 % GEL APPLY 2 GRAMS TOPICALLY 4 (FOUR) TIMES DAILY FOR 6 DAYS. 06/05/20 06/05/21  Fondaw, Kathleene Hazel, PA  fluticasone (FLONASE) 50 MCG/ACT nasal spray PLACE 1 SPRAY INTO BOTH NOSTRILS DAILY. 01/03/20 01/02/21  Suella Broad A, PA-C  gabapentin (NEURONTIN) 100 MG capsule TAKE 1 CAPSULE (100 MG TOTAL) BY MOUTH 3 TIMES DAILY. 06/06/20 06/06/21  Jenel Lucks, PA-C  gabapentin (NEURONTIN) 300 MG capsule TAKE 1 CAPSULE BY MOUTH AT BEDTIME FOR 3 DAYS THEN TAKE 1 CAPSULE 3 TIMES A DAY 06/23/20 06/23/21  Bonnee Quin, PA-C  hydrochlorothiazide (HYDRODIURIL) 25 MG tablet  Take by mouth. 05/27/18   [provider]  hydrochlorothiazide (HYDRODIURIL) 25 MG tablet TAKE 1 TABLET (25 MG TOTAL) BY MOUTH DAILY. 05/02/20 05/02/21  Jenel Lucks, PA-C  HYDROcodone-acetaminophen (NORCO/VICODIN) 5-325 MG tablet Take 1 tablet by mouth every 6 hours as needed for pain 10/19/20     LORazepam (ATIVAN) 2 MG tablet Take 1 tablet by mouth 1 hour pre-op with a small sip of water 10/19/20     meloxicam (MOBIC) 15 MG tablet TAKE 1 TABLET (15 MG TOTAL) BY MOUTH DAILY. 06/23/20 06/23/21  Bonnee Quin, PA-C  methocarbamol (ROBAXIN) 500 MG tablet Take 1 tablet (500 mg total) by mouth every 8 (eight) hours as needed for muscle spasms. 06/03/20   Rayna Sexton, PA-C  methocarbamol (ROBAXIN) 750 MG tablet TAKE 1 TABLET (750 MG TOTAL) BY MOUTH 3 TIMES DAILY FOR 10 DAYS. 06/06/20 06/06/21  Jenel Lucks, PA-C  metoprolol succinate (TOPROL-XL) 100 MG 24 hr tablet TAKE 1 TABLET (100 MG TOTAL) BY MOUTH EVERY MORNING. 05/02/20 05/02/21  Jenel Lucks, PA-C  metoprolol succinate (TOPROL-XL) 25 MG 24 hr tablet TAKE 1 TABLET (25 MG TOTAL) BY MOUTH DAILY WITH 100 MG TABLET. 05/02/20 05/02/21  Jenel Lucks, PA-C  metoprolol succinate (TOPROL-XL) 25 MG 24 hr tablet TAKE 1 TABLET BY MOUTH DAILY WITH 100MG  TABLET **NEEDS AN OFFICE VISIT** 02/09/20 02/08/21  Jenel Lucks, PA-C  metoprolol tartrate (LOPRESSOR) 100 MG tablet Take 100 mg by mouth 2 (two) times daily.    [provider]  neomycin-polymyxin-hydrocortisone (CORTISPORIN) 3.5-10000-1 ophthalmic suspension Place 1 drop into both eyes 4 times daily for 5 days. 01/03/21     Norethindrone Acetate-Ethinyl Estrad-FE (LOESTRIN 24 FE) 1-20 MG-MCG(24) tablet Take 1 tablet by mouth daily. Patient not taking: Reported on 10/12/2018 04/20/18   Lavonia Drafts, MD  Norethindrone Acetate-Ethinyl Estradiol (LOESTRIN) 1.5-30 MG-MCG tablet TAKE 1 TABLET BY MOUTH DAILY. 07/30/19 07/29/20  Lavonia Drafts, MD  ondansetron (ZOFRAN-ODT) 4 MG disintegrating tablet Place 1 tablet on the top of tongue where they will dissolve, then swallow every 8 hours as needed for nausea/vomiting 10/19/20     pantoprazole (PROTONIX) 20 MG tablet Take 20 mg by mouth daily.    [provider]  pantoprazole (PROTONIX) 20 MG tablet TAKE 1 TABLET (20 MG TOTAL) BY MOUTH DAILY. 05/02/20 05/02/21  Jenel Lucks, PA-C  pantoprazole (PROTONIX) 20 MG tablet TAKE 1 TABLET (20 MG TOTAL) BY MOUTH DAILY. **NEEDS AN OFFICE VISIT** 02/04/20 02/03/21  Jenel Lucks, PA-C  potassium chloride SA (KLOR-CON) 20 MEQ tablet Take  by mouth. 05/27/18   [provider]  potassium chloride SA (KLOR-CON) 20 MEQ tablet TAKE 1 TABLET (20 MEQ TOTAL) BY MOUTH DAILY. 05/02/20 05/02/21  Jenel Lucks, PA-C  ranitidine (ZANTAC) 150 MG capsule Take 150 mg by mouth every evening.    [provider]  simvastatin (ZOCOR) 40 MG tablet Take 40 mg by mouth at bedtime.      [provider]  simvastatin (ZOCOR) 40 MG tablet TAKE 1 TABLET (40 MG TOTAL) BY MOUTH DAILY. 05/02/20 05/02/21  Jenel Lucks, PA-C  simvastatin (ZOCOR) 40 MG tablet TAKE 1 TABLET BY MOUTH ONCE DAILY 05/24/19 05/23/20  Jenel Lucks, PA-C  tiZANidine (ZANAFLEX) 2 MG tablet TAKE 1/2 TO 1 TABLET BY MOUTH EVERY 6 HOURS AS NEEDED FOR BACK SPASM OR CRAMPING. 06/23/20 06/23/21  Bonnee Quin, PA-C  valACYclovir (VALTREX) 500 MG tablet TAKE 1 TABLET (500 MG TOTAL) BY MOUTH DAILY. 05/02/20 05/02/21  Jenel Lucks, PA-C    Allergies    Codeine, Nitrofurantoin, Sulfamethoxazole-trimethoprim, and Septra [sulfamethoxazole w/trimethoprim (co-trimoxazole)]  Review of Systems   Review of Systems  Constitutional:  Positive for chills and fever.  HENT:  Positive for congestion, rhinorrhea, sinus pressure and sore throat.   Eyes:  Negative for discharge.  Respiratory:  Positive for cough.   Gastrointestinal:   Negative for diarrhea and vomiting.  Musculoskeletal:  Negative for arthralgias and myalgias.  Skin:  Negative for color change.  Neurological:  Positive for headaches.  Psychiatric/Behavioral:  Negative for confusion.    Physical Exam Updated Vital Signs BP 122/67 (BP Location: Left Arm)   Pulse 63   Temp 98.3 F (36.8 C) (Oral)   Resp 18   Ht 5\' 3"  (1.6 m)   Wt 83.9 kg   LMP 12/26/2020   SpO2 98%   BMI 32.77 kg/m   Physical Exam Vitals and nursing note reviewed.  Constitutional:      General: She is not in acute distress.    Appearance: She is well-developed. She is not ill-appearing or toxic-appearing.  HENT:     Head: Normocephalic and atraumatic.     Right Ear: Tympanic membrane, ear canal and external ear normal.     Left Ear: Tympanic membrane, ear canal and external ear normal.     Nose: Congestion and rhinorrhea present.     Mouth/Throat:     Mouth: Mucous membranes are moist.     Pharynx: Oropharynx is clear.  Eyes:     General: No scleral icterus.       Right eye: No discharge.        Left eye: No discharge.  Cardiovascular:     Rate and Rhythm: Normal rate and regular rhythm.     Heart sounds: Normal heart sounds.  Pulmonary:     Effort: Pulmonary effort is normal. No respiratory distress.     Breath sounds: Wheezing present.  Musculoskeletal:        General: Normal range of motion.     Cervical back: Normal range of motion.  Skin:    Coloration: Skin is not pale.  Neurological:     Mental Status: She is alert.  Psychiatric:        Behavior: Behavior normal.        Thought Content: Thought content normal.        Judgment: Judgment normal.    ED Results / Procedures / Treatments   Labs (all labs ordered are listed, but only abnormal results are displayed) Labs Reviewed  RESP PANEL BY RT-PCR (FLU A&B, COVID) [id number]    EKG None  Radiology No results found.  Procedures Procedures   Medications Ordered in ED Medications - No data to  display  ED Course  I have reviewed the triage vital signs and the nursing notes.  Pertinent labs & imaging results that were available during my care of the patient were reviewed by me and considered in my medical decision making (see chart for details).    MDM Rules/Calculators/A&P                           Patient presents for week of upper respiratory tract infectious symptoms.  Patient is lungs with scattered wheezes noted.  Patient has no rhonchorous sounds appreciated.  No signs of otitis media.  Patient does not appear dehydrated.  Patient is not toxic or septic appearing.  Patient has been using her albuterol inhaler at home.  Given her symptoms we will treat antibiotics for likely sinusitis.  Patient also provided with steroids and cough medication.  I discussed primary care follow-up and symptomatic management at home.  Discussed reasons to return to the ER. Final Clinical Impression(s) / ED Diagnoses Final diagnoses:  Acute cough  Acute upper respiratory infection    Rx / DC Orders ED Discharge Orders          Ordered    amoxicillin-clavulanate (AUGMENTIN) 875-125 MG tablet  Every 12 hours        02/13/21 1702    benzonatate (TESSALON) 100 MG capsule  Every 8 hours        02/13/21 1702    predniSONE (DELTASONE) 20 MG tablet  Daily        02/13/21 [place], Elle Vezina T, PA-C 02/13/21 [place], Ankit, MD 02/15/21 [phone]

## 2021-02-13 NOTE — ED Triage Notes (Signed)
Pt c/o flu like sx x 6 days-NAD-steady gait

## 2021-02-24 MED FILL — Pantoprazole Sodium EC Tab 20 MG (Base Equiv): ORAL | 30 days supply | Qty: 30 | Fill #7 | Status: AC

## 2021-02-24 MED FILL — Hydrochlorothiazide Tab 25 MG: ORAL | 30 days supply | Qty: 30 | Fill #7 | Status: AC

## 2021-02-24 MED FILL — Citalopram Hydrobromide Tab 40 MG (Base Equiv): ORAL | 30 days supply | Qty: 30 | Fill #7 | Status: AC

## 2021-02-26 ENCOUNTER — Other Ambulatory Visit (HOSPITAL_BASED_OUTPATIENT_CLINIC_OR_DEPARTMENT_OTHER): Payer: Self-pay

## 2021-03-05 ENCOUNTER — Other Ambulatory Visit (HOSPITAL_BASED_OUTPATIENT_CLINIC_OR_DEPARTMENT_OTHER): Payer: Self-pay

## 2021-03-05 MED FILL — Metoprolol Succinate Tab ER 24HR 100 MG (Tartrate Equiv): ORAL | 30 days supply | Qty: 30 | Fill #8 | Status: AC

## 2021-03-06 MED FILL — Potassium Chloride Microencapsulated Crys ER Tab 20 mEq: ORAL | 30 days supply | Qty: 30 | Fill #2 | Status: AC

## 2021-03-06 MED FILL — Valacyclovir HCl Tab 500 MG: ORAL | 30 days supply | Qty: 30 | Fill #7 | Status: AC

## 2021-03-06 MED FILL — Metoprolol Succinate Tab ER 24HR 25 MG (Tartrate Equiv): ORAL | 30 days supply | Qty: 30 | Fill #8 | Status: AC

## 2021-03-07 ENCOUNTER — Other Ambulatory Visit (HOSPITAL_BASED_OUTPATIENT_CLINIC_OR_DEPARTMENT_OTHER): Payer: Self-pay

## 2021-03-27 ENCOUNTER — Other Ambulatory Visit (HOSPITAL_BASED_OUTPATIENT_CLINIC_OR_DEPARTMENT_OTHER): Payer: Self-pay

## 2021-03-27 MED FILL — Hydrochlorothiazide Tab 25 MG: ORAL | 30 days supply | Qty: 30 | Fill #8 | Status: AC

## 2021-03-27 MED FILL — Pantoprazole Sodium EC Tab 20 MG (Base Equiv): ORAL | 30 days supply | Qty: 30 | Fill #8 | Status: AC

## 2021-03-27 MED FILL — Citalopram Hydrobromide Tab 40 MG (Base Equiv): ORAL | 30 days supply | Qty: 30 | Fill #8 | Status: AC

## 2021-04-02 ENCOUNTER — Encounter: Payer: Self-pay | Admitting: Obstetrics & Gynecology

## 2021-04-02 ENCOUNTER — Ambulatory Visit (INDEPENDENT_AMBULATORY_CARE_PROVIDER_SITE_OTHER): Payer: No Typology Code available for payment source | Admitting: Obstetrics & Gynecology

## 2021-04-02 ENCOUNTER — Other Ambulatory Visit (HOSPITAL_COMMUNITY)
Admission: RE | Admit: 2021-04-02 | Discharge: 2021-04-02 | Disposition: A | Discharge status: Home / Self Care | Payer: No Typology Code available for payment source | Source: Ambulatory Visit | Attending: Obstetrics & Gynecology | Admitting: Obstetrics & Gynecology

## 2021-04-02 ENCOUNTER — Other Ambulatory Visit: Payer: Self-pay

## 2021-04-02 VITALS — BP 112/75 | HR 81 | Ht 63.0 in | Wt 181.0 lb

## 2021-04-02 DIAGNOSIS — Z1231 Encounter for screening mammogram for malignant neoplasm of breast: Secondary | ICD-10-CM | POA: Diagnosis not present

## 2021-04-02 DIAGNOSIS — R102 Pelvic and perineal pain: Secondary | ICD-10-CM

## 2021-04-02 DIAGNOSIS — Z01419 Encounter for gynecological examination (general) (routine) without abnormal findings: Secondary | ICD-10-CM | POA: Insufficient documentation

## 2021-04-02 DIAGNOSIS — Z1211 Encounter for screening for malignant neoplasm of colon: Secondary | ICD-10-CM | POA: Diagnosis not present

## 2021-04-02 DIAGNOSIS — R1013 Epigastric pain: Secondary | ICD-10-CM

## 2021-04-02 NOTE — Progress Notes (Signed)
GYNECOLOGY ANNUAL PREVENTATIVE CARE ENCOUNTER NOTE  History:     Katherine Wiggins is a 56 y.o. [phone] female here for a routine annual gynecologic exam.  Current complaints: no periods since 01/2021 but has worsening pelvic pain and upper abdominal pain, associated with headaches and nausea.  Wonders if this is due to being perimenopausal or if something else is wrong.   Denies abnormal vaginal bleeding, discharge, problems with intercourse or other gynecologic concerns.    Gynecologic History Patient's last menstrual period was 02/15/2021. Contraception: none Last Pap: 04/03/2018. Result was normal with negative HPV Last Mammogram: 04/07/2018.  Result was normal Last Colonoscopy: Never had one.  Obstetric History OB History  Gravida Para Term Preterm AB Living  5 3 2 1 2 3   SAB IAB Ectopic Multiple Live Births  2       3    # Outcome Date GA Lbr Len/2nd Weight Sex Delivery Anes PTL Lv  5 Term 1994 [redacted]w[redacted]d   M CS-LTranv EPI  LIV  4 Preterm 1992 [redacted]w[redacted]d   M Vag-Spont None  LIV  3 Term 59 [redacted]w[redacted]d   M Vag-Spont EPI  LIV  2 SAB 01/1987          1 SAB 07/1986            Past Medical History:  Diagnosis Date   Asthma    Depression    Endometriosis    Fibromyalgia    High cholesterol    History of salpingectomy    Right   Hypertension    RA (rheumatoid arthritis) ([place])     Past Surgical History:  Procedure Laterality Date   CERVICAL SPINE SURGERY     RIGHT OOPHORECTOMY Right 1995   SALPINGECTOMY Right 1995   TUBAL LIGATION      Current Outpatient Medications on File Prior to Visit  Medication Sig Dispense Refill   albuterol (VENTOLIN HFA) 108 (90 Base) MCG/ACT inhaler INHALE 1 - 2 PUFFS INTO THE LUNGS EVERY 6 HOURS AS NEEDED FOR WHEEZING OR SHORTNESS OF BREATH 18 g 0   amitriptyline (ELAVIL) 25 MG tablet TAKE 1 TABLET (25 MG TOTAL) BY MOUTH NIGHTLY. 90 tablet 3   aspirin 81 MG tablet Take 81 mg by mouth daily.     citalopram (CELEXA) 40 MG tablet TAKE 1 TABLET (40 MG  TOTAL) BY MOUTH DAILY. 90 tablet 3   diclofenac Sodium (VOLTAREN) 1 % GEL APPLY 2 GRAMS TOPICALLY 4 (FOUR) TIMES DAILY FOR 6 DAYS. 100 g 0   hydrochlorothiazide (HYDRODIURIL) 25 MG tablet TAKE 1 TABLET (25 MG TOTAL) BY MOUTH DAILY. 90 tablet 3   LORazepam (ATIVAN) 2 MG tablet Take 1 tablet by mouth 1 hour pre-op with a small sip of water 1 tablet 0   metoprolol succinate (TOPROL-XL) 100 MG 24 hr tablet TAKE 1 TABLET (100 MG TOTAL) BY MOUTH EVERY MORNING. 90 tablet 3   metoprolol tartrate (LOPRESSOR) 100 MG tablet Take 100 mg by mouth 2 (two) times daily.     ondansetron (ZOFRAN-ODT) 4 MG disintegrating tablet Place 1 tablet on the top of tongue where they will dissolve, then swallow every 8 hours as needed for nausea/vomiting 6 tablet 0   pantoprazole (PROTONIX) 20 MG tablet TAKE 1 TABLET (20 MG TOTAL) BY MOUTH DAILY. 90 tablet 3   potassium chloride SA (KLOR-CON M) 20 MEQ tablet TAKE 1 TABLET (20 MEQ TOTAL) BY MOUTH DAILY. 90 tablet 3   simvastatin (ZOCOR) 40 MG tablet TAKE 1 TABLET (40 MG  TOTAL) BY MOUTH DAILY. 90 tablet 3   cetirizine (ZYRTEC) 10 MG tablet TAKE 1 TABLET (10 MG TOTAL) BY MOUTH DAILY. 100 tablet 0   fluticasone (FLONASE) 50 MCG/ACT nasal spray PLACE 1 SPRAY INTO BOTH NOSTRILS DAILY. 16 g 2   hydrochlorothiazide (HYDRODIURIL) 25 MG tablet Take by mouth. (Patient not taking: Reported on 04/02/2021)     metoprolol succinate (TOPROL-XL) 25 MG 24 hr tablet TAKE 1 TABLET BY MOUTH DAILY WITH 100MG  TABLET **NEEDS AN OFFICE VISIT** 90 tablet 0   Norethindrone Acetate-Ethinyl Estradiol (LOESTRIN) 1.5-30 MG-MCG tablet TAKE 1 TABLET BY MOUTH DAILY. 21 tablet 11   pantoprazole (PROTONIX) 20 MG tablet TAKE 1 TABLET (20 MG TOTAL) BY MOUTH DAILY. **NEEDS AN OFFICE VISIT** 90 tablet 0   simvastatin (ZOCOR) 40 MG tablet TAKE 1 TABLET BY MOUTH ONCE DAILY 90 tablet 3   No current facility-administered medications on file prior to visit.    Allergies  Allergen Reactions   Codeine Hives    Vision  becomes blurry  Vision becomes blurry  Vision becomes blurry   Vision becomes blurry  Vision becomes blurry  Vision becomes blurry  Vision becomes blurry    Nitrofurantoin Hives    Vision becomes blurry  Other reaction(s): Other Vision becomes blurry  Vision becomes blurry  Vision becomes blurry  Vision becomes blurry  Vision becomes blurry     Sulfamethoxazole-Trimethoprim Hives    Vision becomes blurry  Vision becomes blurry  Vision becomes blurry  Vision becomes blurry     Septra [Sulfamethoxazole W/Trimethoprim (Co-Trimoxazole)] Hives    Vision becomes blurry     Social History:  reports that she has never smoked. She has never used smokeless tobacco. She reports that she does not drink alcohol and does not use drugs.  Family History  Problem Relation Age of Onset   Diabetes Mother    Hypertension Mother    Diabetes Sister    Hypertension Sister    Hypertension Brother     The following portions of the patient's history were reviewed and updated as appropriate: allergies, current medications, past family history, past medical history, past social history, past surgical history and problem list.  Review of Systems Pertinent items noted in HPI and remainder of comprehensive ROS otherwise negative.  Physical Exam:  BP 112/75    Pulse 81    Ht 5\' 3"  (1.6 m)    Wt 181 lb (82.1 kg)    LMP 02/15/2021    BMI 32.06 kg/m  CONSTITUTIONAL: Well-developed, well-nourished female in no acute distress.  HENT:  Normocephalic, atraumatic, External right and left ear normal.  EYES: Conjunctivae and EOM are normal. Pupils are equal, round, and reactive to light. No scleral icterus.  NECK: Normal range of motion, supple, no masses.  Normal thyroid.  SKIN: Skin is warm and dry. No rash noted. Not diaphoretic. No erythema. No pallor. MUSCULOSKELETAL: Normal range of motion. No tenderness.  No cyanosis, clubbing, or edema. NEUROLOGIC: Alert and oriented to person, place, and time.  Normal reflexes, muscle tone coordination.  PSYCHIATRIC: Normal mood and affect. Normal behavior. Normal judgment and thought content. CARDIOVASCULAR: Normal heart rate noted, regular rhythm RESPIRATORY: Clear to auscultation bilaterally. Effort and breath sounds normal, no problems with respiration noted. BREASTS: Symmetric in size. No masses, tenderness, skin changes, nipple drainage, or lymphadenopathy bilaterally. Performed in the presence of a chaperone. ABDOMEN: Soft, no distention noted.  Significant epigastric tenderness on palpation, also has lower abdominal tenderness. No rebound or guarding.  PELVIC: Normal  appearing external genitalia and urethral meatus; normal appearing vaginal mucosa and cervix.  No abnormal vaginal discharge noted.  Pap smear obtained.  Normal uterine size, no other palpable masses but she had significant uterine tenderness on palpation.  Performed in the presence of a chaperone.   Assessment and Plan:    1. Pelvic pain in female Unsure of etiology of her pain, ultrasound ordered. Ancillary infection testing done from pap smear too.  Will follow up results and manage accordingly.  - US PELVIC COMPLETE WITH TRANSVAGINAL; Future - Cytology - PAP  2. Epigastric abdominal pain Worried about gastritis, GERD or other etiology given her symptoms. Recommended to follow up with her PCP or GI specialist for further evaluation.  Recommended trial of antacids in the interim.   I spent 20 minutes dedicated to the care of this patient discussing her pelvic and abdominal pain including pre-visit review of records, face to face time with the patient discussing her conditions and treatments and post visit ordering of testing.  3. Screening for colon cancer Referral made to Gastroenterology for colonoscopy - Ambulatory referral to Gastroenterology  4. Breast cancer screening by mammogram Mammogram scheduled - MM 3D SCREEN BREAST BILATERAL; Future  5. Well woman exam with  routine gynecological exam - Cytology - PAP Will follow up results of pap smear and manage accordingly. Routine preventative health maintenance measures emphasized. Please refer to After Visit Summary for other counseling recommendations.      Verita Schneiders, MD, [place] for [employer], [place]

## 2021-04-03 ENCOUNTER — Other Ambulatory Visit (HOSPITAL_BASED_OUTPATIENT_CLINIC_OR_DEPARTMENT_OTHER): Payer: Self-pay

## 2021-04-03 MED ORDER — VALACYCLOVIR HCL 500 MG PO TABS
ORAL_TABLET | ORAL | 0 refills | Status: DC
  Filled 2021-04-03: qty 30, 30d supply, fill #0
  Filled 2021-05-17: qty 30, 30d supply, fill #1

## 2021-04-03 MED ORDER — METOPROLOL SUCCINATE ER 25 MG PO TB24
ORAL_TABLET | ORAL | 0 refills | Status: DC
  Filled 2021-04-03: qty 30, 30d supply, fill #0
  Filled 2021-05-03: qty 30, 30d supply, fill #1

## 2021-04-03 MED FILL — Metoprolol Succinate Tab ER 24HR 100 MG (Tartrate Equiv): ORAL | 30 days supply | Qty: 30 | Fill #9 | Status: AC

## 2021-04-03 MED FILL — Amitriptyline HCl Tab 25 MG: ORAL | 90 days supply | Qty: 90 | Fill #0 | Status: AC

## 2021-04-04 LAB — CYTOLOGY - PAP
Chlamydia: NEGATIVE
Comment: NEGATIVE
Comment: NEGATIVE
Comment: NEGATIVE
Comment: NORMAL
Diagnosis: NEGATIVE
Diagnosis: REACTIVE
High risk HPV: NEGATIVE
Neisseria Gonorrhea: NEGATIVE
Trichomonas: NEGATIVE

## 2021-04-06 ENCOUNTER — Ambulatory Visit (HOSPITAL_BASED_OUTPATIENT_CLINIC_OR_DEPARTMENT_OTHER): Admission: RE | Admit: 2021-04-06 | Payer: No Typology Code available for payment source | Source: Ambulatory Visit

## 2021-04-06 ENCOUNTER — Ambulatory Visit (HOSPITAL_BASED_OUTPATIENT_CLINIC_OR_DEPARTMENT_OTHER): Payer: No Typology Code available for payment source

## 2021-04-09 ENCOUNTER — Other Ambulatory Visit (HOSPITAL_BASED_OUTPATIENT_CLINIC_OR_DEPARTMENT_OTHER): Payer: Self-pay

## 2021-04-13 ENCOUNTER — Other Ambulatory Visit (HOSPITAL_BASED_OUTPATIENT_CLINIC_OR_DEPARTMENT_OTHER): Payer: Self-pay

## 2021-04-13 MED FILL — Simvastatin Tab 40 MG: ORAL | 30 days supply | Qty: 30 | Fill #5 | Status: AC

## 2021-04-16 ENCOUNTER — Other Ambulatory Visit (HOSPITAL_BASED_OUTPATIENT_CLINIC_OR_DEPARTMENT_OTHER): Payer: Self-pay

## 2021-04-16 MED ORDER — AMOXICILLIN 500 MG PO CAPS
500.0000 mg | ORAL_CAPSULE | Freq: Three times a day (TID) | ORAL | 0 refills | Status: AC
  Filled 2021-04-16: qty 21, 7d supply, fill #0

## 2021-04-27 ENCOUNTER — Other Ambulatory Visit (HOSPITAL_BASED_OUTPATIENT_CLINIC_OR_DEPARTMENT_OTHER): Payer: Self-pay

## 2021-04-27 MED FILL — Pantoprazole Sodium EC Tab 20 MG (Base Equiv): ORAL | 30 days supply | Qty: 30 | Fill #9 | Status: AC

## 2021-04-27 MED FILL — Hydrochlorothiazide Tab 25 MG: ORAL | 30 days supply | Qty: 30 | Fill #9 | Status: AC

## 2021-04-27 MED FILL — Potassium Chloride Microencapsulated Crys ER Tab 20 mEq: ORAL | 30 days supply | Qty: 30 | Fill #3 | Status: AC

## 2021-04-27 MED FILL — Citalopram Hydrobromide Tab 40 MG (Base Equiv): ORAL | 30 days supply | Qty: 30 | Fill #9 | Status: AC

## 2021-04-28 ENCOUNTER — Telehealth (HOSPITAL_BASED_OUTPATIENT_CLINIC_OR_DEPARTMENT_OTHER): Payer: Self-pay

## 2021-05-01 ENCOUNTER — Emergency Department (HOSPITAL_BASED_OUTPATIENT_CLINIC_OR_DEPARTMENT_OTHER): Payer: No Typology Code available for payment source

## 2021-05-01 ENCOUNTER — Other Ambulatory Visit: Payer: Self-pay

## 2021-05-01 ENCOUNTER — Emergency Department (HOSPITAL_BASED_OUTPATIENT_CLINIC_OR_DEPARTMENT_OTHER)
Admission: EM | Admit: 2021-05-01 | Discharge: 2021-05-01 | Disposition: A | Discharge status: Home / Self Care | Payer: No Typology Code available for payment source | Attending: Emergency Medicine | Admitting: Emergency Medicine

## 2021-05-01 ENCOUNTER — Encounter (HOSPITAL_BASED_OUTPATIENT_CLINIC_OR_DEPARTMENT_OTHER): Payer: Self-pay | Admitting: *Deleted

## 2021-05-01 ENCOUNTER — Other Ambulatory Visit (HOSPITAL_BASED_OUTPATIENT_CLINIC_OR_DEPARTMENT_OTHER): Payer: Self-pay

## 2021-05-01 DIAGNOSIS — M069 Rheumatoid arthritis, unspecified: Secondary | ICD-10-CM | POA: Diagnosis not present

## 2021-05-01 DIAGNOSIS — Z7982 Long term (current) use of aspirin: Secondary | ICD-10-CM | POA: Insufficient documentation

## 2021-05-01 DIAGNOSIS — M25571 Pain in right ankle and joints of right foot: Secondary | ICD-10-CM | POA: Insufficient documentation

## 2021-05-01 MED ORDER — PREDNISONE 50 MG PO TABS
50.0000 mg | ORAL_TABLET | Freq: Every day | ORAL | 0 refills | Status: DC
  Filled 2021-05-01: qty 5, 5d supply, fill #0

## 2021-05-01 NOTE — ED Triage Notes (Signed)
Right ankle pain with swelling x 2 days. No known injury. Painful to walk.

## 2021-05-01 NOTE — ED Provider Notes (Signed)
[facility] EMERGENCY DEPARTMENT Provider Note   CSN: [id number] Arrival date & time: 05/01/21  1454     History  Chief Complaint  Patient presents with   Ankle Pain    Katherine Wiggins is a 56 y.o. female with a PMH of RA, fibromyalgia who presents with acute onset right ankle pain for the last two days. Patient reports she woke up Sunday morning with pain and swelling with no known new injury. Has been taking Ibuprofen 600mg  q3hours for pain with minimal relief. No hx of gout, pseudogout, no hx of injury to same ankle. Denies being on feet frequently. Not currently on suppressive steroids, does take amitriptyline.   Ankle Pain     Home Medications Prior to Admission medications   Medication Sig Start Date End Date Taking? Authorizing Provider  predniSONE (DELTASONE) 50 MG tablet Take 1 tablet (50 mg total) by mouth daily. 05/01/21  Yes Eliazer Hemphill H, PA-C  albuterol (VENTOLIN HFA) 108 (90 Base) MCG/ACT inhaler INHALE 1 - 2 PUFFS INTO THE LUNGS EVERY 6 HOURS AS NEEDED FOR WHEEZING OR SHORTNESS OF BREATH 04/03/20 04/03/21  Long, Wonda Olds, MD  amitriptyline (ELAVIL) 25 MG tablet TAKE 1 TABLET (25 MG TOTAL) BY MOUTH NIGHTLY. 05/02/20 07/04/21  Jenel Lucks, PA-C  amoxicillin (AMOXIL) 500 MG capsule Take 1 capsule (500 mg total) by mouth three times a day with food until gone. 04/16/21     aspirin 81 MG tablet Take 81 mg by mouth daily.    [provider]  cetirizine (ZYRTEC) 10 MG tablet TAKE 1 TABLET (10 MG TOTAL) BY MOUTH DAILY. 01/03/20 01/02/21  Tacy Learn, PA-C  citalopram (CELEXA) 40 MG tablet TAKE 1 TABLET (40 MG TOTAL) BY MOUTH DAILY. 05/02/20 05/28/21  Jenel Lucks, PA-C  diclofenac Sodium (VOLTAREN) 1 % GEL APPLY 2 GRAMS TOPICALLY 4 (FOUR) TIMES DAILY FOR 6 DAYS. 06/05/20 06/05/21  Fondaw, Kathleene Hazel, PA  fluticasone (FLONASE) 50 MCG/ACT nasal spray PLACE 1 SPRAY INTO BOTH NOSTRILS DAILY. 01/03/20 01/02/21  Tacy Learn, PA-C   hydrochlorothiazide (HYDRODIURIL) 25 MG tablet Take by mouth. Patient not taking: Reported on 04/02/2021 05/27/18   [provider]  hydrochlorothiazide (HYDRODIURIL) 25 MG tablet TAKE 1 TABLET (25 MG TOTAL) BY MOUTH DAILY. 05/02/20 05/28/21  Jenel Lucks, PA-C  LORazepam (ATIVAN) 2 MG tablet Take 1 tablet by mouth 1 hour pre-op with a small sip of water 10/19/20     metoprolol succinate (TOPROL-XL) 100 MG 24 hr tablet TAKE 1 TABLET (100 MG TOTAL) BY MOUTH EVERY MORNING. 05/02/20 05/05/21  Jenel Lucks, PA-C  metoprolol succinate (TOPROL-XL) 25 MG 24 hr tablet TAKE 1 TABLET BY MOUTH DAILY WITH 100MG  TABLET **NEEDS AN OFFICE VISIT** 02/09/20 02/08/21  Jenel Lucks, PA-C  metoprolol succinate (TOPROL-XL) 25 MG 24 hr tablet TAKE 1 TABLET (25 MG TOTAL) BY MOUTH DAILY WITH 100 MG TABLET. **NO MORE REFILLS-NEEDS AN OFFICE VISIT** 04/03/21     metoprolol tartrate (LOPRESSOR) 100 MG tablet Take 100 mg by mouth 2 (two) times daily.    [provider]  Norethindrone Acetate-Ethinyl Estradiol (LOESTRIN) 1.5-30 MG-MCG tablet TAKE 1 TABLET BY MOUTH DAILY. 07/30/19 07/29/20  Lavonia Drafts, MD  ondansetron (ZOFRAN-ODT) 4 MG disintegrating tablet Place 1 tablet on the top of tongue where they will dissolve, then swallow every 8 hours as needed for nausea/vomiting 10/19/20     pantoprazole (PROTONIX) 20 MG tablet TAKE 1 TABLET (20 MG TOTAL) BY MOUTH DAILY. 05/02/20 05/28/21  Rayburn Go,  Cleda Clarks, PA-C  pantoprazole (PROTONIX) 20 MG tablet TAKE 1 TABLET (20 MG TOTAL) BY MOUTH DAILY. **NEEDS AN OFFICE VISIT** 02/04/20 02/03/21  Jenel Lucks, PA-C  potassium chloride SA (KLOR-CON M) 20 MEQ tablet TAKE 1 TABLET (20 MEQ TOTAL) BY MOUTH DAILY. 05/02/20 05/27/21  Jenel Lucks, PA-C  simvastatin (ZOCOR) 40 MG tablet TAKE 1 TABLET (40 MG TOTAL) BY MOUTH DAILY. 05/02/20 05/13/21  Jenel Lucks, PA-C  simvastatin (ZOCOR) 40 MG tablet TAKE 1  TABLET BY MOUTH ONCE DAILY 05/24/19 05/23/20  Jenel Lucks, PA-C  valACYclovir (VALTREX) 500 MG tablet Take 1 tablet (500 mg total) by mouth daily. **NO MORE REFILLS-NEEDS AN OFFICE VISIT** 04/03/21         Allergies    Codeine, Nitrofurantoin, Sulfamethoxazole-trimethoprim, and Septra [sulfamethoxazole w/trimethoprim (co-trimoxazole)]    Review of Systems   Review of Systems  Musculoskeletal:  Positive for joint swelling.  All other systems reviewed and are negative.  Physical Exam Updated Vital Signs BP 129/68 (BP Location: Right Arm)    Pulse 65    Temp 98.1 F (36.7 C) (Oral)    Resp 18    Ht 5\' 3"  (1.6 m)    Wt 82.1 kg    LMP 02/15/2021    SpO2 99%    BMI 32.06 kg/m  Physical Exam Vitals and nursing note reviewed.  Constitutional:      General: She is not in acute distress.    Appearance: Normal appearance.  HENT:     Head: Normocephalic and atraumatic.  Eyes:     General:        Right eye: No discharge.        Left eye: No discharge.  Cardiovascular:     Rate and Rhythm: Normal rate and regular rhythm.     Pulses: Normal pulses.     Comments: Intact DP/PT pulses on right Pulmonary:     Effort: Pulmonary effort is normal. No respiratory distress.  Musculoskeletal:        General: No deformity.     Comments: Patient with intact strenght / ROM of entire RLE, ttp on bilateral sides of left ankle extending from distal tib/fib down to right midfoot. No evidence of joint effusion, step-off, deformity.  Skin:    General: Skin is warm and dry.  Neurological:     Mental Status: She is alert and oriented to person, place, and time.  Psychiatric:        Mood and Affect: Mood normal.        Behavior: Behavior normal.    ED Results / Procedures / Treatments   Labs (all labs ordered are listed, but only abnormal results are displayed) Labs Reviewed - No data to display  EKG None  Radiology DG Ankle Complete Right  Result Date: 05/01/2021 CLINICAL DATA:  Ankle  pain EXAM: RIGHT ANKLE - COMPLETE 3+ VIEW COMPARISON:  None. FINDINGS: Frontal, oblique, lateral views of the right ankle are obtained. No acute fracture, subluxation, or dislocation. Joint spaces are well preserved. Small inferior calcaneal spur. Small osteophyte off the dorsal proximal aspect of the tarsal navicular. Soft tissues are unremarkable. IMPRESSION: 1. No acute displaced fracture. Electronically Signed   By: Randa Ngo M.D.   On: 05/01/2021 15:24    Procedures Procedures    Medications Ordered in ED Medications - No data to display  ED Course/ Medical Decision Making/ A&P  Medical Decision Making Amount and/or Complexity of Data Reviewed Radiology: ordered.   This is a patient with acute onset right ankle pain without known injury. The emergent dfx diagnosis includes septic arthritis, acute fracture, dislocation, RA, osteoarthritis, gout, pseudogout or other sprain / generalized inflammation. Entire RLE is neurovascularly intact. I see no evidence of septic joint. No redness, overlying wound, intact ROM. With hx of RA, I believe this is the most likely diagnosis.  I personally interpreted radiographic imaging of the RLE which shows no evidence of fracture, dislocation or effusion. We will supply right ankle brace, encourage RICE, and place patient on short prednisone burst. Encouraged ibuprofen / tylenol but discussed appropriate dosage for ibuprofen. Patient understands and agrees to plan, discharged in stable condition at this time. Final Clinical Impression(s) / ED Diagnoses Final diagnoses:  Rheumatoid arthritis flare ([place])  Acute right ankle pain    Rx / DC Orders ED Discharge Orders          Ordered    predniSONE (DELTASONE) 50 MG tablet  Daily        05/01/21 1532              Anselmo Pickler, PA-C 05/01/21 1533    Hayden Rasmussen, MD 05/02/21 [phone]

## 2021-05-01 NOTE — Discharge Instructions (Addendum)
Please use Tylenol or ibuprofen for pain.  You may use 600 mg ibuprofen every 6 hours or 1000 mg of Tylenol every 6 hours.  You may choose to alternate between the 2.  This would be most effective.  Not to exceed 4 g of Tylenol within 24 hours.  Not to exceed 3200 mg ibuprofen 24 hours.  I recommend you use ice, rest, elevate the affected limb, and keep compression on the affected limb.

## 2021-05-03 ENCOUNTER — Other Ambulatory Visit (HOSPITAL_BASED_OUTPATIENT_CLINIC_OR_DEPARTMENT_OTHER): Payer: Self-pay

## 2021-05-14 ENCOUNTER — Encounter: Payer: Self-pay | Admitting: Gastroenterology

## 2021-05-17 ENCOUNTER — Other Ambulatory Visit (HOSPITAL_BASED_OUTPATIENT_CLINIC_OR_DEPARTMENT_OTHER): Payer: Self-pay

## 2021-05-17 MED ORDER — METOPROLOL SUCCINATE ER 25 MG PO TB24
ORAL_TABLET | ORAL | 0 refills | Status: DC
  Filled 2021-05-17 – 2021-06-14 (×2): qty 30, 30d supply, fill #0

## 2021-05-20 ENCOUNTER — Other Ambulatory Visit (HOSPITAL_BASED_OUTPATIENT_CLINIC_OR_DEPARTMENT_OTHER): Payer: Self-pay

## 2021-05-21 ENCOUNTER — Other Ambulatory Visit (HOSPITAL_BASED_OUTPATIENT_CLINIC_OR_DEPARTMENT_OTHER): Payer: Self-pay

## 2021-05-21 MED ORDER — METOPROLOL SUCCINATE ER 100 MG PO TB24
ORAL_TABLET | ORAL | 0 refills | Status: AC
  Filled 2021-05-21: qty 30, 30d supply, fill #0
  Filled 2021-09-21: qty 30, 30d supply, fill #1
  Filled 2022-03-21 – 2022-03-22 (×2): qty 30, 30d supply, fill #2

## 2021-05-21 MED ORDER — SIMVASTATIN 40 MG PO TABS
ORAL_TABLET | ORAL | 0 refills | Status: DC
  Filled 2021-05-21: qty 30, 30d supply, fill #0
  Filled 2021-07-05: qty 30, 30d supply, fill #1
  Filled 2021-08-13: qty 30, 30d supply, fill #2

## 2021-05-23 ENCOUNTER — Other Ambulatory Visit (HOSPITAL_BASED_OUTPATIENT_CLINIC_OR_DEPARTMENT_OTHER): Payer: Self-pay

## 2021-05-28 ENCOUNTER — Other Ambulatory Visit (HOSPITAL_BASED_OUTPATIENT_CLINIC_OR_DEPARTMENT_OTHER): Payer: Self-pay

## 2021-05-29 ENCOUNTER — Other Ambulatory Visit (HOSPITAL_BASED_OUTPATIENT_CLINIC_OR_DEPARTMENT_OTHER): Payer: Self-pay

## 2021-05-30 ENCOUNTER — Other Ambulatory Visit (HOSPITAL_BASED_OUTPATIENT_CLINIC_OR_DEPARTMENT_OTHER): Payer: Self-pay

## 2021-05-30 ENCOUNTER — Ambulatory Visit: Payer: No Typology Code available for payment source | Admitting: Gastroenterology

## 2021-05-30 MED ORDER — HYDROCHLOROTHIAZIDE 25 MG PO TABS
ORAL_TABLET | ORAL | 0 refills | Status: DC
  Filled 2021-05-30: qty 30, 30d supply, fill #0
  Filled 2021-07-05: qty 30, 30d supply, fill #1
  Filled 2021-08-05: qty 30, 30d supply, fill #2

## 2021-05-30 MED ORDER — PANTOPRAZOLE SODIUM 20 MG PO TBEC
DELAYED_RELEASE_TABLET | ORAL | 0 refills | Status: DC
  Filled 2021-05-30: qty 30, 30d supply, fill #0
  Filled 2021-07-05: qty 30, 30d supply, fill #1
  Filled 2021-07-23 – 2021-07-30 (×3): qty 30, 30d supply, fill #2

## 2021-06-05 ENCOUNTER — Other Ambulatory Visit (HOSPITAL_BASED_OUTPATIENT_CLINIC_OR_DEPARTMENT_OTHER): Payer: Self-pay

## 2021-06-05 MED ORDER — KETOROLAC TROMETHAMINE 0.5 % OP SOLN
OPHTHALMIC | 1 refills | Status: AC
  Filled 2021-06-05 – 2021-08-28 (×2): qty 5, 25d supply, fill #0

## 2021-06-05 MED ORDER — PREDNISOLONE ACETATE 1 % OP SUSP
OPHTHALMIC | 1 refills | Status: AC
  Filled 2021-06-05 – 2021-08-28 (×2): qty 5, 25d supply, fill #0

## 2021-06-05 MED ORDER — GATIFLOXACIN 0.5 % OP SOLN
OPHTHALMIC | 1 refills | Status: AC
  Filled 2021-06-05 – 2021-08-28 (×2): qty 5, 25d supply, fill #0

## 2021-06-06 ENCOUNTER — Other Ambulatory Visit (HOSPITAL_BASED_OUTPATIENT_CLINIC_OR_DEPARTMENT_OTHER): Payer: Self-pay

## 2021-06-07 ENCOUNTER — Other Ambulatory Visit (HOSPITAL_BASED_OUTPATIENT_CLINIC_OR_DEPARTMENT_OTHER): Payer: Self-pay

## 2021-06-08 ENCOUNTER — Other Ambulatory Visit (HOSPITAL_BASED_OUTPATIENT_CLINIC_OR_DEPARTMENT_OTHER): Payer: Self-pay

## 2021-06-08 MED ORDER — CITALOPRAM HYDROBROMIDE 40 MG PO TABS
ORAL_TABLET | ORAL | 0 refills | Status: DC
  Filled 2021-06-08: qty 30, 30d supply, fill #0
  Filled 2021-07-15: qty 30, 30d supply, fill #1
  Filled 2021-08-12: qty 30, 30d supply, fill #2

## 2021-06-14 ENCOUNTER — Other Ambulatory Visit (HOSPITAL_BASED_OUTPATIENT_CLINIC_OR_DEPARTMENT_OTHER): Payer: Self-pay

## 2021-06-15 ENCOUNTER — Other Ambulatory Visit (HOSPITAL_BASED_OUTPATIENT_CLINIC_OR_DEPARTMENT_OTHER): Payer: Self-pay

## 2021-06-21 ENCOUNTER — Other Ambulatory Visit (HOSPITAL_BASED_OUTPATIENT_CLINIC_OR_DEPARTMENT_OTHER): Payer: Self-pay

## 2021-06-21 MED ORDER — AMITRIPTYLINE HCL 25 MG PO TABS
ORAL_TABLET | ORAL | 3 refills | Status: AC
  Filled 2021-06-21: qty 30, 30d supply, fill #0
  Filled 2021-10-26: qty 30, 30d supply, fill #1
  Filled 2021-11-25: qty 30, 30d supply, fill #2
  Filled 2021-12-31: qty 30, 30d supply, fill #3
  Filled 2022-03-26: qty 30, 30d supply, fill #4

## 2021-06-21 MED ORDER — ALBUTEROL SULFATE HFA 108 (90 BASE) MCG/ACT IN AERS
INHALATION_SPRAY | RESPIRATORY_TRACT | 5 refills | Status: AC
  Filled 2021-06-21: qty 8.5, 25d supply, fill #0
  Filled 2021-11-25: qty 6.7, 25d supply, fill #1

## 2021-06-21 MED ORDER — VALACYCLOVIR HCL 500 MG PO TABS
500.0000 mg | ORAL_TABLET | Freq: Every day | ORAL | 3 refills | Status: DC
  Filled 2021-06-21: qty 30, 30d supply, fill #0
  Filled 2021-07-23: qty 30, 30d supply, fill #1
  Filled 2021-08-21: qty 30, 30d supply, fill #2
  Filled 2021-09-27: qty 30, 30d supply, fill #3
  Filled 2021-11-01: qty 30, 30d supply, fill #4
  Filled 2021-12-02: qty 30, 30d supply, fill #5
  Filled 2021-12-31: qty 30, 30d supply, fill #6
  Filled 2022-02-07: qty 30, 30d supply, fill #7
  Filled 2022-03-07: qty 30, 30d supply, fill #8
  Filled 2022-04-12: qty 30, 30d supply, fill #9
  Filled 2022-05-13: qty 30, 30d supply, fill #10
  Filled 2022-06-11: qty 30, 30d supply, fill #11

## 2021-06-21 MED ORDER — METOPROLOL SUCCINATE ER 100 MG PO TB24
ORAL_TABLET | ORAL | 3 refills | Status: DC
  Filled 2021-06-21: qty 30, 30d supply, fill #0
  Filled 2021-07-23: qty 30, 30d supply, fill #1
  Filled 2021-08-21: qty 30, 30d supply, fill #2
  Filled 2021-10-22: qty 30, 30d supply, fill #3
  Filled 2021-11-18: qty 30, 30d supply, fill #4
  Filled 2021-12-20: qty 30, 30d supply, fill #5
  Filled 2022-01-16: qty 30, 30d supply, fill #6
  Filled 2022-02-16: qty 30, 30d supply, fill #7
  Filled 2022-04-23: qty 30, 30d supply, fill #8
  Filled 2022-05-19: qty 30, 30d supply, fill #9
  Filled 2022-06-19: qty 30, 30d supply, fill #10

## 2021-06-21 MED ORDER — POTASSIUM CHLORIDE CRYS ER 20 MEQ PO TBCR
20.0000 meq | EXTENDED_RELEASE_TABLET | Freq: Every day | ORAL | 3 refills | Status: DC
  Filled 2021-06-21: qty 30, 30d supply, fill #0
  Filled 2021-10-26: qty 30, 30d supply, fill #1
  Filled 2021-11-25: qty 30, 30d supply, fill #2
  Filled 2021-12-26: qty 30, 30d supply, fill #3
  Filled 2022-02-24: qty 30, 30d supply, fill #4
  Filled 2022-03-26: qty 30, 30d supply, fill #5
  Filled 2022-05-09: qty 30, 30d supply, fill #6
  Filled 2022-06-10: qty 30, 30d supply, fill #7

## 2021-06-21 MED ORDER — METOPROLOL SUCCINATE ER 25 MG PO TB24
ORAL_TABLET | ORAL | 3 refills | Status: AC
  Filled 2021-06-21 – 2021-07-15 (×2): qty 30, 30d supply, fill #0
  Filled 2021-08-12: qty 30, 30d supply, fill #1
  Filled 2021-09-16: qty 30, 30d supply, fill #2
  Filled 2021-10-22: qty 30, 30d supply, fill #3
  Filled 2021-11-18: qty 30, 30d supply, fill #4
  Filled 2021-12-20: qty 6, 6d supply, fill #5
  Filled 2021-12-20: qty 24, 24d supply, fill #5
  Filled 2022-01-16: qty 30, 30d supply, fill #6
  Filled 2022-02-16: qty 30, 30d supply, fill #7
  Filled 2022-03-21 – 2022-03-22 (×2): qty 30, 30d supply, fill #8
  Filled 2022-04-23: qty 30, 30d supply, fill #9

## 2021-07-05 ENCOUNTER — Other Ambulatory Visit (HOSPITAL_BASED_OUTPATIENT_CLINIC_OR_DEPARTMENT_OTHER): Payer: Self-pay

## 2021-07-16 ENCOUNTER — Other Ambulatory Visit (HOSPITAL_BASED_OUTPATIENT_CLINIC_OR_DEPARTMENT_OTHER): Payer: Self-pay

## 2021-07-23 ENCOUNTER — Other Ambulatory Visit (HOSPITAL_BASED_OUTPATIENT_CLINIC_OR_DEPARTMENT_OTHER): Payer: Self-pay

## 2021-07-30 ENCOUNTER — Other Ambulatory Visit (HOSPITAL_BASED_OUTPATIENT_CLINIC_OR_DEPARTMENT_OTHER): Payer: Self-pay

## 2021-08-06 ENCOUNTER — Other Ambulatory Visit (HOSPITAL_BASED_OUTPATIENT_CLINIC_OR_DEPARTMENT_OTHER): Payer: Self-pay

## 2021-08-13 ENCOUNTER — Other Ambulatory Visit (HOSPITAL_BASED_OUTPATIENT_CLINIC_OR_DEPARTMENT_OTHER): Payer: Self-pay

## 2021-08-14 ENCOUNTER — Other Ambulatory Visit (HOSPITAL_BASED_OUTPATIENT_CLINIC_OR_DEPARTMENT_OTHER): Payer: Self-pay

## 2021-08-22 ENCOUNTER — Other Ambulatory Visit (HOSPITAL_BASED_OUTPATIENT_CLINIC_OR_DEPARTMENT_OTHER): Payer: Self-pay

## 2021-08-28 ENCOUNTER — Other Ambulatory Visit (HOSPITAL_BASED_OUTPATIENT_CLINIC_OR_DEPARTMENT_OTHER): Payer: Self-pay

## 2021-09-01 ENCOUNTER — Other Ambulatory Visit (HOSPITAL_BASED_OUTPATIENT_CLINIC_OR_DEPARTMENT_OTHER): Payer: Self-pay

## 2021-09-03 ENCOUNTER — Other Ambulatory Visit (HOSPITAL_BASED_OUTPATIENT_CLINIC_OR_DEPARTMENT_OTHER): Payer: Self-pay

## 2021-09-03 MED ORDER — PANTOPRAZOLE SODIUM 20 MG PO TBEC
20.0000 mg | DELAYED_RELEASE_TABLET | Freq: Every day | ORAL | 1 refills | Status: DC
  Filled 2021-09-03: qty 30, 30d supply, fill #0
  Filled 2021-10-01: qty 30, 30d supply, fill #1
  Filled 2021-11-14: qty 30, 30d supply, fill #2
  Filled 2021-12-20: qty 30, 30d supply, fill #3
  Filled 2022-01-16: qty 30, 30d supply, fill #4
  Filled 2022-02-16: qty 30, 30d supply, fill #5

## 2021-09-04 ENCOUNTER — Other Ambulatory Visit (HOSPITAL_BASED_OUTPATIENT_CLINIC_OR_DEPARTMENT_OTHER): Payer: Self-pay

## 2021-09-06 ENCOUNTER — Other Ambulatory Visit (HOSPITAL_BASED_OUTPATIENT_CLINIC_OR_DEPARTMENT_OTHER): Payer: Self-pay

## 2021-09-09 ENCOUNTER — Other Ambulatory Visit (HOSPITAL_BASED_OUTPATIENT_CLINIC_OR_DEPARTMENT_OTHER): Payer: Self-pay

## 2021-09-10 ENCOUNTER — Other Ambulatory Visit (HOSPITAL_BASED_OUTPATIENT_CLINIC_OR_DEPARTMENT_OTHER): Payer: Self-pay

## 2021-09-10 MED ORDER — HYDROCHLOROTHIAZIDE 25 MG PO TABS
25.0000 mg | ORAL_TABLET | Freq: Every day | ORAL | 1 refills | Status: DC
  Filled 2021-09-10: qty 30, 30d supply, fill #0
  Filled 2021-10-22: qty 30, 30d supply, fill #1
  Filled 2021-11-18: qty 30, 30d supply, fill #2
  Filled 2021-12-20: qty 30, 30d supply, fill #3
  Filled 2022-01-16: qty 30, 30d supply, fill #4
  Filled 2022-02-16: qty 30, 30d supply, fill #5

## 2021-09-16 ENCOUNTER — Other Ambulatory Visit (HOSPITAL_BASED_OUTPATIENT_CLINIC_OR_DEPARTMENT_OTHER): Payer: Self-pay

## 2021-09-16 ENCOUNTER — Other Ambulatory Visit (HOSPITAL_BASED_OUTPATIENT_CLINIC_OR_DEPARTMENT_OTHER): Payer: Self-pay | Admitting: Emergency Medicine

## 2021-09-17 ENCOUNTER — Other Ambulatory Visit (HOSPITAL_BASED_OUTPATIENT_CLINIC_OR_DEPARTMENT_OTHER): Payer: Self-pay

## 2021-09-17 MED ORDER — CITALOPRAM HYDROBROMIDE 40 MG PO TABS
40.0000 mg | ORAL_TABLET | Freq: Every day | ORAL | 1 refills | Status: DC
  Filled 2021-09-17: qty 30, 30d supply, fill #0
  Filled 2021-10-22: qty 30, 30d supply, fill #1
  Filled 2021-11-18: qty 30, 30d supply, fill #2
  Filled 2021-12-20: qty 30, 30d supply, fill #3
  Filled 2022-01-16: qty 30, 30d supply, fill #4
  Filled 2022-02-16: qty 30, 30d supply, fill #5

## 2021-09-20 ENCOUNTER — Other Ambulatory Visit (HOSPITAL_BASED_OUTPATIENT_CLINIC_OR_DEPARTMENT_OTHER): Payer: Self-pay

## 2021-09-20 MED ORDER — SIMVASTATIN 40 MG PO TABS
40.0000 mg | ORAL_TABLET | Freq: Every day | ORAL | 1 refills | Status: DC
  Filled 2021-09-20: qty 30, 30d supply, fill #0
  Filled 2021-10-26: qty 30, 30d supply, fill #1
  Filled 2021-12-02: qty 30, 30d supply, fill #2
  Filled 2021-12-31: qty 30, 30d supply, fill #3
  Filled 2022-02-24: qty 30, 30d supply, fill #4
  Filled 2022-03-26: qty 30, 30d supply, fill #5

## 2021-09-21 ENCOUNTER — Other Ambulatory Visit (HOSPITAL_BASED_OUTPATIENT_CLINIC_OR_DEPARTMENT_OTHER): Payer: Self-pay

## 2021-09-27 ENCOUNTER — Other Ambulatory Visit (HOSPITAL_BASED_OUTPATIENT_CLINIC_OR_DEPARTMENT_OTHER): Payer: Self-pay

## 2021-10-01 ENCOUNTER — Other Ambulatory Visit (HOSPITAL_BASED_OUTPATIENT_CLINIC_OR_DEPARTMENT_OTHER): Payer: Self-pay

## 2021-10-08 ENCOUNTER — Other Ambulatory Visit (HOSPITAL_BASED_OUTPATIENT_CLINIC_OR_DEPARTMENT_OTHER): Payer: Self-pay

## 2021-10-23 ENCOUNTER — Other Ambulatory Visit (HOSPITAL_BASED_OUTPATIENT_CLINIC_OR_DEPARTMENT_OTHER): Payer: Self-pay

## 2021-10-29 ENCOUNTER — Other Ambulatory Visit (HOSPITAL_BASED_OUTPATIENT_CLINIC_OR_DEPARTMENT_OTHER): Payer: Self-pay

## 2021-11-01 ENCOUNTER — Other Ambulatory Visit (HOSPITAL_BASED_OUTPATIENT_CLINIC_OR_DEPARTMENT_OTHER): Payer: Self-pay

## 2021-11-15 ENCOUNTER — Other Ambulatory Visit (HOSPITAL_BASED_OUTPATIENT_CLINIC_OR_DEPARTMENT_OTHER): Payer: Self-pay

## 2021-11-19 ENCOUNTER — Other Ambulatory Visit (HOSPITAL_BASED_OUTPATIENT_CLINIC_OR_DEPARTMENT_OTHER): Payer: Self-pay

## 2021-11-25 ENCOUNTER — Other Ambulatory Visit (HOSPITAL_BASED_OUTPATIENT_CLINIC_OR_DEPARTMENT_OTHER): Payer: Self-pay | Admitting: Emergency Medicine

## 2021-11-27 ENCOUNTER — Other Ambulatory Visit (HOSPITAL_BASED_OUTPATIENT_CLINIC_OR_DEPARTMENT_OTHER): Payer: Self-pay

## 2021-11-29 ENCOUNTER — Other Ambulatory Visit (HOSPITAL_BASED_OUTPATIENT_CLINIC_OR_DEPARTMENT_OTHER): Payer: Self-pay

## 2021-12-03 ENCOUNTER — Other Ambulatory Visit (HOSPITAL_BASED_OUTPATIENT_CLINIC_OR_DEPARTMENT_OTHER): Payer: Self-pay

## 2021-12-20 ENCOUNTER — Other Ambulatory Visit (HOSPITAL_BASED_OUTPATIENT_CLINIC_OR_DEPARTMENT_OTHER): Payer: Self-pay

## 2021-12-21 ENCOUNTER — Other Ambulatory Visit (HOSPITAL_BASED_OUTPATIENT_CLINIC_OR_DEPARTMENT_OTHER): Payer: Self-pay

## 2021-12-26 ENCOUNTER — Other Ambulatory Visit (HOSPITAL_BASED_OUTPATIENT_CLINIC_OR_DEPARTMENT_OTHER): Payer: Self-pay

## 2021-12-26 NOTE — Progress Notes (Deleted)
Office Visit Note  Patient: Katherine Wiggins             Date of Birth: [birth year]           MRN: [id number]             PCP: Jenel Lucks, PA-C Referring: Burna Cash* Visit Date: 01/09/2022 Occupation: _0 @  Subjective:  No chief complaint on file.   History of Present Illness: Katherine Wiggins is a 56 y.o. female ***   Activities of Daily Living:  Patient reports morning stiffness for *** {minute/hour:19697}.   Patient {ACTIONS;DENIES/REPORTS:21021675::"Denies"} nocturnal pain.  Difficulty dressing/grooming: {ACTIONS;DENIES/REPORTS:21021675::"Denies"} Difficulty climbing stairs: {ACTIONS;DENIES/REPORTS:21021675::"Denies"} Difficulty getting out of chair: {ACTIONS;DENIES/REPORTS:21021675::"Denies"} Difficulty using hands for taps, buttons, cutlery, and/or writing: {ACTIONS;DENIES/REPORTS:21021675::"Denies"}  No Rheumatology ROS completed.   PMFS History:  Patient Active Problem List   Diagnosis Date Noted   Hypertension, essential 08/05/2018   Elevated cholesterol 08/05/2018   Rheumatoid arthritis ([place]) 08/05/2018   Abnormal uterine bleeding ([facility]) 04/20/2018    Past Medical History:  Diagnosis Date   Asthma    Depression    Endometriosis    Fibromyalgia    High cholesterol    History of salpingectomy    Right   Hypertension    RA (rheumatoid arthritis) ([place])     Family History  Problem Relation Age of Onset   Diabetes Mother    Hypertension Mother    Diabetes Sister    Hypertension Sister    Hypertension Brother    Past Surgical History:  Procedure Laterality Date   CERVICAL SPINE SURGERY     RIGHT OOPHORECTOMY Right 1995   SALPINGECTOMY Right 1995   TUBAL LIGATION     Social History   Social History Narrative   Not on file    There is no immunization history on file for this patient.   Objective: Vital Signs: LMP 02/15/2021    Physical Exam   Musculoskeletal Exam: ***  CDAI Exam: CDAI Score: -- Patient  Global: --; Provider Global: -- Swollen: --; Tender: -- Joint Exam 01/09/2022   No joint exam has been documented for this visit   There is currently no information documented on the homunculus. Go to the Rheumatology activity and complete the homunculus joint exam.  Investigation: No additional findings.  Imaging: No results found.  Recent Labs: Lab Results  Component Value Date   WBC 7.3 08/22/2019   HGB 10.9 (L) 08/22/2019   PLT 363 08/22/2019   NA 137 08/22/2019   K 3.2 (L) 08/22/2019   CL 103 08/22/2019   CO2 24 08/22/2019   GLUCOSE 137 (H) 08/22/2019   BUN 13 08/22/2019   CREATININE 0.84 08/22/2019   BILITOT 0.3 08/22/2019   ALKPHOS 54 08/22/2019   AST 75 (H) 08/22/2019   ALT 213 (H) 08/22/2019   PROT 7.9 08/22/2019   ALBUMIN 3.5 08/22/2019   CALCIUM 8.9 08/22/2019   GFRAA >60 08/22/2019    Speciality Comments: No specialty comments available.  Procedures:  No procedures performed Allergies: Codeine, Nitrofurantoin, Sulfamethoxazole-trimethoprim, and Septra [sulfamethoxazole w/trimethoprim (co-trimoxazole)]   Assessment / Plan:     Visit Diagnoses: Rheumatoid arthritis involving multiple sites with positive rheumatoid factor ([place]) - 01/22/16: RF 632, CRP 3.6, anti-CCP 23, ESR 72. Previous pt of Dr. Earnest Conroy. Recurrent use of prednisone.   Avascular necrosis ([facility]) - Recurrent steriod use  Hypertension, essential  Elevated cholesterol  Orders: No orders of the defined types were placed in this encounter.  No orders of the defined types were  placed in this encounter.   Face-to-face time spent with patient was *** minutes. Greater than 50% of time was spent in counseling and coordination of care.  Follow-Up Instructions: No follow-ups on file.   Ofilia Neas, PA-C  Note - This record has been created using Dragon software.  Chart creation errors have been sought, but may not always  have been located. Such creation errors do not reflect on  the standard  of medical care.

## 2021-12-28 ENCOUNTER — Other Ambulatory Visit (HOSPITAL_BASED_OUTPATIENT_CLINIC_OR_DEPARTMENT_OTHER): Payer: Self-pay

## 2021-12-31 ENCOUNTER — Other Ambulatory Visit (HOSPITAL_BASED_OUTPATIENT_CLINIC_OR_DEPARTMENT_OTHER): Payer: Self-pay

## 2022-01-04 ENCOUNTER — Other Ambulatory Visit (HOSPITAL_BASED_OUTPATIENT_CLINIC_OR_DEPARTMENT_OTHER): Payer: Self-pay

## 2022-01-09 ENCOUNTER — Ambulatory Visit: Payer: No Typology Code available for payment source | Admitting: Rheumatology

## 2022-01-09 DIAGNOSIS — I10 Essential (primary) hypertension: Secondary | ICD-10-CM

## 2022-01-09 DIAGNOSIS — M8700 Idiopathic aseptic necrosis of unspecified bone: Secondary | ICD-10-CM

## 2022-01-09 DIAGNOSIS — E7800 Pure hypercholesterolemia, unspecified: Secondary | ICD-10-CM

## 2022-01-09 DIAGNOSIS — M0579 Rheumatoid arthritis with rheumatoid factor of multiple sites without organ or systems involvement: Secondary | ICD-10-CM

## 2022-01-16 ENCOUNTER — Encounter: Payer: Self-pay | Admitting: Obstetrics and Gynecology

## 2022-01-16 ENCOUNTER — Other Ambulatory Visit (HOSPITAL_COMMUNITY)
Admission: RE | Admit: 2022-01-16 | Discharge: 2022-01-16 | Disposition: A | Discharge status: Home / Self Care | Payer: No Typology Code available for payment source | Source: Ambulatory Visit | Attending: Obstetrics and Gynecology | Admitting: Obstetrics and Gynecology

## 2022-01-16 ENCOUNTER — Other Ambulatory Visit (HOSPITAL_BASED_OUTPATIENT_CLINIC_OR_DEPARTMENT_OTHER): Payer: Self-pay

## 2022-01-16 ENCOUNTER — Other Ambulatory Visit: Payer: Self-pay

## 2022-01-16 ENCOUNTER — Ambulatory Visit (INDEPENDENT_AMBULATORY_CARE_PROVIDER_SITE_OTHER): Payer: No Typology Code available for payment source | Admitting: Obstetrics and Gynecology

## 2022-01-16 VITALS — BP 114/66 | HR 65 | Wt 191.0 lb

## 2022-01-16 DIAGNOSIS — N941 Unspecified dyspareunia: Secondary | ICD-10-CM

## 2022-01-16 DIAGNOSIS — R102 Pelvic and perineal pain: Secondary | ICD-10-CM

## 2022-01-16 DIAGNOSIS — N958 Other specified menopausal and perimenopausal disorders: Secondary | ICD-10-CM | POA: Diagnosis present

## 2022-01-16 DIAGNOSIS — N939 Abnormal uterine and vaginal bleeding, unspecified: Secondary | ICD-10-CM

## 2022-01-16 DIAGNOSIS — Z1231 Encounter for screening mammogram for malignant neoplasm of breast: Secondary | ICD-10-CM

## 2022-01-16 MED ORDER — ESTRADIOL 0.1 MG/GM VA CREA: TOPICAL_CREAM | VAGINAL | 12 refills | Status: DC

## 2022-01-16 NOTE — Progress Notes (Signed)
Patient states she had heavy irregular bleeding for 7 days in August. Amanpreet Delmont l Baron Parmelee, CMA

## 2022-01-16 NOTE — Patient Instructions (Addendum)
Schedule a follow up to do your EMB Scheduled your ultrasound We will try vaginal moisturizer (estrogen) - you can also try over the counter vaginal moisturizers including coconut oil

## 2022-01-16 NOTE — Progress Notes (Signed)
GYNECOLOGY VISIT  Patient name: Katherine Wiggins MRN [id number]  Date of birth: May 31, 1965 Chief Complaint:   Irregular bleeding  History:  Katherine Wiggins is a 56 y.o. [id number] being seen today for abdominal pain and irregular menses.  Patient reports having had a period in January and February, skipped a month and then return of menses the following month.  Reports that menses following skipped months are often heavy, clotting, and painful. Menses in September and then had not had one yet this month and has been having pain, discomfort, itching, discharge . Discharge present for 3 weeks. Tried monistat without resolution.  Not having intercourse due to pain with intercourse that is then followed by nausea. Lower abdomen is tender and "hurts". Feels like her vagina is sore and swollen then Katherine Wiggins feels sick and nausea - this has been going on for 7 months.   Katherine Wiggins has also experienced vaginal dryness, hot flashes.   Lower abdominal pain never stops. Improve with ibuprofen, nothing else makes it worse.  Denies urinary frequency other than baseline from fluid pills Denies isues with constipation and diarrhea   Past Medical History:  Diagnosis Date   Asthma    Depression    Endometriosis    Fibromyalgia    High cholesterol    History of salpingectomy    Right   Hypertension    RA (rheumatoid arthritis) ([place])     Past Surgical History:  Procedure Laterality Date   CERVICAL SPINE SURGERY     RIGHT OOPHORECTOMY Right 1995   SALPINGECTOMY Right 1995   TUBAL LIGATION      The following portions of the patient's history were reviewed and updated as appropriate: allergies, current medications, past family history, past medical history, past social history, past surgical history and problem list.   Health Maintenance:   Last pap 03/2021. Results were: NILM w/ HRHPV negative. H/O abnormal pap: no Last mammogram: ordered, last 03/2018. Results were: normal.    Review of Systems:   Pertinent items are noted in HPI. Comprehensive review of systems was otherwise negative.   Objective:  Physical Exam BP 114/66   Pulse 65   Wt 191 lb (86.6 kg)   LMP 02/15/2021   BMI 33.83 kg/m    Physical Exam Vitals and nursing note reviewed. Exam conducted with a chaperone present.  Constitutional:      Appearance: Normal appearance.  HENT:     Head: Normocephalic and atraumatic.  Cardiovascular:     Rate and Rhythm: Normal rate and regular rhythm.  Pulmonary:     Effort: Pulmonary effort is normal.     Breath sounds: Normal breath sounds.  Genitourinary:    General: Normal vulva.     Exam position: Lithotomy position.     Labia:        Right: No rash, tenderness or lesion.        Left: No rash, tenderness or lesion.      Comments: + anal wink Normal vulvar sensation bilaterally  + allodynia throughout introitus  Tender pelvic floor muscles bilaterally  Skin:    General: Skin is warm and dry.  Neurological:     General: No focal deficit present.     Mental Status: Katherine Wiggins is alert.  Psychiatric:        Mood and Affect: Mood normal.        Behavior: Behavior normal.        Thought Content: Thought content normal.        Judgment:  Judgment normal.       Assessment & Plan:   1. Abnormal uterine bleeding (AUB) Recommend pelvic ultrasound and EMB at follow up visit. Suspect perimenopausal changes. Discussed management options including observation, medical management and surgical management. Patient is considering surgical management, endometrium to be evaluated first.  Further discussed management options following EMB and ultrasound -patient leaning towards endometrial ablation.   2. Dyspareunia, female Pelvic myalgia on exam as well as GSM. Recommend pelvic PT - Ambulatory referral to Physical Therapy  3. Genitourinary syndrome of menopause Allodynia and vaginal discharge likely due to menopausal state.  Vaginitis swab collected today.  Recommend vaginal  estrogen application.  Encourage lubrication and prolonged foreplay prior to vaginal intercourse.  - estradiol (ESTRACE) 0.1 MG/GM vaginal cream; Apply 1 gram per vagina every night for 2 weeks, then apply three times a week  Dispense: 30 g; Refill: 12 - Cervicovaginal ancillary only   Darliss Cheney, MD [facility] [place], [employer]

## 2022-01-17 ENCOUNTER — Other Ambulatory Visit (HOSPITAL_BASED_OUTPATIENT_CLINIC_OR_DEPARTMENT_OTHER): Payer: Self-pay

## 2022-01-17 ENCOUNTER — Other Ambulatory Visit: Payer: Self-pay | Admitting: Obstetrics and Gynecology

## 2022-01-17 ENCOUNTER — Encounter: Payer: Self-pay | Admitting: Obstetrics and Gynecology

## 2022-01-17 DIAGNOSIS — N958 Other specified menopausal and perimenopausal disorders: Secondary | ICD-10-CM

## 2022-01-17 MED ORDER — ESTRADIOL 0.1 MG/GM VA CREA: TOPICAL_CREAM | VAGINAL | 12 refills | Status: DC

## 2022-01-18 ENCOUNTER — Other Ambulatory Visit (HOSPITAL_BASED_OUTPATIENT_CLINIC_OR_DEPARTMENT_OTHER): Payer: Self-pay

## 2022-01-18 LAB — CERVICOVAGINAL ANCILLARY ONLY
Bacterial Vaginitis (gardnerella): NEGATIVE
Candida Glabrata: NEGATIVE
Candida Vaginitis: NEGATIVE
Chlamydia: NEGATIVE
Comment: NEGATIVE
Comment: NEGATIVE
Comment: NEGATIVE
Comment: NEGATIVE
Comment: NEGATIVE
Comment: NORMAL
Neisseria Gonorrhea: NEGATIVE
Trichomonas: NEGATIVE

## 2022-01-21 ENCOUNTER — Other Ambulatory Visit (HOSPITAL_BASED_OUTPATIENT_CLINIC_OR_DEPARTMENT_OTHER): Payer: Self-pay

## 2022-01-21 ENCOUNTER — Other Ambulatory Visit: Payer: Self-pay | Admitting: Obstetrics & Gynecology

## 2022-01-21 ENCOUNTER — Ambulatory Visit (HOSPITAL_BASED_OUTPATIENT_CLINIC_OR_DEPARTMENT_OTHER)
Admission: RE | Admit: 2022-01-21 | Discharge: 2022-01-21 | Disposition: A | Discharge status: Home / Self Care | Payer: No Typology Code available for payment source | Source: Ambulatory Visit | Attending: Obstetrics & Gynecology | Admitting: Obstetrics & Gynecology

## 2022-01-21 ENCOUNTER — Other Ambulatory Visit: Payer: Self-pay

## 2022-01-21 DIAGNOSIS — N958 Other specified menopausal and perimenopausal disorders: Secondary | ICD-10-CM

## 2022-01-21 DIAGNOSIS — R102 Pelvic and perineal pain: Secondary | ICD-10-CM | POA: Diagnosis present

## 2022-01-21 MED ORDER — ESTRADIOL 0.1 MG/GM VA CREA
TOPICAL_CREAM | VAGINAL | 12 refills | Status: AC
  Filled 2022-01-21: qty 42.5, 30d supply, fill #0
  Filled 2022-02-21: qty 42.5, 30d supply, fill #1
  Filled 2022-05-23: qty 42.5, 30d supply, fill #2
  Filled 2022-11-03 – 2022-12-24 (×3): qty 42.5, 30d supply, fill #3

## 2022-02-07 ENCOUNTER — Other Ambulatory Visit (HOSPITAL_BASED_OUTPATIENT_CLINIC_OR_DEPARTMENT_OTHER): Payer: Self-pay

## 2022-02-08 ENCOUNTER — Other Ambulatory Visit: Payer: No Typology Code available for payment source | Admitting: Obstetrics and Gynecology

## 2022-02-18 ENCOUNTER — Other Ambulatory Visit (HOSPITAL_BASED_OUTPATIENT_CLINIC_OR_DEPARTMENT_OTHER): Payer: Self-pay

## 2022-02-21 ENCOUNTER — Other Ambulatory Visit (HOSPITAL_BASED_OUTPATIENT_CLINIC_OR_DEPARTMENT_OTHER): Payer: Self-pay

## 2022-02-25 ENCOUNTER — Other Ambulatory Visit (HOSPITAL_BASED_OUTPATIENT_CLINIC_OR_DEPARTMENT_OTHER): Payer: Self-pay

## 2022-03-08 ENCOUNTER — Encounter: Payer: Self-pay | Admitting: General Practice

## 2022-03-08 ENCOUNTER — Other Ambulatory Visit: Payer: No Typology Code available for payment source | Admitting: Obstetrics and Gynecology

## 2022-03-08 ENCOUNTER — Other Ambulatory Visit: Payer: Self-pay

## 2022-03-21 ENCOUNTER — Other Ambulatory Visit (HOSPITAL_BASED_OUTPATIENT_CLINIC_OR_DEPARTMENT_OTHER): Payer: Self-pay

## 2022-03-22 ENCOUNTER — Other Ambulatory Visit: Payer: Self-pay

## 2022-03-22 ENCOUNTER — Other Ambulatory Visit (HOSPITAL_BASED_OUTPATIENT_CLINIC_OR_DEPARTMENT_OTHER): Payer: Self-pay

## 2022-03-22 MED ORDER — CITALOPRAM HYDROBROMIDE 40 MG PO TABS
40.0000 mg | ORAL_TABLET | Freq: Every day | ORAL | 0 refills | Status: DC
  Filled 2022-03-22: qty 30, 30d supply, fill #0
  Filled 2022-04-23: qty 30, 30d supply, fill #1
  Filled 2022-05-17: qty 30, 30d supply, fill #2

## 2022-03-22 MED ORDER — HYDROCHLOROTHIAZIDE 25 MG PO TABS
25.0000 mg | ORAL_TABLET | Freq: Every day | ORAL | 0 refills | Status: DC
  Filled 2022-03-22: qty 30, 30d supply, fill #0
  Filled 2022-04-23: qty 30, 30d supply, fill #1
  Filled 2022-05-17: qty 30, 30d supply, fill #2

## 2022-03-22 MED ORDER — PANTOPRAZOLE SODIUM 20 MG PO TBEC
20.0000 mg | DELAYED_RELEASE_TABLET | Freq: Every day | ORAL | 0 refills | Status: DC
  Filled 2022-03-22: qty 30, 30d supply, fill #0
  Filled 2022-04-23: qty 30, 30d supply, fill #1
  Filled 2022-05-17: qty 30, 30d supply, fill #2

## 2022-04-12 ENCOUNTER — Other Ambulatory Visit (HOSPITAL_BASED_OUTPATIENT_CLINIC_OR_DEPARTMENT_OTHER): Payer: Self-pay

## 2022-04-17 ENCOUNTER — Other Ambulatory Visit (HOSPITAL_BASED_OUTPATIENT_CLINIC_OR_DEPARTMENT_OTHER): Payer: Self-pay

## 2022-04-23 ENCOUNTER — Other Ambulatory Visit (HOSPITAL_BASED_OUTPATIENT_CLINIC_OR_DEPARTMENT_OTHER): Payer: Self-pay

## 2022-04-24 ENCOUNTER — Other Ambulatory Visit (HOSPITAL_BASED_OUTPATIENT_CLINIC_OR_DEPARTMENT_OTHER): Payer: Self-pay

## 2022-04-24 ENCOUNTER — Other Ambulatory Visit: Payer: Self-pay

## 2022-04-24 MED ORDER — SIMVASTATIN 40 MG PO TABS
40.0000 mg | ORAL_TABLET | Freq: Every day | ORAL | 1 refills | Status: DC
  Filled 2022-04-24: qty 30, 30d supply, fill #0
  Filled 2022-05-22: qty 30, 30d supply, fill #1
  Filled 2022-06-20: qty 30, 30d supply, fill #2
  Filled 2022-07-18 – 2022-07-24 (×2): qty 30, 30d supply, fill #3
  Filled 2022-08-19: qty 30, 30d supply, fill #4
  Filled 2022-09-16: qty 30, 30d supply, fill #5

## 2022-05-01 ENCOUNTER — Other Ambulatory Visit (HOSPITAL_BASED_OUTPATIENT_CLINIC_OR_DEPARTMENT_OTHER): Payer: Self-pay

## 2022-05-01 MED ORDER — METOPROLOL SUCCINATE ER 50 MG PO TB24
50.0000 mg | ORAL_TABLET | Freq: Every day | ORAL | 3 refills | Status: DC
  Filled 2022-05-01: qty 30, 30d supply, fill #0
  Filled 2022-05-19 – 2022-05-31 (×2): qty 30, 30d supply, fill #1
  Filled 2022-07-01: qty 30, 30d supply, fill #2
  Filled 2022-07-30: qty 30, 30d supply, fill #3
  Filled 2022-09-02: qty 30, 30d supply, fill #4
  Filled 2022-10-01: qty 30, 30d supply, fill #5
  Filled 2022-11-03: qty 30, 30d supply, fill #6
  Filled 2022-12-03: qty 30, 30d supply, fill #7
  Filled 2023-01-02: qty 30, 30d supply, fill #8
  Filled 2023-02-03: qty 30, 30d supply, fill #9
  Filled 2023-03-03: qty 30, 30d supply, fill #10
  Filled 2023-04-04: qty 30, 30d supply, fill #11

## 2022-05-08 ENCOUNTER — Other Ambulatory Visit (HOSPITAL_BASED_OUTPATIENT_CLINIC_OR_DEPARTMENT_OTHER): Payer: Self-pay

## 2022-05-14 ENCOUNTER — Other Ambulatory Visit: Payer: Self-pay

## 2022-05-17 ENCOUNTER — Other Ambulatory Visit (HOSPITAL_BASED_OUTPATIENT_CLINIC_OR_DEPARTMENT_OTHER): Payer: Self-pay

## 2022-05-20 ENCOUNTER — Other Ambulatory Visit: Payer: Self-pay

## 2022-05-20 ENCOUNTER — Other Ambulatory Visit (HOSPITAL_BASED_OUTPATIENT_CLINIC_OR_DEPARTMENT_OTHER): Payer: Self-pay

## 2022-05-22 ENCOUNTER — Other Ambulatory Visit: Payer: Self-pay

## 2022-06-02 ENCOUNTER — Encounter (HOSPITAL_BASED_OUTPATIENT_CLINIC_OR_DEPARTMENT_OTHER): Payer: Self-pay | Admitting: Emergency Medicine

## 2022-06-02 ENCOUNTER — Emergency Department (HOSPITAL_BASED_OUTPATIENT_CLINIC_OR_DEPARTMENT_OTHER)
Admission: EM | Admit: 2022-06-02 | Discharge: 2022-06-02 | Disposition: A | Discharge status: Home / Self Care | Payer: No Typology Code available for payment source | Attending: Emergency Medicine | Admitting: Emergency Medicine

## 2022-06-02 ENCOUNTER — Other Ambulatory Visit: Payer: Self-pay

## 2022-06-02 ENCOUNTER — Emergency Department (HOSPITAL_BASED_OUTPATIENT_CLINIC_OR_DEPARTMENT_OTHER): Payer: No Typology Code available for payment source

## 2022-06-02 DIAGNOSIS — Z8739 Personal history of other diseases of the musculoskeletal system and connective tissue: Secondary | ICD-10-CM

## 2022-06-02 DIAGNOSIS — I10 Essential (primary) hypertension: Secondary | ICD-10-CM | POA: Insufficient documentation

## 2022-06-02 DIAGNOSIS — M25562 Pain in left knee: Secondary | ICD-10-CM | POA: Diagnosis present

## 2022-06-02 DIAGNOSIS — Z79899 Other long term (current) drug therapy: Secondary | ICD-10-CM | POA: Insufficient documentation

## 2022-06-02 MED ORDER — PREDNISONE 50 MG PO TABS: 50.0000 mg | ORAL_TABLET | Freq: Every day | ORAL | 0 refills | Status: AC

## 2022-06-02 NOTE — ED Triage Notes (Signed)
Pt arrives pov, steady gait with c/o LT knee pain x 4 days. Denies injury. Reports anterior and posterior pain

## 2022-06-02 NOTE — ED Provider Notes (Signed)
[place] EMERGENCY DEPARTMENT AT [facility] Provider Note   CSN: [id number] Arrival date & time: 06/02/22  0909     History  Chief Complaint  Patient presents with   Knee Pain    Katherine Wiggins is a 57 y.o. female who past medical history significant for hypertension and rheumatoid arthritis presents to the ED complaining of left knee pain for the past 4 days.  She denies any injury, falls, or abnormal movements to attribute to her pain.  She states that it hurts in the front and the back of her knee and she feels that her knee is somewhat swollen.  Patient has been trying ibuprofen and Tylenol for his symptoms but states that she has had no relief.  Patient is not currently on any maintenance medication for rheumatoid arthritis, but does have an appointment scheduled in May.  Patient states she has not seen rheumatology in a couple of years.  Patient has been seen in ED prior for rheumatoid joint flareups.  Denies fever, chills, numbness, weakness.  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       Home Medications Prior to Admission medications   Medication Sig Start Date End Date Taking? Authorizing Provider  albuterol (VENTOLIN HFA) 108 (90 Base) MCG/ACT inhaler INHALE 1 - 2 PUFFS INTO THE LUNGS EVERY 6 HOURS AS NEEDED FOR WHEEZING OR SHORTNESS OF BREATH 04/03/20 04/03/21  Long, Wonda Olds, MD  albuterol (VENTOLIN HFA) 108 (90 Base) MCG/ACT inhaler INHALE 1 - 2 PUFFS INTO THE LUNGS EVERY 6 HOURS AS NEEDED FOR WHEEZING OR SHORTNESS OF BREATH 06/21/21     amitriptyline (ELAVIL) 25 MG tablet Take 1 tablet (25 mg total) by mouth nightly. 06/21/21     amoxicillin (AMOXIL) 500 MG capsule Take 1 capsule (500 mg total) by mouth three times a day with food until gone. 04/16/21     aspirin 81 MG tablet Take 81 mg by mouth daily.    [provider]  cetirizine (ZYRTEC) 10 MG tablet TAKE 1 TABLET (10 MG TOTAL) BY MOUTH DAILY. 01/03/20 01/02/21   Tacy Learn, PA-C  citalopram (CELEXA) 40 MG tablet TAKE 1 TABLET (40 MG TOTAL) BY MOUTH DAILY. 05/02/20 05/02/21  Jenel Lucks, PA-C  citalopram (CELEXA) 40 MG tablet Take 1 tablet (40 mg total) by mouth daily.**MUST KEEP UPCOMING APPT** 03/22/22     estradiol (ESTRACE) 0.1 MG/GM vaginal cream Apply 1 gram per vagina every night for 2 weeks, then apply three times a week 01/21/22   Darliss Cheney, MD  fluticasone (FLONASE) 50 MCG/ACT nasal spray PLACE 1 SPRAY INTO BOTH NOSTRILS DAILY. 01/03/20 01/02/21  Tacy Learn, PA-C  gatifloxacin (ZYMAXID) 0.5 % SOLN Place 1 drop into the left eye four times daily as directed Patient not taking: Reported on 01/16/2022 06/05/21   Darleen Crocker, MD  hydrochlorothiazide (HYDRODIURIL) 25 MG tablet Take by mouth. 05/27/18   [provider]  hydrochlorothiazide (HYDRODIURIL) 25 MG tablet TAKE 1 TABLET (25 MG TOTAL) BY MOUTH DAILY. 05/02/20 05/02/21  Jenel Lucks, PA-C  hydrochlorothiazide (HYDRODIURIL) 25 MG tablet Take 1 tablet (25 mg total) by mouth daily.**MUST KEEP UPCOMING APPT** 03/22/22     ketorolac (ACULAR) 0.5 % ophthalmic solution Place 1 drop into the left eye four times daily as directed Patient not taking: Reported on 01/16/2022 06/05/21   Darleen Crocker, MD  LORazepam (ATIVAN) 2 MG tablet Take 1 tablet by mouth 1 hour pre-op with a  small sip of water 10/19/20     metoprolol succinate (TOPROL-XL) 100 MG 24 hr tablet TAKE 1 TABLET (100 MG TOTAL) BY MOUTH EVERY MORNING. 05/02/20 05/02/21  Jenel Lucks, PA-C  metoprolol succinate (TOPROL-XL) 100 MG 24 hr tablet Take 1 tablet (100 mg total) by mouth every morning. **NO MORE REFILLS-NEEDS AN OFFICE VISIT** Patient not taking: Reported on 01/16/2022 05/21/21     metoprolol succinate (TOPROL-XL) 100 MG 24 hr tablet Take 1 tablet (100 mg total) by mouth every morning. 06/21/21     metoprolol succinate (TOPROL-XL) 25 MG 24 hr tablet TAKE 1 TABLET BY MOUTH DAILY WITH  '100MG'$  TABLET **NEEDS AN OFFICE VISIT** 02/09/20 01/16/22  Jenel Lucks, PA-C  metoprolol succinate (TOPROL-XL) 25 MG 24 hr tablet TAKE 1 TABLET (25 MG TOTAL) BY MOUTH DAILY WITH 100 MG TABLET. 06/21/21     metoprolol succinate (TOPROL-XL) 50 MG 24 hr tablet Take 1 tablet (50 mg total) by mouth at bedtime. Continue metoprolol ER 100 mg every morning. 05/01/22     metoprolol tartrate (LOPRESSOR) 100 MG tablet Take 100 mg by mouth 2 (two) times daily.    [provider]  ondansetron (ZOFRAN-ODT) 4 MG disintegrating tablet Place 1 tablet on the top of tongue where they will dissolve, then swallow every 8 hours as needed for nausea/vomiting 10/19/20     pantoprazole (PROTONIX) 20 MG tablet TAKE 1 TABLET (20 MG TOTAL) BY MOUTH DAILY. 05/02/20 05/02/21  Jenel Lucks, PA-C  pantoprazole (PROTONIX) 20 MG tablet TAKE 1 TABLET (20 MG TOTAL) BY MOUTH DAILY. **NEEDS AN OFFICE VISIT** 02/04/20 02/03/21  Jenel Lucks, PA-C  pantoprazole (PROTONIX) 20 MG tablet Take 1 tablet (20 mg total) by mouth daily.**MUST KEEP UPCOMING APPT** 03/22/22     potassium chloride SA (KLOR-CON M) 20 MEQ tablet TAKE 1 TABLET (20 MEQ TOTAL) BY MOUTH DAILY. 05/02/20 05/27/21  Jenel Lucks, PA-C  potassium chloride SA (KLOR-CON M) 20 MEQ tablet Take 1 tablet (20 mEq total) by mouth daily. 06/21/21     prednisoLONE acetate (PRED FORTE) 1 % ophthalmic suspension Place 1 drop into the left eye four times daily as directed. **Shake well before each use** Patient not taking: Reported on 01/16/2022 06/05/21   Darleen Crocker, MD  predniSONE (DELTASONE) 50 MG tablet Take 1 tablet (50 mg total) by mouth daily with breakfast. 06/02/22   Kelii Chittum R, PA  simvastatin (ZOCOR) 40 MG tablet TAKE 1 TABLET (40 MG TOTAL) BY MOUTH DAILY. 05/02/20 05/02/21  Jenel Lucks, PA-C  simvastatin (ZOCOR) 40 MG tablet TAKE 1 TABLET BY MOUTH ONCE DAILY 05/24/19 05/23/20  Jenel Lucks, PA-C   simvastatin (ZOCOR) 40 MG tablet Take 1 tablet (40 mg total) by mouth daily. 04/24/22     valACYclovir (VALTREX) 500 MG tablet Take 1 tablet (500 mg total) by mouth daily. 06/21/21         Allergies    Codeine, Nitrofurantoin, Sulfamethoxazole-trimethoprim, and Septra [sulfamethoxazole w/trimethoprim (co-trimoxazole)]    Review of Systems   Review of Systems  Constitutional:  Negative for chills and fever.  Musculoskeletal:  Positive for arthralgias (Left knee pain) and joint swelling (Left knee). Negative for myalgias.  Skin:  Negative for wound.  Neurological:  Negative for weakness and numbness.    Physical Exam Updated Vital Signs BP 131/74 (BP Location: Right Arm)   Pulse 62   Temp 98.8 F (37.1 C) (Oral)   Resp 18   Ht '5\' 3"'$  (1.6 m)   Wt 81.6  kg   LMP 02/15/2021   SpO2 97%   BMI 31.89 kg/m  Physical Exam Vitals and nursing note reviewed.  Constitutional:      General: She is not in acute distress.    Appearance: Normal appearance. She is not ill-appearing or diaphoretic.  Pulmonary:     Effort: Pulmonary effort is normal.  Musculoskeletal:     Left knee: Swelling present. No deformity, effusion or erythema. Decreased range of motion (Due to pain). Tenderness present. Normal pulse.     Comments: Patient has mild swelling and tenderness to the left knee.  Tenderness to palpation of the anterior and posterior aspects.  No tenderness over the MCL or LCL.  Mild decrease in range of motion due to pain.  No obvious joint effusion.  Neurological:     Mental Status: She is alert. Mental status is at baseline.  Psychiatric:        Mood and Affect: Mood normal.        Behavior: Behavior normal.     ED Results / Procedures / Treatments   Labs (all labs ordered are listed, but only abnormal results are displayed) Labs Reviewed - No data to display  EKG None  Radiology DG Knee Complete 4 Views Left  Result Date: 06/02/2022 CLINICAL DATA:  Knee pain. EXAM: LEFT KNEE -  COMPLETE 4+ VIEW COMPARISON:  None Available. FINDINGS: Four views. No fracture, dislocation or joint effusion. Marginal osteophyte formation along the patellofemoral and medial tibiofemoral compartments. Soft tissues are unremarkable. IMPRESSION: No fracture, dislocation or knee joint effusion. Mild osteoarthritis. Electronically Signed   By: Emmit Alexanders M.D.   On: 06/02/2022 10:16    Procedures Procedures    Medications Ordered in ED Medications - No data to display  ED Course/ Medical Decision Making/ A&P                             Medical Decision Making Amount and/or Complexity of Data Reviewed Radiology: ordered.   This patient presents to the ED with chief complaint(s) of atraumatic left knee pain with pertinent past medical history of RA, HTN.  The complaint involves an extensive differential diagnosis and also carries with it a high risk of complications and morbidity.    The differential diagnosis includes RA flare up, osteoarthritis, joint effusion, acute fracture or dislocation, septic arthritis    The initial plan is to obtain x-ray of left knee  Initial Assessment:   Exam significant for mild swelling and tenderness to the left knee, specifically anterior and posterior aspects.  No tenderness over the MCL or LCL.  She has a mild decrease in range of motion due to discomfort.  No obvious joint effusion.  No obvious dislocation or deformity.  Independent visualization and interpretation of imaging: I independently visualized the following imaging with scope of interpretation limited to determining acute life threatening conditions related to emergency care: Left knee x-ray, which revealed osteoarthritis without joint effusion, dislocation, or acute fracture.  I agree with radiologist interpretation.  Treatment and Reassessment: Discussed with patient findings of x-ray.  I suspect that her symptoms are related to her rheumatoid arthritis given that this is an atraumatic  pain.  Patient is not currently on any maintenance medication for RA and has an appointment scheduled with rheumatology in May.  Will treat patient with short course of prednisone to help with flare up.  Also discussed supportive care measures for home including elevation of the knee  and use of warm or cool compresses, whichever feels better for the patient.  Advised patient to avoid taking ibuprofen while on prednisone, but she may use Tylenol.  Note work was provided.  Disposition:   The patient has been appropriately medically screened and/or stabilized in the ED. I have low suspicion for any other emergent medical condition which would require further screening, evaluation or treatment in the ED or require inpatient management. At time of discharge the patient is hemodynamically stable and in no acute distress. I have discussed work-up results and diagnosis with patient and answered all questions. Patient is agreeable with discharge plan. We discussed strict return precautions for returning to the emergency department and they verbalized understanding.            Final Clinical Impression(s) / ED Diagnoses Final diagnoses:  Acute pain of left knee  Personal history of rheumatoid arthritis    Rx / DC Orders ED Discharge Orders          Ordered    predniSONE (DELTASONE) 50 MG tablet  Daily with breakfast        06/02/22 1120              Theressa Stamps [place], [place] 06/02/22 1121    Wyvonnia Dusky, MD 06/02/22 1450

## 2022-06-02 NOTE — Discharge Instructions (Signed)
Thank you for allowing me to be part of your care today.  I have sent over a prescription for prednisone to your pharmacy to help with your knee pain.  Please keep your follow-up appointment with rheumatology in May as I believe your knee pain may be related to your rheumatoid arthritis.  I recommend following up with your primary care provider sooner as needed.  Do not take ibuprofen or other NSAIDs while you are on prednisone.  You may use Tylenol as needed.  I also recommend rest, elevation, and warm or cool compresses to the knee (whichever feels better for you).

## 2022-06-17 ENCOUNTER — Other Ambulatory Visit (HOSPITAL_BASED_OUTPATIENT_CLINIC_OR_DEPARTMENT_OTHER): Payer: Self-pay

## 2022-06-17 MED ORDER — HYDROCHLOROTHIAZIDE 25 MG PO TABS
25.0000 mg | ORAL_TABLET | Freq: Every day | ORAL | 1 refills | Status: DC
  Filled 2022-06-17: qty 30, 30d supply, fill #0
  Filled 2022-07-16 – 2022-07-24 (×2): qty 30, 30d supply, fill #1
  Filled 2022-08-16: qty 30, 30d supply, fill #2
  Filled 2022-09-16: qty 30, 30d supply, fill #3
  Filled 2022-10-15: qty 30, 30d supply, fill #4
  Filled 2022-11-15: qty 30, 30d supply, fill #5

## 2022-06-17 MED ORDER — CITALOPRAM HYDROBROMIDE 40 MG PO TABS
40.0000 mg | ORAL_TABLET | Freq: Every day | ORAL | 1 refills | Status: DC
  Filled 2022-06-17: qty 30, 30d supply, fill #0
  Filled 2022-07-16 – 2022-07-24 (×2): qty 30, 30d supply, fill #1
  Filled 2022-08-16: qty 30, 30d supply, fill #2
  Filled 2022-09-16: qty 30, 30d supply, fill #3
  Filled 2022-10-15: qty 30, 30d supply, fill #4
  Filled 2022-11-15: qty 30, 30d supply, fill #5

## 2022-06-17 MED ORDER — PANTOPRAZOLE SODIUM 20 MG PO TBEC
20.0000 mg | DELAYED_RELEASE_TABLET | Freq: Every morning | ORAL | 1 refills | Status: DC
  Filled 2022-06-17: qty 30, 30d supply, fill #0
  Filled 2022-07-12 – 2022-07-24 (×2): qty 30, 30d supply, fill #1
  Filled 2022-08-16: qty 30, 30d supply, fill #2
  Filled 2022-09-16: qty 30, 30d supply, fill #3
  Filled 2022-10-15: qty 30, 30d supply, fill #4
  Filled 2022-11-15: qty 30, 30d supply, fill #5

## 2022-06-19 ENCOUNTER — Other Ambulatory Visit (HOSPITAL_BASED_OUTPATIENT_CLINIC_OR_DEPARTMENT_OTHER): Payer: Self-pay

## 2022-06-24 ENCOUNTER — Other Ambulatory Visit (HOSPITAL_BASED_OUTPATIENT_CLINIC_OR_DEPARTMENT_OTHER): Payer: Self-pay

## 2022-07-04 ENCOUNTER — Encounter (HOSPITAL_BASED_OUTPATIENT_CLINIC_OR_DEPARTMENT_OTHER): Payer: Self-pay | Admitting: Pediatrics

## 2022-07-04 ENCOUNTER — Emergency Department (HOSPITAL_BASED_OUTPATIENT_CLINIC_OR_DEPARTMENT_OTHER)
Admission: EM | Admit: 2022-07-04 | Discharge: 2022-07-04 | Disposition: A | Discharge status: Home / Self Care | Payer: No Typology Code available for payment source | Attending: Emergency Medicine | Admitting: Emergency Medicine

## 2022-07-04 ENCOUNTER — Other Ambulatory Visit (HOSPITAL_BASED_OUTPATIENT_CLINIC_OR_DEPARTMENT_OTHER): Payer: Self-pay

## 2022-07-04 ENCOUNTER — Other Ambulatory Visit: Payer: Self-pay

## 2022-07-04 DIAGNOSIS — Z79899 Other long term (current) drug therapy: Secondary | ICD-10-CM | POA: Insufficient documentation

## 2022-07-04 DIAGNOSIS — Z7982 Long term (current) use of aspirin: Secondary | ICD-10-CM | POA: Insufficient documentation

## 2022-07-04 DIAGNOSIS — J45909 Unspecified asthma, uncomplicated: Secondary | ICD-10-CM | POA: Diagnosis not present

## 2022-07-04 DIAGNOSIS — H9203 Otalgia, bilateral: Secondary | ICD-10-CM | POA: Insufficient documentation

## 2022-07-04 DIAGNOSIS — Z7951 Long term (current) use of inhaled steroids: Secondary | ICD-10-CM | POA: Diagnosis not present

## 2022-07-04 DIAGNOSIS — I10 Essential (primary) hypertension: Secondary | ICD-10-CM | POA: Diagnosis not present

## 2022-07-04 MED ORDER — OFLOXACIN 0.3 % OT SOLN
Freq: Two times a day (BID) | OTIC | 0 refills | Status: AC
  Filled 2022-07-04: qty 5, 10d supply, fill #0

## 2022-07-04 MED ORDER — CEFDINIR 300 MG PO CAPS
300.0000 mg | ORAL_CAPSULE | Freq: Two times a day (BID) | ORAL | 0 refills | Status: AC
  Filled 2022-07-04: qty 16, 8d supply, fill #0

## 2022-07-04 NOTE — ED Notes (Signed)
Discharge instructions reviewed with patient. Patient verbalizes understanding, no further questions at this time. Medications/prescriptions and follow up information provided. No acute distress noted at time of departure.  

## 2022-07-04 NOTE — Discharge Instructions (Signed)
The workup today was overall consistent with ear infection.  Will treat this with oral pills as well as drops.  Take medication as prescribed.  Recommend follow-up with primary care for reassessment of your symptoms.  Please do not hesitate to return to emergency department for worrisome signs and symptoms we discussed become apparent.

## 2022-07-04 NOTE — ED Triage Notes (Signed)
C/O bilateral ear pain, tender to touch x 1 week, denies any drainage.

## 2022-07-04 NOTE — ED Provider Notes (Signed)
[facility] Provider Note   CSN: [id number] Arrival date & time: 07/04/22  1552     History  Chief Complaint  Patient presents with   Otalgia    Katherine Wiggins is a 57 y.o. female.   Otalgia   57 year old female presents to the ER with complaints of bilateral ear pain.  Patient states that her pain is present since this past Friday.  Has been cleaning out bilateral ear with Q-tip.  Denies any changes in hearing, fever, cough, congestion, nasal drainage, fever.  Has tried no oral medications for this.  Past medical history significant for asthma, endometriosis, fibromyalgia, hypercholesterolemia, hypertension, rheumatoid arthritis  Home Medications Prior to Admission medications   Medication Sig Start Date End Date Taking? Authorizing Provider  cefdinir (OMNICEF) 300 MG capsule Take 1 capsule (300 mg total) by mouth 2 (two) times daily. 07/04/22  Yes Sherian Maroonobbins, Darrion Wyszynski A, PA  ofloxacin (FLOXIN) 0.3 % OTIC solution Place 2 drops into both ears 2 (two) times daily for 10 days. 07/04/22 07/14/22 Yes Sherian Maroonobbins, Fleda Pagel A, PA  albuterol (VENTOLIN HFA) 108 (90 Base) MCG/ACT inhaler INHALE 1 - 2 PUFFS INTO THE LUNGS EVERY 6 HOURS AS NEEDED FOR WHEEZING OR SHORTNESS OF BREATH 04/03/20 04/03/21  Long, Arlyss RepressJoshua G, MD  albuterol (VENTOLIN HFA) 108 (90 Base) MCG/ACT inhaler INHALE 1 - 2 PUFFS INTO THE LUNGS EVERY 6 HOURS AS NEEDED FOR WHEEZING OR SHORTNESS OF BREATH 06/21/21     amitriptyline (ELAVIL) 25 MG tablet Take 1 tablet (25 mg total) by mouth nightly. 06/21/21     amoxicillin (AMOXIL) 500 MG capsule Take 1 capsule (500 mg total) by mouth three times a day with food until gone. 04/16/21     aspirin 81 MG tablet Take 81 mg by mouth daily.    [provider]  cetirizine (ZYRTEC) 10 MG tablet TAKE 1 TABLET (10 MG TOTAL) BY MOUTH DAILY. 01/03/20 01/02/21  Jeannie FendMurphy, Laura A, PA-C  citalopram (CELEXA) 40 MG tablet TAKE 1 TABLET (40 MG TOTAL) BY MOUTH  DAILY. 05/02/20 05/02/21  Kerin Salen'Connor, Lauren Elizabeth, PA-C  citalopram (CELEXA) 40 MG tablet Take 1 tablet (40 mg total) by mouth daily. 06/17/22     estradiol (ESTRACE) 0.1 MG/GM vaginal cream Apply 1 gram per vagina every night for 2 weeks, then apply three times a week 01/21/22   Lorriane ShireAjewole, Christana, MD  fluticasone (FLONASE) 50 MCG/ACT nasal spray PLACE 1 SPRAY INTO BOTH NOSTRILS DAILY. 01/03/20 01/02/21  Jeannie FendMurphy, Laura A, PA-C  gatifloxacin (ZYMAXID) 0.5 % SOLN Place 1 drop into the left eye four times daily as directed Patient not taking: Reported on 01/16/2022 06/05/21   Mia CreekBevis, Timothy, MD  hydrochlorothiazide (HYDRODIURIL) 25 MG tablet Take by mouth. 05/27/18   [provider]  hydrochlorothiazide (HYDRODIURIL) 25 MG tablet TAKE 1 TABLET (25 MG TOTAL) BY MOUTH DAILY. 05/02/20 05/02/21  Kerin Salen'Connor, Lauren Elizabeth, PA-C  hydrochlorothiazide (HYDRODIURIL) 25 MG tablet Take 1 tablet (25 mg total) by mouth daily. 06/17/22     ketorolac (ACULAR) 0.5 % ophthalmic solution Place 1 drop into the left eye four times daily as directed Patient not taking: Reported on 01/16/2022 06/05/21   Mia CreekBevis, Timothy, MD  LORazepam (ATIVAN) 2 MG tablet Take 1 tablet by mouth 1 hour pre-op with a small sip of water 10/19/20     metoprolol succinate (TOPROL-XL) 100 MG 24 hr tablet TAKE 1 TABLET (100 MG TOTAL) BY MOUTH EVERY MORNING. 05/02/20 05/02/21  Kerin Salen'Connor, Lauren Elizabeth, PA-C  metoprolol succinate (  TOPROL-XL) 100 MG 24 hr tablet Take 1 tablet (100 mg total) by mouth every morning. **NO MORE REFILLS-NEEDS AN OFFICE VISIT** Patient not taking: Reported on 01/16/2022 05/21/21     metoprolol succinate (TOPROL-XL) 100 MG 24 hr tablet Take 1 tablet (100 mg total) by mouth every morning. 06/21/21     metoprolol succinate (TOPROL-XL) 25 MG 24 hr tablet TAKE 1 TABLET BY MOUTH DAILY WITH 100MG  TABLET **NEEDS AN OFFICE VISIT** 02/09/20 01/16/22  Kerin Salen, PA-C  metoprolol succinate (TOPROL-XL) 25 MG 24 hr tablet TAKE  1 TABLET (25 MG TOTAL) BY MOUTH DAILY WITH 100 MG TABLET. 06/21/21     metoprolol succinate (TOPROL-XL) 50 MG 24 hr tablet Take 1 tablet (50 mg total) by mouth at bedtime. Continue metoprolol ER 100 mg every morning. 05/01/22     metoprolol tartrate (LOPRESSOR) 100 MG tablet Take 100 mg by mouth 2 (two) times daily.    [provider]  ondansetron (ZOFRAN-ODT) 4 MG disintegrating tablet Place 1 tablet on the top of tongue where they will dissolve, then swallow every 8 hours as needed for nausea/vomiting 10/19/20     pantoprazole (PROTONIX) 20 MG tablet TAKE 1 TABLET (20 MG TOTAL) BY MOUTH DAILY. 05/02/20 05/02/21  Kerin Salen, PA-C  pantoprazole (PROTONIX) 20 MG tablet TAKE 1 TABLET (20 MG TOTAL) BY MOUTH DAILY. **NEEDS AN OFFICE VISIT** 02/04/20 02/03/21  Kerin Salen, PA-C  pantoprazole (PROTONIX) 20 MG tablet Take 1 tablet (20 mg total) by mouth every morning before breakfast. 06/17/22     potassium chloride SA (KLOR-CON M) 20 MEQ tablet TAKE 1 TABLET (20 MEQ TOTAL) BY MOUTH DAILY. 05/02/20 05/27/21  Kerin Salen, PA-C  potassium chloride SA (KLOR-CON M) 20 MEQ tablet Take 1 tablet (20 mEq total) by mouth daily. 06/21/21     prednisoLONE acetate (PRED FORTE) 1 % ophthalmic suspension Place 1 drop into the left eye four times daily as directed. **Shake well before each use** Patient not taking: Reported on 01/16/2022 06/05/21   Mia Creek, MD  predniSONE (DELTASONE) 50 MG tablet Take 1 tablet (50 mg total) by mouth daily with breakfast. 06/02/22   Chestine Spore, Meghan R, PA-C  simvastatin (ZOCOR) 40 MG tablet TAKE 1 TABLET (40 MG TOTAL) BY MOUTH DAILY. 05/02/20 05/02/21  Kerin Salen, PA-C  simvastatin (ZOCOR) 40 MG tablet TAKE 1 TABLET BY MOUTH ONCE DAILY 05/24/19 05/23/20  Kerin Salen, PA-C  simvastatin (ZOCOR) 40 MG tablet Take 1 tablet (40 mg total) by mouth daily. 04/24/22     valACYclovir (VALTREX) 500 MG tablet Take 1 tablet (500 mg total)  by mouth daily. 06/21/21         Allergies    Codeine, Nitrofurantoin, Sulfamethoxazole-trimethoprim, and Septra [sulfamethoxazole w/trimethoprim (co-trimoxazole)]    Review of Systems   Review of Systems  HENT:  Positive for ear pain.   All other systems reviewed and are negative.   Physical Exam Updated Vital Signs BP 129/64 (BP Location: Left Arm)   Pulse 63   Temp 98.2 F (36.8 C) (Oral)   Resp 20   Ht 5\' 3"  (1.6 m)   Wt 81.6 kg   LMP 02/15/2021   SpO2 98%   BMI 31.89 kg/m  Physical Exam Vitals and nursing note reviewed.  Constitutional:      General: She is not in acute distress.    Appearance: She is well-developed.  HENT:     Head: Normocephalic and atraumatic.     Ears:  Comments: Patient with erythematous TMs bilaterally with purulent fluid posteriorly.  Extremely tender external auditory canals bilaterally without appreciable drainage or marked erythema.  No pain with tragus manipulation or palpation of pinna bilaterally.    Nose: Nose normal.     Mouth/Throat:     Mouth: Mucous membranes are moist.     Pharynx: Oropharynx is clear.  Eyes:     Conjunctiva/sclera: Conjunctivae normal.  Cardiovascular:     Rate and Rhythm: Normal rate and regular rhythm.     Heart sounds: No murmur heard. Pulmonary:     Effort: Pulmonary effort is normal. No respiratory distress.     Breath sounds: Normal breath sounds.  Abdominal:     Palpations: Abdomen is soft.     Tenderness: There is no abdominal tenderness.  Musculoskeletal:        General: No swelling.     Cervical back: Neck supple.  Skin:    General: Skin is warm and dry.     Capillary Refill: Capillary refill takes less than 2 seconds.  Neurological:     Mental Status: She is alert.  Psychiatric:        Mood and Affect: Mood normal.     ED Results / Procedures / Treatments   Labs (all labs ordered are listed, but only abnormal results are displayed) Labs Reviewed - No data to  display  EKG None  Radiology No results found.  Procedures Procedures    Medications Ordered in ED Medications - No data to display  ED Course/ Medical Decision Making/ A&P                             Medical Decision Making Risk Prescription drug management.   This patient presents to the ED for concern of ear pain, this involves an extensive number of treatment options, and is a complaint that carries with it a high risk of complications and morbidity.  The differential diagnosis includes otitis media, otitis externa, perforated TM, cellulitis, erysipelas, abscess formation   Co morbidities that complicate the patient evaluation  See HPI   Additional history obtained:  Additional history obtained from EMR External records from outside source obtained and reviewed including hospital records   Lab Tests:  N/a   Imaging Studies ordered:  N/a   Cardiac Monitoring: / EKG:  The patient was maintained on a cardiac monitor.  I personally viewed and interpreted the cardiac monitored which showed an underlying rhythm of: Sinus rhythm   Consultations Obtained:  N/a   Problem List / ED Course / Critical interventions / Medication management  Ear pain Reevaluation of the patient showed that the patient stayed the same I have reviewed the patients home medicines and have made adjustments as needed   Social Determinants of Health:  Denies tobacco, illicit drug use   Test / Admission - Considered:  Bilateral ear pain Vitals signs within normal range and stable throughout visit. 57 year old female presents emergency department with complaints of bilateral ear pain.  Upon exam, patient with evidence of bilateral otitis media.  No obvious visible signs of otitis externa but with patient extreme tenderness with placement of otoscope.  No external signs of cellulitic skin changes of external ear bilaterally.  Patient overall well-appearing, afebrile in no acute  distress.  Will treat with oral antibiotic as well as topical given tenderness of the external auditory canal.  Patient educated also regarding cessation of Q-tip in ear.  Patient  recommended close follow-up with primary care for reassessment of symptoms.  Treatment plan discussed with patient and she acknowledged understanding was agreeable to said plan. Worrisome signs and symptoms were discussed with the patient, and the patient acknowledged understanding to return to the ED if noticed. Patient was stable upon discharge.          Final Clinical Impression(s) / ED Diagnoses Final diagnoses:  Acute ear pain, bilateral    Rx / DC Orders ED Discharge Orders          Ordered    ofloxacin (FLOXIN) 0.3 % OTIC solution  2 times daily       Note to Pharmacy: C. Amarachi Kotz allowed change to ofloxacin otic (ciproflox otic oos)   07/04/22 1737    cefdinir (OMNICEF) 300 MG capsule  2 times daily        07/04/22 1737              Peter Garter, [place] 07/04/22 1759    Terrilee Files, MD 07/05/22 1053

## 2022-07-08 NOTE — Progress Notes (Deleted)
Office Visit Note  Patient: Katherine Wiggins             Date of Birth: 1965/07/05           MRN: [id number]             PCP: Kerin Salen, PA-C Referring: Kathaleen Bury* Visit Date: 07/22/2022 Occupation: @GUAROCC @  Subjective:  No chief complaint on file.   History of Present Illness: Katherine Wiggins is a 57 y.o. female ***patient was evaluated by Dr. Salena Saner in 2016 with history of arthralgias, myalgias and positive rheumatoid factor.  She was given a trial of Plaquenil.    Activities of Daily Living:  Patient reports morning stiffness for *** {minute/hour:19697}.   Patient {ACTIONS;DENIES/REPORTS:21021675::"Denies"} nocturnal pain.  Difficulty dressing/grooming: {ACTIONS;DENIES/REPORTS:21021675::"Denies"} Difficulty climbing stairs: {ACTIONS;DENIES/REPORTS:21021675::"Denies"} Difficulty getting out of chair: {ACTIONS;DENIES/REPORTS:21021675::"Denies"} Difficulty using hands for taps, buttons, cutlery, and/or writing: {ACTIONS;DENIES/REPORTS:21021675::"Denies"}  No Rheumatology ROS completed.   PMFS History:  Patient Active Problem List   Diagnosis Date Noted   Hypertension, essential 08/05/2018   Elevated cholesterol 08/05/2018   Rheumatoid arthritis 08/05/2018   Abnormal uterine bleeding (AUB) 04/20/2018    Past Medical History:  Diagnosis Date   Asthma    Depression    Endometriosis    Fibromyalgia    High cholesterol    History of salpingectomy    Right   Hypertension    RA (rheumatoid arthritis)     Family History  Problem Relation Age of Onset   Diabetes Mother    Hypertension Mother    Diabetes Sister    Hypertension Sister    Hypertension Brother    Past Surgical History:  Procedure Laterality Date   CERVICAL SPINE SURGERY     RIGHT OOPHORECTOMY Right 1995   SALPINGECTOMY Right 1995   TUBAL LIGATION     Social History   Social History Narrative   Not on file    There is no immunization history on file for this  patient.   Objective: Vital Signs: LMP 02/15/2021    Physical Exam   Musculoskeletal Exam: ***  CDAI Exam: CDAI Score: -- Patient Global: --; Provider Global: -- Swollen: --; Tender: -- Joint Exam 07/22/2022   No joint exam has been documented for this visit   There is currently no information documented on the homunculus. Go to the Rheumatology activity and complete the homunculus joint exam.  Investigation: No additional findings.  Imaging: No results found.  Recent Labs: Lab Results  Component Value Date   WBC 7.3 08/22/2019   HGB 10.9 (L) 08/22/2019   PLT 363 08/22/2019   NA 137 08/22/2019   K 3.2 (L) 08/22/2019   CL 103 08/22/2019   CO2 24 08/22/2019   GLUCOSE 137 (H) 08/22/2019   BUN 13 08/22/2019   CREATININE 0.84 08/22/2019   BILITOT 0.3 08/22/2019   ALKPHOS 54 08/22/2019   AST 75 (H) 08/22/2019   ALT 213 (H) 08/22/2019   PROT 7.9 08/22/2019   ALBUMIN 3.5 08/22/2019   CALCIUM 8.9 08/22/2019   GFRAA >60 08/22/2019   January 05, 2015 sed rate 52, anti-CCP antibody 47, ANA negative, TB Gold negative, hepatitis C negative, hepatitis B negative  January 25, 2016 RF 632, anti-CCP 23, sed rate 72 Speciality Comments: No specialty comments available.  Procedures:  No procedures performed Allergies: Codeine, Nitrofurantoin, Sulfamethoxazole-trimethoprim, and Septra [sulfamethoxazole w/trimethoprim (co-trimoxazole)]   Assessment / Plan:     Visit Diagnoses: Polyarthralgia  Myalgia  Rheumatoid factor positive  DDD (degenerative disc disease),  cervical  Chronic midline low back pain without sciatica  Fibromyalgia  Hypertension, essential  Elevated cholesterol  History of depression  Primary insomnia  History of gastroesophageal reflux (GERD)  Iron deficiency anemia due to chronic blood loss  Pulmonary nodule  Herpes simplex vulvovaginitis  Orders: No orders of the defined types were placed in this encounter.  No orders of the defined  types were placed in this encounter.   Face-to-face time spent with patient was *** minutes. Greater than 50% of time was spent in counseling and coordination of care.  Follow-Up Instructions: No follow-ups on file.   Pollyann Savoy, MD  Note - This record has been created using Animal nutritionist.  Chart creation errors have been sought, but may not always  have been located. Such creation errors do not reflect on  the standard of medical care.

## 2022-07-18 ENCOUNTER — Other Ambulatory Visit: Payer: Self-pay

## 2022-07-18 ENCOUNTER — Other Ambulatory Visit (HOSPITAL_BASED_OUTPATIENT_CLINIC_OR_DEPARTMENT_OTHER): Payer: Self-pay

## 2022-07-18 MED ORDER — METOPROLOL SUCCINATE ER 100 MG PO TB24
100.0000 mg | ORAL_TABLET | Freq: Every morning | ORAL | 3 refills | Status: AC
  Filled 2022-07-18 – 2022-07-24 (×2): qty 30, 30d supply, fill #0
  Filled 2022-08-24: qty 30, 30d supply, fill #1
  Filled 2022-09-25: qty 30, 30d supply, fill #2
  Filled 2022-11-03: qty 30, 30d supply, fill #3
  Filled 2022-12-06: qty 30, 30d supply, fill #4
  Filled 2023-01-09: qty 30, 30d supply, fill #5
  Filled 2023-02-05: qty 30, 30d supply, fill #6
  Filled 2023-03-12: qty 30, 30d supply, fill #7
  Filled 2023-04-10: qty 30, 30d supply, fill #8
  Filled 2023-05-13: qty 30, 30d supply, fill #9
  Filled 2023-06-10: qty 30, 30d supply, fill #10
  Filled 2023-07-10: qty 30, 30d supply, fill #11

## 2022-07-19 ENCOUNTER — Other Ambulatory Visit (HOSPITAL_BASED_OUTPATIENT_CLINIC_OR_DEPARTMENT_OTHER): Payer: Self-pay

## 2022-07-19 ENCOUNTER — Telehealth: Payer: Self-pay

## 2022-07-19 NOTE — Telephone Encounter (Signed)
Patient contacted the office stating someone had called her and that she has an appointment on 07/22/2022. After reviewing the chart, I did not see where anyone has tried to contact her. Advised patient it could have been an appointment reminder. Patient verbalized understanding.

## 2022-07-22 ENCOUNTER — Encounter: Payer: No Typology Code available for payment source | Admitting: Rheumatology

## 2022-07-22 DIAGNOSIS — I10 Essential (primary) hypertension: Secondary | ICD-10-CM

## 2022-07-22 DIAGNOSIS — Z8719 Personal history of other diseases of the digestive system: Secondary | ICD-10-CM

## 2022-07-22 DIAGNOSIS — F5101 Primary insomnia: Secondary | ICD-10-CM

## 2022-07-22 DIAGNOSIS — R911 Solitary pulmonary nodule: Secondary | ICD-10-CM

## 2022-07-22 DIAGNOSIS — Z8659 Personal history of other mental and behavioral disorders: Secondary | ICD-10-CM

## 2022-07-22 DIAGNOSIS — M791 Myalgia, unspecified site: Secondary | ICD-10-CM

## 2022-07-22 DIAGNOSIS — M797 Fibromyalgia: Secondary | ICD-10-CM

## 2022-07-22 DIAGNOSIS — E7800 Pure hypercholesterolemia, unspecified: Secondary | ICD-10-CM

## 2022-07-22 DIAGNOSIS — M5030 Other cervical disc degeneration, unspecified cervical region: Secondary | ICD-10-CM

## 2022-07-22 DIAGNOSIS — D500 Iron deficiency anemia secondary to blood loss (chronic): Secondary | ICD-10-CM

## 2022-07-22 DIAGNOSIS — M545 Low back pain, unspecified: Secondary | ICD-10-CM

## 2022-07-22 DIAGNOSIS — R768 Other specified abnormal immunological findings in serum: Secondary | ICD-10-CM

## 2022-07-22 DIAGNOSIS — A6004 Herpesviral vulvovaginitis: Secondary | ICD-10-CM

## 2022-07-22 DIAGNOSIS — M2550 Pain in unspecified joint: Secondary | ICD-10-CM

## 2022-07-23 ENCOUNTER — Other Ambulatory Visit (HOSPITAL_BASED_OUTPATIENT_CLINIC_OR_DEPARTMENT_OTHER): Payer: Self-pay

## 2022-07-24 ENCOUNTER — Other Ambulatory Visit (HOSPITAL_BASED_OUTPATIENT_CLINIC_OR_DEPARTMENT_OTHER): Payer: Self-pay

## 2022-07-25 ENCOUNTER — Other Ambulatory Visit (HOSPITAL_BASED_OUTPATIENT_CLINIC_OR_DEPARTMENT_OTHER): Payer: Self-pay

## 2022-07-25 MED ORDER — VALACYCLOVIR HCL 500 MG PO TABS
500.0000 mg | ORAL_TABLET | Freq: Every day | ORAL | 3 refills | Status: AC
  Filled 2022-07-25: qty 30, 30d supply, fill #0
  Filled 2022-08-19: qty 30, 30d supply, fill #1
  Filled 2022-09-16: qty 30, 30d supply, fill #2
  Filled 2022-10-16: qty 30, 30d supply, fill #3
  Filled 2022-11-15: qty 30, 30d supply, fill #4
  Filled 2022-12-16: qty 30, 30d supply, fill #5
  Filled 2023-01-16: qty 30, 30d supply, fill #6
  Filled 2023-02-17: qty 30, 30d supply, fill #7
  Filled 2023-03-24: qty 30, 30d supply, fill #8
  Filled 2023-04-21 – 2023-05-06 (×2): qty 30, 30d supply, fill #9
  Filled 2023-05-30: qty 30, 30d supply, fill #10
  Filled 2023-06-30: qty 30, 30d supply, fill #11

## 2022-07-29 ENCOUNTER — Other Ambulatory Visit (HOSPITAL_BASED_OUTPATIENT_CLINIC_OR_DEPARTMENT_OTHER): Payer: Self-pay

## 2022-07-29 MED ORDER — CYCLOBENZAPRINE HCL 10 MG PO TABS
10.0000 mg | ORAL_TABLET | Freq: Three times a day (TID) | ORAL | 0 refills | Status: AC | PRN
  Filled 2022-07-29: qty 21, 7d supply, fill #0

## 2022-07-29 MED ORDER — NAPROXEN 500 MG PO TABS
500.0000 mg | ORAL_TABLET | Freq: Two times a day (BID) | ORAL | 0 refills | Status: AC
  Filled 2022-07-29: qty 14, 7d supply, fill #0

## 2022-07-29 MED ORDER — LIDOCAINE 5 % EX PTCH
1.0000 | MEDICATED_PATCH | Freq: Every day | CUTANEOUS | 0 refills | Status: AC
  Filled 2022-07-29: qty 30, 30d supply, fill #0

## 2022-08-06 ENCOUNTER — Other Ambulatory Visit: Payer: Self-pay

## 2022-08-08 ENCOUNTER — Other Ambulatory Visit (HOSPITAL_BASED_OUTPATIENT_CLINIC_OR_DEPARTMENT_OTHER): Payer: Self-pay

## 2022-08-08 MED ORDER — METHYLPREDNISOLONE 4 MG PO TBPK
ORAL_TABLET | ORAL | 0 refills | Status: AC
  Filled 2022-08-08: qty 21, 6d supply, fill #0

## 2022-08-08 MED ORDER — MELOXICAM 15 MG PO TABS
15.0000 mg | ORAL_TABLET | Freq: Every day | ORAL | 2 refills | Status: DC
  Filled 2022-08-08: qty 30, 30d supply, fill #0
  Filled 2022-09-02: qty 30, 30d supply, fill #1
  Filled 2022-10-01: qty 30, 30d supply, fill #2

## 2022-08-30 ENCOUNTER — Other Ambulatory Visit (HOSPITAL_BASED_OUTPATIENT_CLINIC_OR_DEPARTMENT_OTHER): Payer: Self-pay

## 2022-09-04 ENCOUNTER — Other Ambulatory Visit (HOSPITAL_BASED_OUTPATIENT_CLINIC_OR_DEPARTMENT_OTHER): Payer: Self-pay

## 2022-09-06 ENCOUNTER — Other Ambulatory Visit (HOSPITAL_BASED_OUTPATIENT_CLINIC_OR_DEPARTMENT_OTHER): Payer: Self-pay

## 2022-09-11 ENCOUNTER — Other Ambulatory Visit (HOSPITAL_BASED_OUTPATIENT_CLINIC_OR_DEPARTMENT_OTHER): Payer: Self-pay

## 2022-09-11 MED ORDER — GABAPENTIN 300 MG PO CAPS
300.0000 mg | ORAL_CAPSULE | Freq: Every evening | ORAL | 2 refills | Status: DC
  Filled 2022-09-11: qty 30, 30d supply, fill #0
  Filled 2022-10-07: qty 30, 30d supply, fill #1
  Filled 2022-11-05: qty 30, 30d supply, fill #2

## 2022-09-25 ENCOUNTER — Other Ambulatory Visit (HOSPITAL_BASED_OUTPATIENT_CLINIC_OR_DEPARTMENT_OTHER): Payer: Self-pay

## 2022-10-09 ENCOUNTER — Other Ambulatory Visit (HOSPITAL_BASED_OUTPATIENT_CLINIC_OR_DEPARTMENT_OTHER): Payer: Self-pay

## 2022-10-11 ENCOUNTER — Other Ambulatory Visit (HOSPITAL_BASED_OUTPATIENT_CLINIC_OR_DEPARTMENT_OTHER): Payer: Self-pay

## 2022-10-15 ENCOUNTER — Other Ambulatory Visit: Payer: Self-pay

## 2022-10-15 ENCOUNTER — Other Ambulatory Visit (HOSPITAL_BASED_OUTPATIENT_CLINIC_OR_DEPARTMENT_OTHER): Payer: Self-pay

## 2022-10-15 MED ORDER — SIMVASTATIN 40 MG PO TABS
40.0000 mg | ORAL_TABLET | Freq: Every day | ORAL | 0 refills | Status: DC
  Filled 2022-10-15: qty 30, 30d supply, fill #0
  Filled 2022-11-15: qty 30, 30d supply, fill #1
  Filled 2022-12-16 – 2022-12-19 (×2): qty 30, 30d supply, fill #2

## 2022-10-18 ENCOUNTER — Other Ambulatory Visit (HOSPITAL_BASED_OUTPATIENT_CLINIC_OR_DEPARTMENT_OTHER): Payer: Self-pay

## 2022-10-18 ENCOUNTER — Other Ambulatory Visit: Payer: Self-pay

## 2022-10-18 MED ORDER — METRONIDAZOLE 500 MG PO TABS
500.0000 mg | ORAL_TABLET | Freq: Three times a day (TID) | ORAL | 0 refills | Status: AC
  Filled 2022-10-18: qty 21, 7d supply, fill #0

## 2022-10-18 MED ORDER — IBUPROFEN 800 MG PO TABS
800.0000 mg | ORAL_TABLET | Freq: Three times a day (TID) | ORAL | 0 refills | Status: AC
  Filled 2022-10-18: qty 21, 7d supply, fill #0

## 2022-11-04 ENCOUNTER — Other Ambulatory Visit (HOSPITAL_BASED_OUTPATIENT_CLINIC_OR_DEPARTMENT_OTHER): Payer: Self-pay

## 2022-11-04 ENCOUNTER — Other Ambulatory Visit: Payer: Self-pay

## 2022-11-05 ENCOUNTER — Other Ambulatory Visit (HOSPITAL_BASED_OUTPATIENT_CLINIC_OR_DEPARTMENT_OTHER): Payer: Self-pay

## 2022-11-15 ENCOUNTER — Other Ambulatory Visit (HOSPITAL_BASED_OUTPATIENT_CLINIC_OR_DEPARTMENT_OTHER): Payer: Self-pay

## 2022-11-22 ENCOUNTER — Other Ambulatory Visit (HOSPITAL_BASED_OUTPATIENT_CLINIC_OR_DEPARTMENT_OTHER): Payer: Self-pay

## 2022-12-03 ENCOUNTER — Other Ambulatory Visit (HOSPITAL_BASED_OUTPATIENT_CLINIC_OR_DEPARTMENT_OTHER): Payer: Self-pay

## 2022-12-05 ENCOUNTER — Other Ambulatory Visit (HOSPITAL_BASED_OUTPATIENT_CLINIC_OR_DEPARTMENT_OTHER): Payer: Self-pay

## 2022-12-06 ENCOUNTER — Other Ambulatory Visit (HOSPITAL_BASED_OUTPATIENT_CLINIC_OR_DEPARTMENT_OTHER): Payer: Self-pay

## 2022-12-06 MED ORDER — POTASSIUM CHLORIDE CRYS ER 20 MEQ PO TBCR
20.0000 meq | EXTENDED_RELEASE_TABLET | Freq: Every day | ORAL | 0 refills | Status: DC
  Filled 2022-12-06 – 2022-12-18 (×2): qty 30, 30d supply, fill #0
  Filled 2023-01-16: qty 30, 30d supply, fill #1
  Filled 2023-02-17: qty 30, 30d supply, fill #2

## 2022-12-09 ENCOUNTER — Other Ambulatory Visit: Payer: Self-pay

## 2022-12-09 ENCOUNTER — Other Ambulatory Visit (HOSPITAL_BASED_OUTPATIENT_CLINIC_OR_DEPARTMENT_OTHER): Payer: Self-pay

## 2022-12-10 ENCOUNTER — Other Ambulatory Visit (HOSPITAL_BASED_OUTPATIENT_CLINIC_OR_DEPARTMENT_OTHER): Payer: Self-pay

## 2022-12-10 MED ORDER — GABAPENTIN 300 MG PO CAPS
300.0000 mg | ORAL_CAPSULE | Freq: Every evening | ORAL | 2 refills | Status: DC
  Filled 2022-12-10 – 2022-12-18 (×2): qty 30, 30d supply, fill #0
  Filled 2023-01-16: qty 30, 30d supply, fill #1
  Filled 2023-02-17: qty 30, 30d supply, fill #2

## 2022-12-13 ENCOUNTER — Other Ambulatory Visit (HOSPITAL_BASED_OUTPATIENT_CLINIC_OR_DEPARTMENT_OTHER): Payer: Self-pay

## 2022-12-16 ENCOUNTER — Other Ambulatory Visit (HOSPITAL_BASED_OUTPATIENT_CLINIC_OR_DEPARTMENT_OTHER): Payer: Self-pay

## 2022-12-16 MED ORDER — PANTOPRAZOLE SODIUM 20 MG PO TBEC
20.0000 mg | DELAYED_RELEASE_TABLET | Freq: Every day | ORAL | 0 refills | Status: DC
  Filled 2022-12-16: qty 90, 90d supply, fill #0
  Filled 2022-12-18: qty 30, 30d supply, fill #0
  Filled 2023-01-16: qty 30, 30d supply, fill #1
  Filled 2023-02-17: qty 30, 30d supply, fill #2

## 2022-12-16 MED ORDER — HYDROCHLOROTHIAZIDE 25 MG PO TABS
25.0000 mg | ORAL_TABLET | Freq: Every day | ORAL | 0 refills | Status: DC
  Filled 2022-12-16: qty 90, 90d supply, fill #0
  Filled 2022-12-18: qty 30, 30d supply, fill #0
  Filled 2023-01-16: qty 30, 30d supply, fill #1
  Filled 2023-02-17: qty 30, 30d supply, fill #2

## 2022-12-16 MED ORDER — CITALOPRAM HYDROBROMIDE 40 MG PO TABS
40.0000 mg | ORAL_TABLET | Freq: Every day | ORAL | 0 refills | Status: DC
  Filled 2022-12-16: qty 90, 90d supply, fill #0
  Filled 2022-12-18: qty 30, 30d supply, fill #0
  Filled 2023-01-16: qty 30, 30d supply, fill #1
  Filled 2023-02-17: qty 30, 30d supply, fill #2

## 2022-12-17 ENCOUNTER — Other Ambulatory Visit: Payer: Self-pay

## 2022-12-17 ENCOUNTER — Other Ambulatory Visit (HOSPITAL_BASED_OUTPATIENT_CLINIC_OR_DEPARTMENT_OTHER): Payer: Self-pay

## 2022-12-17 ENCOUNTER — Encounter (HOSPITAL_BASED_OUTPATIENT_CLINIC_OR_DEPARTMENT_OTHER): Payer: Self-pay

## 2022-12-17 ENCOUNTER — Emergency Department (HOSPITAL_BASED_OUTPATIENT_CLINIC_OR_DEPARTMENT_OTHER)
Admission: EM | Admit: 2022-12-17 | Discharge: 2022-12-17 | Disposition: A | Discharge status: Home / Self Care | Payer: No Typology Code available for payment source | Attending: Emergency Medicine | Admitting: Emergency Medicine

## 2022-12-17 DIAGNOSIS — N898 Other specified noninflammatory disorders of vagina: Secondary | ICD-10-CM

## 2022-12-17 DIAGNOSIS — Z7982 Long term (current) use of aspirin: Secondary | ICD-10-CM | POA: Insufficient documentation

## 2022-12-17 LAB — PREGNANCY, URINE: Preg Test, Ur: NEGATIVE

## 2022-12-17 LAB — URINALYSIS, ROUTINE W REFLEX MICROSCOPIC
Bilirubin Urine: NEGATIVE
Glucose, UA: NEGATIVE mg/dL
Hgb urine dipstick: NEGATIVE
Ketones, ur: NEGATIVE mg/dL
Leukocytes,Ua: NEGATIVE
Nitrite: NEGATIVE
Protein, ur: NEGATIVE mg/dL
Specific Gravity, Urine: 1.015 (ref 1.005–1.030)
pH: 5 (ref 5.0–8.0)

## 2022-12-17 LAB — WET PREP, GENITAL
Clue Cells Wet Prep HPF POC: NONE SEEN
Sperm: NONE SEEN
Trich, Wet Prep: NONE SEEN
WBC, Wet Prep HPF POC: >=10 — AB (ref <10)
Yeast Wet Prep HPF POC: NONE SEEN

## 2022-12-17 MED ORDER — FLUCONAZOLE 150 MG PO TABS
150.0000 mg | ORAL_TABLET | Freq: Every day | ORAL | 0 refills | Status: AC
  Filled 2022-12-17 – 2022-12-18 (×2): qty 1, 1d supply, fill #0

## 2022-12-17 MED ORDER — NYSTATIN 100000 UNIT/GM EX CREA
TOPICAL_CREAM | CUTANEOUS | 0 refills | Status: AC
Start: 2022-12-17 — End: ?
  Filled 2022-12-17 – 2022-12-18 (×2): qty 30, 30d supply, fill #0

## 2022-12-17 MED ORDER — ACETAMINOPHEN 500 MG PO TABS: 1000.0000 mg | ORAL_TABLET | Freq: Once | ORAL | Status: DC

## 2022-12-17 NOTE — ED Triage Notes (Addendum)
Pt reports vaginal itching and yellow discharge for one week. Inner and outer irritated . Has tried OTC monistat., Pt reports still having cycles had cycle 10-15 and symptoms started 16th

## 2022-12-17 NOTE — Discharge Instructions (Addendum)
It was a pleasure caring for you today in the emergency department.  We will treat you with a medication called Fluconazole for your vaginal yeast infection symptoms. Take this pill once.  Use topical nystatin powder, also sent to the pharmacy, for the irritation on your skin.  Please return to the emergency department for any worsening or worrisome symptoms.

## 2022-12-17 NOTE — ED Provider Notes (Signed)
[facility] Provider Note   CSN: [id number] Arrival date & time: 12/17/22  4098     History  Chief Complaint  Patient presents with   Vaginal Itching    Katherine Wiggins is a 57 y.o. female with vaginal itching, increase in thick white discharge for 1 week.  Also having tenderness, irritation in the intertriginous area.  Says she is never had anything like this before.  Sexually active with one partner.  Would like STI screening. LMP 9/15.       Home Medications Prior to Admission medications   Medication Sig Start Date End Date Taking? Authorizing Provider  fluconazole (DIFLUCAN) 150 MG tablet Take 1 tablet (150 mg total) by mouth daily. 12/17/22  Yes Anijah Spohr, Nolberto Hanlon, DO  nystatin cream (MYCOSTATIN) Apply to affected area 2 times daily 12/17/22  Yes Lukisha Procida, Nolberto Hanlon, DO  albuterol (VENTOLIN HFA) 108 (90 Base) MCG/ACT inhaler INHALE 1 - 2 PUFFS INTO THE LUNGS EVERY 6 HOURS AS NEEDED FOR WHEEZING OR SHORTNESS OF BREATH 04/03/20 04/03/21  Long, Arlyss Repress, MD  albuterol (VENTOLIN HFA) 108 (90 Base) MCG/ACT inhaler INHALE 1 - 2 PUFFS INTO THE LUNGS EVERY 6 HOURS AS NEEDED FOR WHEEZING OR SHORTNESS OF BREATH 06/21/21     amitriptyline (ELAVIL) 25 MG tablet Take 1 tablet (25 mg total) by mouth nightly. 06/21/21     amoxicillin (AMOXIL) 500 MG capsule Take 1 capsule (500 mg total) by mouth three times a day with food until gone. 04/16/21     aspirin 81 MG tablet Take 81 mg by mouth daily.    [provider]  cefdinir (OMNICEF) 300 MG capsule Take 1 capsule (300 mg total) by mouth 2 (two) times daily. 07/04/22   Peter Garter, PA  cetirizine (ZYRTEC) 10 MG tablet TAKE 1 TABLET (10 MG TOTAL) BY MOUTH DAILY. 01/03/20 01/02/21  Jeannie Fend, PA-C  citalopram (CELEXA) 40 MG tablet TAKE 1 TABLET (40 MG TOTAL) BY MOUTH DAILY. 05/02/20 05/02/21  Kerin Salen, PA-C  citalopram (CELEXA) 40 MG tablet Take 1 tablet (40 mg total) by mouth  daily. **NEEDS AN OFFICE VISIT** 12/16/22     cyclobenzaprine (FLEXERIL) 10 MG tablet Take 1 tablet (10 mg total) by mouth 3 (three) times daily as needed for msucle spasms for up to 7 days. 07/28/22     estradiol (ESTRACE) 0.1 MG/GM vaginal cream Apply 1 gram per vagina every night for 2 weeks, then apply three times a week 01/21/22   Lorriane Shire, MD  fluticasone (FLONASE) 50 MCG/ACT nasal spray PLACE 1 SPRAY INTO BOTH NOSTRILS DAILY. 01/03/20 01/02/21  Jeannie Fend, PA-C  gabapentin (NEURONTIN) 300 MG capsule Take 1 capsule (300 mg total) by mouth Nightly. 12/10/22     gatifloxacin (ZYMAXID) 0.5 % SOLN Place 1 drop into the left eye four times daily as directed Patient not taking: Reported on 01/16/2022 06/05/21   Mia Creek, MD  hydrochlorothiazide (HYDRODIURIL) 25 MG tablet Take by mouth. 05/27/18   [provider]  hydrochlorothiazide (HYDRODIURIL) 25 MG tablet TAKE 1 TABLET (25 MG TOTAL) BY MOUTH DAILY. 05/02/20 05/02/21  Kerin Salen, PA-C  hydrochlorothiazide (HYDRODIURIL) 25 MG tablet Take 1 tablet (25 mg total) by mouth daily. **NEEDS AN OFFICE VISIT** 12/16/22     ketorolac (ACULAR) 0.5 % ophthalmic solution Place 1 drop into the left eye four times daily as directed Patient not taking: Reported on 01/16/2022 06/05/21   Mia Creek, MD  lidocaine (LIDODERM) 5 %  Place 1 patch onto the skin daily. Remove and discard patch within 12 hours or as directed ny MD. 07/28/22     LORazepam (ATIVAN) 2 MG tablet Take 1 tablet by mouth 1 hour pre-op with a small sip of water 10/19/20     meloxicam (MOBIC) 15 MG tablet Take 1 tablet (15 mg total) by mouth daily. 08/08/22     methylPREDNISolone (MEDROL DOSEPAK) 4 MG TBPK tablet Take As Directed On Package 08/08/22     metoprolol succinate (TOPROL-XL) 100 MG 24 hr tablet TAKE 1 TABLET (100 MG TOTAL) BY MOUTH EVERY MORNING. 05/02/20 05/02/21  Kerin Salen, PA-C  metoprolol succinate (TOPROL-XL) 100 MG 24 hr tablet Take 1  tablet (100 mg total) by mouth every morning. **NO MORE REFILLS-NEEDS AN OFFICE VISIT** Patient not taking: Reported on 01/16/2022 05/21/21     metoprolol succinate (TOPROL-XL) 100 MG 24 hr tablet Take 1 tablet (100 mg total) by mouth every morning. 07/18/22     metoprolol succinate (TOPROL-XL) 25 MG 24 hr tablet TAKE 1 TABLET BY MOUTH DAILY WITH 100MG  TABLET **NEEDS AN OFFICE VISIT** 02/09/20 01/16/22  Kerin Salen, PA-C  metoprolol succinate (TOPROL-XL) 25 MG 24 hr tablet TAKE 1 TABLET (25 MG TOTAL) BY MOUTH DAILY WITH 100 MG TABLET. 06/21/21     metoprolol succinate (TOPROL-XL) 50 MG 24 hr tablet Take 1 tablet (50 mg total) by mouth at bedtime. Continue metoprolol ER 100 mg every morning. 05/01/22     metoprolol tartrate (LOPRESSOR) 100 MG tablet Take 100 mg by mouth 2 (two) times daily.    [provider]  naproxen (NAPROSYN) 500 MG tablet Take 1 tablet (500 mg total) by mouth 2 (two) times daily with a meal. 07/28/22     ondansetron (ZOFRAN-ODT) 4 MG disintegrating tablet Place 1 tablet on the top of tongue where they will dissolve, then swallow every 8 hours as needed for nausea/vomiting 10/19/20     pantoprazole (PROTONIX) 20 MG tablet TAKE 1 TABLET (20 MG TOTAL) BY MOUTH DAILY. 05/02/20 05/02/21  Kerin Salen, PA-C  pantoprazole (PROTONIX) 20 MG tablet TAKE 1 TABLET (20 MG TOTAL) BY MOUTH DAILY. **NEEDS AN OFFICE VISIT** 02/04/20 02/03/21  Kerin Salen, PA-C  pantoprazole (PROTONIX) 20 MG tablet Take 1 tablet (20 mg total) by mouth every morning before breakfast. **NEEDS AN OFFICE VISIT** 12/16/22     potassium chloride SA (KLOR-CON M) 20 MEQ tablet TAKE 1 TABLET (20 MEQ TOTAL) BY MOUTH DAILY. 05/02/20 05/27/21  Kerin Salen, PA-C  potassium chloride SA (KLOR-CON M) 20 MEQ tablet Take 1 tablet (20 mEq total) by mouth daily. **NEEDS AN OFFICE VISIT-NO MORE REFILLS** 12/06/22     prednisoLONE acetate (PRED FORTE) 1 % ophthalmic suspension Place 1  drop into the left eye four times daily as directed. **Shake well before each use** Patient not taking: Reported on 01/16/2022 06/05/21   Mia Creek, MD  predniSONE (DELTASONE) 50 MG tablet Take 1 tablet (50 mg total) by mouth daily with breakfast. 06/02/22   Chestine Spore, Meghan R, PA-C  simvastatin (ZOCOR) 40 MG tablet TAKE 1 TABLET (40 MG TOTAL) BY MOUTH DAILY. 05/02/20 05/02/21  Kerin Salen, PA-C  simvastatin (ZOCOR) 40 MG tablet TAKE 1 TABLET BY MOUTH ONCE DAILY 05/24/19 05/23/20  Kerin Salen, PA-C  simvastatin (ZOCOR) 40 MG tablet Take 1 tablet (40 mg total) by mouth daily.**NEED AN OFFICE VISIT.** 10/15/22     valACYclovir (VALTREX) 500 MG tablet Take 1 tablet (500 mg total) by mouth  daily. 07/25/22         Allergies    Codeine, Nitrofurantoin, Sulfamethoxazole-trimethoprim, and Septra [sulfamethoxazole w/trimethoprim (co-trimoxazole)]    Review of Systems   Review of Systems  Constitutional:  Negative for chills and fever.  Genitourinary:  Positive for vaginal discharge.    Physical Exam Updated Vital Signs BP 122/72   Pulse 66   Temp 98 F (36.7 C) (Oral)   Resp 16   Ht 5\' 3"  (1.6 m)   Wt 81.6 kg   LMP 02/15/2021   SpO2 97%   BMI 31.89 kg/m  Physical Exam Constitutional:      Appearance: Normal appearance.  Cardiovascular:     Rate and Rhythm: Normal rate and regular rhythm.  Pulmonary:     Effort: Pulmonary effort is normal.     Breath sounds: Normal breath sounds.  Abdominal:     General: Abdomen is flat.     Palpations: Abdomen is soft.  Genitourinary:    General: Normal vulva.     Comments: RN present as chaperone during exam.  Intertrigo appreciated inguinal folds. Copious amounts of thick, white discharge pooling in the posterior fornix.  Cervical os closed, no friability.  No cervical motion tenderness. Neurological:     Mental Status: She is alert.     ED Results / Procedures / Treatments   Labs (all labs ordered are listed, but only  abnormal results are displayed) Labs Reviewed  WET PREP, GENITAL - Abnormal; Notable for the following components:      Result Value   WBC, Wet Prep HPF POC >=10 (*)    All other components within normal limits  URINALYSIS, ROUTINE W REFLEX MICROSCOPIC  PREGNANCY, URINE  GC/CHLAMYDIA PROBE AMP ([place]) NOT AT [facility]    EKG EKG Interpretation Date/Time:  Tuesday December 17 2022 10:16:58 EDT Ventricular Rate:  103 PR Interval:  121 QRS Duration:  79 QT Interval:  337 QTC Calculation: 442 R Axis:   81  Text Interpretation: Sinus tachycardia Since last tracing rate faster Otherwise no significant change Confirmed by Melene Plan [phone]) on 12/17/2022 10:39:16 AM  Radiology No results found.  Procedures Procedures    Medications Ordered in ED Medications - No data to display  ED Course/ Medical Decision Making/ A&P                                 Medical Decision Making No gross abnormalities on pelvic exam other than increased white, thick discharge.    Wet prep negative for trichomoniasis, yeast, bacterial vaginosis but opted to treat symptomatically for candidal vaginitis given thick, copious white discharge on pelvic exam. Pending gonorrhea, chlamydia, will follow-up on these results. Urinalysis and urine pregnancy negative. Prescribed nystatin powder for treatment of inguinal intertrigo. Encouraged safe sex practices with barrier methods.  Amount and/or Complexity of Data Reviewed Labs: ordered.  Risk Prescription drug management.           Final Clinical Impression(s) / ED Diagnoses Final diagnoses:  Vaginal discharge  Vaginal irritation    Rx / DC Orders ED Discharge Orders          Ordered    fluconazole (DIFLUCAN) 150 MG tablet  Daily        12/17/22 1147    nystatin cream (MYCOSTATIN)        12/17/22 1147              Darral Dash, DO 12/17/22  1210    Melene Plan, DO 12/17/22 1229

## 2022-12-18 ENCOUNTER — Other Ambulatory Visit (HOSPITAL_BASED_OUTPATIENT_CLINIC_OR_DEPARTMENT_OTHER): Payer: Self-pay

## 2022-12-18 ENCOUNTER — Other Ambulatory Visit: Payer: Self-pay

## 2022-12-18 LAB — GC/CHLAMYDIA PROBE AMP (~~LOC~~) NOT AT ARMC
Chlamydia: NEGATIVE
Comment: NEGATIVE
Comment: NORMAL
Neisseria Gonorrhea: NEGATIVE

## 2022-12-19 ENCOUNTER — Other Ambulatory Visit: Payer: Self-pay

## 2022-12-24 ENCOUNTER — Other Ambulatory Visit (HOSPITAL_BASED_OUTPATIENT_CLINIC_OR_DEPARTMENT_OTHER): Payer: Self-pay | Admitting: Emergency Medicine

## 2022-12-24 ENCOUNTER — Other Ambulatory Visit: Payer: Self-pay

## 2022-12-24 ENCOUNTER — Other Ambulatory Visit (HOSPITAL_BASED_OUTPATIENT_CLINIC_OR_DEPARTMENT_OTHER): Payer: Self-pay

## 2022-12-25 ENCOUNTER — Other Ambulatory Visit: Payer: Self-pay

## 2022-12-25 ENCOUNTER — Other Ambulatory Visit (HOSPITAL_BASED_OUTPATIENT_CLINIC_OR_DEPARTMENT_OTHER): Payer: Self-pay

## 2022-12-25 MED ORDER — MELOXICAM 15 MG PO TABS
15.0000 mg | ORAL_TABLET | Freq: Every day | ORAL | 2 refills | Status: DC
Start: 2022-12-25 — End: 2023-04-20
  Filled 2022-12-25 – 2023-01-10 (×2): qty 30, 30d supply, fill #0
  Filled 2023-02-17: qty 30, 30d supply, fill #1
  Filled 2023-03-24: qty 30, 30d supply, fill #2

## 2023-01-02 ENCOUNTER — Other Ambulatory Visit (HOSPITAL_BASED_OUTPATIENT_CLINIC_OR_DEPARTMENT_OTHER): Payer: Self-pay

## 2023-01-08 ENCOUNTER — Other Ambulatory Visit (HOSPITAL_BASED_OUTPATIENT_CLINIC_OR_DEPARTMENT_OTHER): Payer: Self-pay

## 2023-01-09 ENCOUNTER — Other Ambulatory Visit (HOSPITAL_COMMUNITY): Payer: Self-pay

## 2023-01-09 ENCOUNTER — Other Ambulatory Visit (HOSPITAL_BASED_OUTPATIENT_CLINIC_OR_DEPARTMENT_OTHER): Payer: Self-pay

## 2023-01-10 ENCOUNTER — Other Ambulatory Visit (HOSPITAL_BASED_OUTPATIENT_CLINIC_OR_DEPARTMENT_OTHER): Payer: Self-pay

## 2023-01-16 ENCOUNTER — Other Ambulatory Visit (HOSPITAL_BASED_OUTPATIENT_CLINIC_OR_DEPARTMENT_OTHER): Payer: Self-pay

## 2023-01-16 MED ORDER — SIMVASTATIN 40 MG PO TABS
40.0000 mg | ORAL_TABLET | Freq: Every day | ORAL | 0 refills | Status: AC
  Filled 2023-01-16: qty 90, 90d supply, fill #0

## 2023-01-23 ENCOUNTER — Other Ambulatory Visit (HOSPITAL_BASED_OUTPATIENT_CLINIC_OR_DEPARTMENT_OTHER): Payer: Self-pay

## 2023-01-25 ENCOUNTER — Other Ambulatory Visit (HOSPITAL_BASED_OUTPATIENT_CLINIC_OR_DEPARTMENT_OTHER): Payer: Self-pay

## 2023-01-28 ENCOUNTER — Other Ambulatory Visit (HOSPITAL_BASED_OUTPATIENT_CLINIC_OR_DEPARTMENT_OTHER): Payer: Self-pay

## 2023-01-28 MED ORDER — BUPROPION HCL ER (XL) 150 MG PO TB24
150.0000 mg | ORAL_TABLET | Freq: Every morning | ORAL | 0 refills | Status: AC
  Filled 2023-01-28: qty 90, 90d supply, fill #0

## 2023-02-03 ENCOUNTER — Other Ambulatory Visit (HOSPITAL_BASED_OUTPATIENT_CLINIC_OR_DEPARTMENT_OTHER): Payer: Self-pay

## 2023-02-06 ENCOUNTER — Other Ambulatory Visit (HOSPITAL_BASED_OUTPATIENT_CLINIC_OR_DEPARTMENT_OTHER): Payer: Self-pay

## 2023-02-17 ENCOUNTER — Other Ambulatory Visit (HOSPITAL_BASED_OUTPATIENT_CLINIC_OR_DEPARTMENT_OTHER): Payer: Self-pay

## 2023-02-24 ENCOUNTER — Other Ambulatory Visit (HOSPITAL_BASED_OUTPATIENT_CLINIC_OR_DEPARTMENT_OTHER): Payer: Self-pay

## 2023-02-28 ENCOUNTER — Other Ambulatory Visit (HOSPITAL_BASED_OUTPATIENT_CLINIC_OR_DEPARTMENT_OTHER): Payer: Self-pay

## 2023-02-28 MED ORDER — BENZONATATE 100 MG PO CAPS
100.0000 mg | ORAL_CAPSULE | Freq: Three times a day (TID) | ORAL | 0 refills | Status: DC
  Filled 2023-02-28 – 2023-03-11 (×2): qty 20, 7d supply, fill #0

## 2023-03-03 ENCOUNTER — Other Ambulatory Visit (HOSPITAL_BASED_OUTPATIENT_CLINIC_OR_DEPARTMENT_OTHER): Payer: Self-pay

## 2023-03-10 ENCOUNTER — Other Ambulatory Visit (HOSPITAL_BASED_OUTPATIENT_CLINIC_OR_DEPARTMENT_OTHER): Payer: Self-pay

## 2023-03-11 ENCOUNTER — Other Ambulatory Visit (HOSPITAL_BASED_OUTPATIENT_CLINIC_OR_DEPARTMENT_OTHER): Payer: Self-pay

## 2023-03-11 ENCOUNTER — Other Ambulatory Visit: Payer: Self-pay

## 2023-03-12 ENCOUNTER — Other Ambulatory Visit (HOSPITAL_BASED_OUTPATIENT_CLINIC_OR_DEPARTMENT_OTHER): Payer: Self-pay

## 2023-03-24 ENCOUNTER — Other Ambulatory Visit: Payer: Self-pay

## 2023-03-24 ENCOUNTER — Other Ambulatory Visit (HOSPITAL_BASED_OUTPATIENT_CLINIC_OR_DEPARTMENT_OTHER): Payer: Self-pay

## 2023-03-24 MED ORDER — GABAPENTIN 300 MG PO CAPS
300.0000 mg | ORAL_CAPSULE | Freq: Every evening | ORAL | 2 refills | Status: DC
  Filled 2023-03-24: qty 30, 30d supply, fill #0
  Filled 2023-04-21 – 2023-05-06 (×2): qty 30, 30d supply, fill #1
  Filled 2023-05-30: qty 30, 30d supply, fill #2

## 2023-03-25 ENCOUNTER — Other Ambulatory Visit (HOSPITAL_BASED_OUTPATIENT_CLINIC_OR_DEPARTMENT_OTHER): Payer: Self-pay

## 2023-03-25 MED ORDER — CITALOPRAM HYDROBROMIDE 40 MG PO TABS
40.0000 mg | ORAL_TABLET | Freq: Every day | ORAL | 0 refills | Status: DC
  Filled 2023-03-25: qty 30, 30d supply, fill #0

## 2023-03-25 MED ORDER — PANTOPRAZOLE SODIUM 20 MG PO TBEC
20.0000 mg | DELAYED_RELEASE_TABLET | Freq: Every morning | ORAL | 0 refills | Status: DC
  Filled 2023-03-25: qty 30, 30d supply, fill #0

## 2023-03-25 MED ORDER — HYDROCHLOROTHIAZIDE 25 MG PO TABS
25.0000 mg | ORAL_TABLET | Freq: Every day | ORAL | 0 refills | Status: DC
  Filled 2023-03-25: qty 30, 30d supply, fill #0

## 2023-04-04 ENCOUNTER — Other Ambulatory Visit (HOSPITAL_BASED_OUTPATIENT_CLINIC_OR_DEPARTMENT_OTHER): Payer: Self-pay

## 2023-04-10 ENCOUNTER — Other Ambulatory Visit (HOSPITAL_BASED_OUTPATIENT_CLINIC_OR_DEPARTMENT_OTHER): Payer: Self-pay

## 2023-04-15 ENCOUNTER — Other Ambulatory Visit (HOSPITAL_BASED_OUTPATIENT_CLINIC_OR_DEPARTMENT_OTHER): Payer: Self-pay

## 2023-04-17 ENCOUNTER — Other Ambulatory Visit (HOSPITAL_BASED_OUTPATIENT_CLINIC_OR_DEPARTMENT_OTHER): Payer: Self-pay

## 2023-04-20 ENCOUNTER — Emergency Department (HOSPITAL_BASED_OUTPATIENT_CLINIC_OR_DEPARTMENT_OTHER)
Admission: EM | Admit: 2023-04-20 | Discharge: 2023-04-20 | Disposition: A | Discharge status: Home / Self Care | Payer: Medicaid Other | Attending: Emergency Medicine | Admitting: Emergency Medicine

## 2023-04-20 ENCOUNTER — Emergency Department (HOSPITAL_BASED_OUTPATIENT_CLINIC_OR_DEPARTMENT_OTHER): Payer: Medicaid Other

## 2023-04-20 ENCOUNTER — Other Ambulatory Visit: Payer: Self-pay

## 2023-04-20 ENCOUNTER — Encounter (HOSPITAL_BASED_OUTPATIENT_CLINIC_OR_DEPARTMENT_OTHER): Payer: Self-pay | Admitting: Emergency Medicine

## 2023-04-20 DIAGNOSIS — R059 Cough, unspecified: Secondary | ICD-10-CM | POA: Diagnosis not present

## 2023-04-20 DIAGNOSIS — M1712 Unilateral primary osteoarthritis, left knee: Secondary | ICD-10-CM | POA: Diagnosis not present

## 2023-04-20 DIAGNOSIS — Z20822 Contact with and (suspected) exposure to covid-19: Secondary | ICD-10-CM | POA: Insufficient documentation

## 2023-04-20 DIAGNOSIS — Z7982 Long term (current) use of aspirin: Secondary | ICD-10-CM | POA: Insufficient documentation

## 2023-04-20 DIAGNOSIS — B974 Respiratory syncytial virus as the cause of diseases classified elsewhere: Secondary | ICD-10-CM | POA: Diagnosis not present

## 2023-04-20 DIAGNOSIS — M25562 Pain in left knee: Secondary | ICD-10-CM | POA: Diagnosis present

## 2023-04-20 DIAGNOSIS — B338 Other specified viral diseases: Secondary | ICD-10-CM

## 2023-04-20 LAB — RESP PANEL BY RT-PCR (RSV, FLU A&B, COVID)  RVPGX2
Influenza A by PCR: NEGATIVE
Influenza B by PCR: NEGATIVE
Resp Syncytial Virus by PCR: POSITIVE — AB
SARS Coronavirus 2 by RT PCR: NEGATIVE

## 2023-04-20 MED ORDER — MELOXICAM 15 MG PO TABS
15.0000 mg | ORAL_TABLET | Freq: Every day | ORAL | 2 refills | Status: AC
  Filled 2023-04-20 – 2023-05-06 (×2): qty 30, 30d supply, fill #0
  Filled 2023-05-30: qty 30, 30d supply, fill #1
  Filled 2023-06-30: qty 30, 30d supply, fill #2

## 2023-04-20 NOTE — ED Triage Notes (Signed)
Pt reports L knee pain that has worsened over 2 weeks and is tender to touch. Is taking ibuprofen with no relief.   Pt reports R ear pain and a cough that has been going on over a week. Taking OTC meds dayquil with no relief.

## 2023-04-20 NOTE — ED Provider Notes (Signed)
[place] [facility] Provider Note   CSN: [id number] Arrival date & time: 04/20/23  7829     History  Chief Complaint  Patient presents with   Knee Pain   Ear Pain    Katherine Wiggins is a 58 y.o. female.  Patient complains of pain in her left knee.  Patient reports her knee is swollen.  Patient has a past medical history of degenerative hip disease.  Patient reports that the swelling in her knee began a couple weeks ago and present progressively is getting worse.  Patient also complains of pain in her right ear a cough and congestion.  Patient denies any shortness of breath.  Patient has had a fever on and off.  No chest pain.  The history is provided by the patient. No language interpreter was used.  Knee Pain Location:  Knee Injury: no   Knee location:  L knee Pain details:    Quality:  Aching   Radiates to:  Does not radiate   Severity:  Moderate   Timing:  Constant      Home Medications Prior to Admission medications   Medication Sig Start Date End Date Taking? Authorizing Provider  albuterol (VENTOLIN HFA) 108 (90 Base) MCG/ACT inhaler INHALE 1 - 2 PUFFS INTO THE LUNGS EVERY 6 HOURS AS NEEDED FOR WHEEZING OR SHORTNESS OF BREATH 04/03/20 04/03/21  Long, Arlyss Repress, MD  albuterol (VENTOLIN HFA) 108 (90 Base) MCG/ACT inhaler INHALE 1 - 2 PUFFS INTO THE LUNGS EVERY 6 HOURS AS NEEDED FOR WHEEZING OR SHORTNESS OF BREATH 06/21/21     amitriptyline (ELAVIL) 25 MG tablet Take 1 tablet (25 mg total) by mouth nightly. 06/21/21     amoxicillin (AMOXIL) 500 MG capsule Take 1 capsule (500 mg total) by mouth three times a day with food until gone. 04/16/21     aspirin 81 MG tablet Take 81 mg by mouth daily.    [provider]  benzonatate (TESSALON) 100 MG capsule Take 1 capsule (100 mg total) by mouth every 8 (eight) hours for 7 days. 02/28/23     buPROPion (WELLBUTRIN XL) 150 MG 24 hr tablet Take 1 tablet (150 mg total) by mouth every morning.  01/28/23     cefdinir (OMNICEF) 300 MG capsule Take 1 capsule (300 mg total) by mouth 2 (two) times daily. 07/04/22   Peter Garter, PA  cetirizine (ZYRTEC) 10 MG tablet TAKE 1 TABLET (10 MG TOTAL) BY MOUTH DAILY. 01/03/20 01/02/21  Jeannie Fend, PA-C  citalopram (CELEXA) 40 MG tablet TAKE 1 TABLET (40 MG TOTAL) BY MOUTH DAILY. 05/02/20 05/02/21  Kerin Salen, PA-C  citalopram (CELEXA) 40 MG tablet Take 1 tablet (40 mg total) by mouth daily. **NO MORE REFILLS - NEED AN OFFICE VISIT.** 03/25/23     cyclobenzaprine (FLEXERIL) 10 MG tablet Take 1 tablet (10 mg total) by mouth 3 (three) times daily as needed for msucle spasms for up to 7 days. 07/28/22     estradiol (ESTRACE) 0.1 MG/GM vaginal cream Apply 1 gram per vagina every night for 2 weeks, then apply three times a week 01/21/22   Lorriane Shire, MD  fluconazole (DIFLUCAN) 150 MG tablet Take 1 tablet (150 mg total) by mouth daily. 12/17/22   Dameron, Nolberto Hanlon, DO  fluticasone (FLONASE) 50 MCG/ACT nasal spray PLACE 1 SPRAY INTO BOTH NOSTRILS DAILY. 01/03/20 01/02/21  Jeannie Fend, PA-C  gabapentin (NEURONTIN) 300 MG capsule Take 1 capsule (300 mg total) by mouth Nightly.  03/24/23     gatifloxacin (ZYMAXID) 0.5 % SOLN Place 1 drop into the left eye four times daily as directed Patient not taking: Reported on 01/16/2022 06/05/21   Mia Creek, MD  hydrochlorothiazide (HYDRODIURIL) 25 MG tablet Take by mouth. 05/27/18   [provider]  hydrochlorothiazide (HYDRODIURIL) 25 MG tablet TAKE 1 TABLET (25 MG TOTAL) BY MOUTH DAILY. 05/02/20 05/02/21  Kerin Salen, PA-C  hydrochlorothiazide (HYDRODIURIL) 25 MG tablet Take 1 tablet (25 mg total) by mouth daily. **NO MORE REFILLS - NEED AN OFFICE VISIT.** 03/25/23     ketorolac (ACULAR) 0.5 % ophthalmic solution Place 1 drop into the left eye four times daily as directed Patient not taking: Reported on 01/16/2022 06/05/21   Mia Creek, MD  lidocaine (LIDODERM) 5 % Place  1 patch onto the skin daily. Remove and discard patch within 12 hours or as directed ny MD. 07/28/22     LORazepam (ATIVAN) 2 MG tablet Take 1 tablet by mouth 1 hour pre-op with a small sip of water 10/19/20     meloxicam (MOBIC) 15 MG tablet Take 1 tablet (15 mg total) by mouth daily. 04/20/23   Elson Areas, PA-C  methylPREDNISolone (MEDROL DOSEPAK) 4 MG TBPK tablet Take As Directed On Package 08/08/22     metoprolol succinate (TOPROL-XL) 100 MG 24 hr tablet TAKE 1 TABLET (100 MG TOTAL) BY MOUTH EVERY MORNING. 05/02/20 05/02/21  Kerin Salen, PA-C  metoprolol succinate (TOPROL-XL) 100 MG 24 hr tablet Take 1 tablet (100 mg total) by mouth every morning. **NO MORE REFILLS-NEEDS AN OFFICE VISIT** Patient not taking: Reported on 01/16/2022 05/21/21     metoprolol succinate (TOPROL-XL) 100 MG 24 hr tablet Take 1 tablet (100 mg total) by mouth every morning. 07/18/22     metoprolol succinate (TOPROL-XL) 25 MG 24 hr tablet TAKE 1 TABLET BY MOUTH DAILY WITH 100MG  TABLET **NEEDS AN OFFICE VISIT** 02/09/20 01/16/22  Kerin Salen, PA-C  metoprolol succinate (TOPROL-XL) 25 MG 24 hr tablet TAKE 1 TABLET (25 MG TOTAL) BY MOUTH DAILY WITH 100 MG TABLET. 06/21/21     metoprolol succinate (TOPROL-XL) 50 MG 24 hr tablet Take 1 tablet (50 mg total) by mouth at bedtime. Continue metoprolol ER 100 mg every morning. 05/01/22     metoprolol tartrate (LOPRESSOR) 100 MG tablet Take 100 mg by mouth 2 (two) times daily.    [provider]  naproxen (NAPROSYN) 500 MG tablet Take 1 tablet (500 mg total) by mouth 2 (two) times daily with a meal. 07/28/22     nystatin cream (MYCOSTATIN) Apply to affected area 2 times daily 12/17/22   Dameron, Nolberto Hanlon, DO  ondansetron (ZOFRAN-ODT) 4 MG disintegrating tablet Place 1 tablet on the top of tongue where they will dissolve, then swallow every 8 hours as needed for nausea/vomiting 10/19/20     pantoprazole (PROTONIX) 20 MG tablet TAKE 1 TABLET (20 MG TOTAL) BY MOUTH  DAILY. 05/02/20 05/02/21  Kerin Salen, PA-C  pantoprazole (PROTONIX) 20 MG tablet TAKE 1 TABLET (20 MG TOTAL) BY MOUTH DAILY. **NEEDS AN OFFICE VISIT** 02/04/20 02/03/21  Kerin Salen, PA-C  pantoprazole (PROTONIX) 20 MG tablet Take 1 tablet (20 mg total) by mouth every morning before breakfast. **NO MORE REFILLS - NEED AN OFFICE VISIT.** 03/25/23     potassium chloride SA (KLOR-CON M) 20 MEQ tablet TAKE 1 TABLET (20 MEQ TOTAL) BY MOUTH DAILY. 05/02/20 05/27/21  Kerin Salen, PA-C  potassium chloride SA (KLOR-CON M) 20 MEQ tablet Take  1 tablet (20 mEq total) by mouth daily. **NEEDS AN OFFICE VISIT-NO MORE REFILLS** 12/06/22     prednisoLONE acetate (PRED FORTE) 1 % ophthalmic suspension Place 1 drop into the left eye four times daily as directed. **Shake well before each use** Patient not taking: Reported on 01/16/2022 06/05/21   Mia Creek, MD  predniSONE (DELTASONE) 50 MG tablet Take 1 tablet (50 mg total) by mouth daily with breakfast. 06/02/22   Chestine Spore, Meghan R, PA-C  simvastatin (ZOCOR) 40 MG tablet TAKE 1 TABLET (40 MG TOTAL) BY MOUTH DAILY. 05/02/20 05/02/21  Kerin Salen, PA-C  simvastatin (ZOCOR) 40 MG tablet TAKE 1 TABLET BY MOUTH ONCE DAILY 05/24/19 05/23/20  Kerin Salen, PA-C  simvastatin (ZOCOR) 40 MG tablet Take 1 tablet (40 mg total) by mouth at bedtime. **NEED AN OFFICE VISIT** 01/16/23     valACYclovir (VALTREX) 500 MG tablet Take 1 tablet (500 mg total) by mouth daily. 07/25/22         Allergies    Codeine, Nitrofurantoin, Sulfamethoxazole-trimethoprim, and Septra [sulfamethoxazole w/trimethoprim (co-trimoxazole)]    Review of Systems   Review of Systems  All other systems reviewed and are negative.   Physical Exam Updated Vital Signs BP 114/77 (BP Location: Right Arm)   Pulse 71   Temp 99.1 F (37.3 C) (Oral)   Resp 18   SpO2 97%  Physical Exam Vitals and nursing note reviewed.  Constitutional:      Appearance:  She is well-developed.  HENT:     Head: Normocephalic.     Right Ear: Tympanic membrane normal.     Left Ear: Tympanic membrane normal.     Nose: Nose normal.     Mouth/Throat:     Mouth: Mucous membranes are moist.  Cardiovascular:     Rate and Rhythm: Normal rate.  Pulmonary:     Effort: Pulmonary effort is normal.  Abdominal:     General: There is no distension.  Musculoskeletal:        General: Swelling and tenderness present.     Cervical back: Normal range of motion.     Comments: Swollen tender left knee pain with range of motion,  Neurological:     Mental Status: She is alert and oriented to person, place, and time.  Psychiatric:        Mood and Affect: Mood normal.     ED Results / Procedures / Treatments   Labs (all labs ordered are listed, but only abnormal results are displayed) Labs Reviewed  RESP PANEL BY RT-PCR (RSV, FLU A&B, COVID)  [id number] - Abnormal; Notable for the following components:      Result Value   Resp Syncytial Virus by PCR POSITIVE (*)    All other components within normal limits    EKG None  Radiology DG Knee Complete 4 Views Left Result Date: 04/20/2023 CLINICAL DATA:  58 year old female with knee pain and swelling for 2 weeks. EXAM: LEFT KNEE - COMPLETE 4+ VIEW COMPARISON:  06/02/2022 left knee series. FINDINGS: Medial compartment joint space loss and moderate degenerative spurring have not significantly changed from last year. Mild to moderate patellofemoral and lateral compartment degenerative spurring also stable. On the lateral view there is a new or increased suprapatellar joint effusion which appears small to moderate. Patella intact. No acute osseous abnormality identified. IMPRESSION: 1. Small to moderate suprapatellar joint effusion is new or increased from last year. 2. 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 No acute osseous  abnormality identified. Electronically Signed   By:  Odessa Fleming M.D.   On: 04/20/2023 09:52    Procedures Procedures    Medications Ordered in ED Medications - No data to display  ED Course/ Medical Decision Making/ A&P                                 Medical Decision Making Patient complains of pain and swelling in her left knee.  Patient also has a cough congestion and right ear pain  Amount and/or Complexity of Data Reviewed Labs: ordered. Decision-making details documented in ED Course.    Details: Labs ordered reviewed and interpreted RSV is negative Radiology: ordered and independent interpretation performed. Decision-making details documented in ED Course.    Details: X-ray shows degenerative changes left knee  Risk Prescription drug management. Risk Details: Patient advised to follow-up with her orthopedist for further evaluation.  Patient counseled on symptomatic treatment of RSV.           Final Clinical Impression(s) / ED Diagnoses Final diagnoses:  Primary osteoarthritis of left knee  RSV (respiratory syncytial virus infection)    Rx / DC Orders ED Discharge Orders          Ordered    meloxicam (MOBIC) 15 MG tablet  Daily        04/20/23 1034           An After Visit Summary was printed and given to the patient.    Elson Areas, [place] 04/20/23 1044    Terald Sleeper, MD 04/20/23 [phone]

## 2023-04-21 ENCOUNTER — Encounter (HOSPITAL_BASED_OUTPATIENT_CLINIC_OR_DEPARTMENT_OTHER): Payer: Self-pay

## 2023-04-21 ENCOUNTER — Other Ambulatory Visit (HOSPITAL_BASED_OUTPATIENT_CLINIC_OR_DEPARTMENT_OTHER): Payer: Self-pay

## 2023-04-22 ENCOUNTER — Other Ambulatory Visit (HOSPITAL_BASED_OUTPATIENT_CLINIC_OR_DEPARTMENT_OTHER): Payer: Self-pay

## 2023-04-24 ENCOUNTER — Other Ambulatory Visit (HOSPITAL_BASED_OUTPATIENT_CLINIC_OR_DEPARTMENT_OTHER): Payer: Self-pay

## 2023-05-05 ENCOUNTER — Other Ambulatory Visit (HOSPITAL_BASED_OUTPATIENT_CLINIC_OR_DEPARTMENT_OTHER): Payer: Self-pay

## 2023-05-05 MED ORDER — METOPROLOL SUCCINATE ER 50 MG PO TB24
50.0000 mg | ORAL_TABLET | Freq: Every day | ORAL | 3 refills | Status: AC
  Filled 2023-05-05: qty 90, 90d supply, fill #0
  Filled 2023-08-04 – 2023-08-19 (×5): qty 90, 90d supply, fill #1

## 2023-05-06 ENCOUNTER — Other Ambulatory Visit (HOSPITAL_BASED_OUTPATIENT_CLINIC_OR_DEPARTMENT_OTHER): Payer: Self-pay

## 2023-05-06 ENCOUNTER — Other Ambulatory Visit: Payer: Self-pay

## 2023-05-13 ENCOUNTER — Other Ambulatory Visit: Payer: Self-pay

## 2023-05-13 ENCOUNTER — Other Ambulatory Visit (HOSPITAL_BASED_OUTPATIENT_CLINIC_OR_DEPARTMENT_OTHER): Payer: Self-pay

## 2023-05-13 MED ORDER — POTASSIUM CHLORIDE CRYS ER 20 MEQ PO TBCR
20.0000 meq | EXTENDED_RELEASE_TABLET | Freq: Every day | ORAL | 0 refills | Status: AC
  Filled 2023-05-13: qty 90, 90d supply, fill #0

## 2023-05-16 ENCOUNTER — Other Ambulatory Visit: Payer: Self-pay

## 2023-05-16 ENCOUNTER — Other Ambulatory Visit (HOSPITAL_BASED_OUTPATIENT_CLINIC_OR_DEPARTMENT_OTHER): Payer: Self-pay

## 2023-05-16 MED ORDER — METOPROLOL SUCCINATE ER 50 MG PO TB24
50.0000 mg | ORAL_TABLET | Freq: Every evening | ORAL | 2 refills | Status: DC
  Filled 2023-05-19 – 2023-07-15 (×6): qty 90, 90d supply, fill #0
  Filled 2023-09-05 – 2023-10-15 (×2): qty 90, 90d supply, fill #1
  Filled 2024-01-14: qty 90, 90d supply, fill #2

## 2023-05-16 MED ORDER — ALBUTEROL SULFATE HFA 108 (90 BASE) MCG/ACT IN AERS
1.0000 | INHALATION_SPRAY | Freq: Four times a day (QID) | RESPIRATORY_TRACT | 6 refills | Status: AC | PRN
  Filled 2023-05-16: qty 6.7, 25d supply, fill #0
  Filled 2023-10-29: qty 6.7, 25d supply, fill #1

## 2023-05-16 MED ORDER — VALACYCLOVIR HCL 500 MG PO TABS
500.0000 mg | ORAL_TABLET | Freq: Every day | ORAL | 2 refills | Status: AC
  Filled 2023-07-29: qty 90, 90d supply, fill #0
  Filled 2023-10-22: qty 90, 90d supply, fill #1
  Filled 2024-01-21: qty 90, 90d supply, fill #2

## 2023-05-16 MED ORDER — BUPROPION HCL ER (XL) 150 MG PO TB24
150.0000 mg | ORAL_TABLET | Freq: Every morning | ORAL | 2 refills | Status: DC
  Filled 2023-05-16: qty 90, 90d supply, fill #0
  Filled 2023-08-07: qty 90, 90d supply, fill #1
  Filled 2023-10-22: qty 90, 90d supply, fill #2

## 2023-05-16 MED ORDER — SIMVASTATIN 40 MG PO TABS
40.0000 mg | ORAL_TABLET | Freq: Every day | ORAL | 2 refills | Status: DC
  Filled 2023-05-16: qty 90, 90d supply, fill #0
  Filled 2023-08-07: qty 90, 90d supply, fill #1
  Filled 2023-10-22: qty 90, 90d supply, fill #2

## 2023-05-16 MED ORDER — CITALOPRAM HYDROBROMIDE 40 MG PO TABS
40.0000 mg | ORAL_TABLET | Freq: Every day | ORAL | 2 refills | Status: DC
  Filled 2023-05-16: qty 90, 90d supply, fill #0
  Filled 2023-08-07: qty 90, 90d supply, fill #1
  Filled 2023-10-22: qty 90, 90d supply, fill #2

## 2023-05-16 MED ORDER — METOPROLOL SUCCINATE ER 100 MG PO TB24
100.0000 mg | ORAL_TABLET | Freq: Every morning | ORAL | 2 refills | Status: AC
  Filled 2023-05-16 – 2023-08-07 (×2): qty 90, 90d supply, fill #0
  Filled 2023-10-29: qty 90, 90d supply, fill #1
  Filled 2024-02-02: qty 90, 90d supply, fill #2

## 2023-05-16 MED ORDER — PANTOPRAZOLE SODIUM 20 MG PO TBEC
20.0000 mg | DELAYED_RELEASE_TABLET | Freq: Every morning | ORAL | 2 refills | Status: DC
  Filled 2023-05-16: qty 90, 90d supply, fill #0
  Filled 2023-08-07: qty 90, 90d supply, fill #1
  Filled 2023-10-22: qty 90, 90d supply, fill #2

## 2023-05-19 ENCOUNTER — Other Ambulatory Visit (HOSPITAL_BASED_OUTPATIENT_CLINIC_OR_DEPARTMENT_OTHER): Payer: Self-pay

## 2023-05-20 ENCOUNTER — Other Ambulatory Visit (HOSPITAL_BASED_OUTPATIENT_CLINIC_OR_DEPARTMENT_OTHER): Payer: Self-pay

## 2023-05-21 ENCOUNTER — Other Ambulatory Visit (HOSPITAL_BASED_OUTPATIENT_CLINIC_OR_DEPARTMENT_OTHER): Payer: Self-pay

## 2023-05-22 ENCOUNTER — Other Ambulatory Visit (HOSPITAL_BASED_OUTPATIENT_CLINIC_OR_DEPARTMENT_OTHER): Payer: Self-pay

## 2023-05-23 ENCOUNTER — Other Ambulatory Visit (HOSPITAL_BASED_OUTPATIENT_CLINIC_OR_DEPARTMENT_OTHER): Payer: Self-pay

## 2023-06-04 ENCOUNTER — Other Ambulatory Visit (HOSPITAL_BASED_OUTPATIENT_CLINIC_OR_DEPARTMENT_OTHER): Payer: Self-pay

## 2023-06-11 ENCOUNTER — Other Ambulatory Visit (HOSPITAL_BASED_OUTPATIENT_CLINIC_OR_DEPARTMENT_OTHER): Payer: Self-pay

## 2023-06-11 MED ORDER — DOXYCYCLINE HYCLATE 100 MG PO CAPS
100.0000 mg | ORAL_CAPSULE | Freq: Two times a day (BID) | ORAL | 0 refills | Status: AC
  Filled 2023-06-11: qty 20, 10d supply, fill #0

## 2023-06-11 MED ORDER — CELECOXIB 100 MG PO CAPS
100.0000 mg | ORAL_CAPSULE | Freq: Every day | ORAL | 3 refills | Status: AC
  Filled 2023-06-11: qty 30, 30d supply, fill #0
  Filled 2023-07-07: qty 30, 30d supply, fill #1
  Filled 2023-08-05: qty 30, 30d supply, fill #2
  Filled 2023-09-05: qty 30, 30d supply, fill #3

## 2023-06-30 ENCOUNTER — Other Ambulatory Visit: Payer: Self-pay

## 2023-06-30 ENCOUNTER — Other Ambulatory Visit (HOSPITAL_BASED_OUTPATIENT_CLINIC_OR_DEPARTMENT_OTHER): Payer: Self-pay

## 2023-07-07 ENCOUNTER — Other Ambulatory Visit: Payer: Self-pay

## 2023-07-07 ENCOUNTER — Other Ambulatory Visit (HOSPITAL_BASED_OUTPATIENT_CLINIC_OR_DEPARTMENT_OTHER): Payer: Self-pay

## 2023-07-07 MED ORDER — GABAPENTIN 300 MG PO CAPS
300.0000 mg | ORAL_CAPSULE | Freq: Every day | ORAL | 2 refills | Status: DC
  Filled 2023-07-07: qty 30, 30d supply, fill #0
  Filled 2023-08-19 – 2023-09-05 (×2): qty 30, 30d supply, fill #1
  Filled 2023-10-29: qty 30, 30d supply, fill #2

## 2023-07-15 ENCOUNTER — Other Ambulatory Visit (HOSPITAL_BASED_OUTPATIENT_CLINIC_OR_DEPARTMENT_OTHER): Payer: Self-pay

## 2023-07-16 ENCOUNTER — Other Ambulatory Visit (HOSPITAL_BASED_OUTPATIENT_CLINIC_OR_DEPARTMENT_OTHER): Payer: Self-pay

## 2023-07-16 MED ORDER — METHYLPREDNISOLONE 4 MG PO TBPK
ORAL_TABLET | ORAL | 0 refills | Status: AC
  Filled 2023-07-16: qty 21, 6d supply, fill #0

## 2023-07-21 ENCOUNTER — Other Ambulatory Visit (HOSPITAL_BASED_OUTPATIENT_CLINIC_OR_DEPARTMENT_OTHER): Payer: Self-pay

## 2023-07-29 ENCOUNTER — Other Ambulatory Visit (HOSPITAL_BASED_OUTPATIENT_CLINIC_OR_DEPARTMENT_OTHER): Payer: Self-pay

## 2023-08-04 ENCOUNTER — Other Ambulatory Visit (HOSPITAL_BASED_OUTPATIENT_CLINIC_OR_DEPARTMENT_OTHER): Payer: Self-pay

## 2023-08-05 ENCOUNTER — Other Ambulatory Visit: Payer: Self-pay

## 2023-08-05 ENCOUNTER — Other Ambulatory Visit (HOSPITAL_BASED_OUTPATIENT_CLINIC_OR_DEPARTMENT_OTHER): Payer: Self-pay

## 2023-08-06 ENCOUNTER — Other Ambulatory Visit (HOSPITAL_BASED_OUTPATIENT_CLINIC_OR_DEPARTMENT_OTHER): Payer: Self-pay

## 2023-08-07 ENCOUNTER — Other Ambulatory Visit (HOSPITAL_BASED_OUTPATIENT_CLINIC_OR_DEPARTMENT_OTHER): Payer: Self-pay

## 2023-08-07 ENCOUNTER — Other Ambulatory Visit: Payer: Self-pay

## 2023-08-11 ENCOUNTER — Other Ambulatory Visit (HOSPITAL_BASED_OUTPATIENT_CLINIC_OR_DEPARTMENT_OTHER): Payer: Self-pay

## 2023-08-20 ENCOUNTER — Other Ambulatory Visit (HOSPITAL_BASED_OUTPATIENT_CLINIC_OR_DEPARTMENT_OTHER): Payer: Self-pay

## 2023-08-20 ENCOUNTER — Other Ambulatory Visit: Payer: Self-pay

## 2023-09-01 ENCOUNTER — Other Ambulatory Visit (HOSPITAL_BASED_OUTPATIENT_CLINIC_OR_DEPARTMENT_OTHER): Payer: Self-pay

## 2023-09-05 ENCOUNTER — Other Ambulatory Visit (HOSPITAL_BASED_OUTPATIENT_CLINIC_OR_DEPARTMENT_OTHER): Payer: Self-pay

## 2023-10-13 ENCOUNTER — Other Ambulatory Visit: Payer: Self-pay

## 2023-10-13 ENCOUNTER — Encounter (HOSPITAL_BASED_OUTPATIENT_CLINIC_OR_DEPARTMENT_OTHER): Payer: Self-pay

## 2023-10-13 ENCOUNTER — Emergency Department (HOSPITAL_BASED_OUTPATIENT_CLINIC_OR_DEPARTMENT_OTHER)
Admission: EM | Admit: 2023-10-13 | Discharge: 2023-10-13 | Disposition: A | Discharge status: Home / Self Care | Source: Ambulatory Visit | Attending: Emergency Medicine | Admitting: Emergency Medicine

## 2023-10-13 ENCOUNTER — Other Ambulatory Visit (HOSPITAL_BASED_OUTPATIENT_CLINIC_OR_DEPARTMENT_OTHER): Payer: Self-pay

## 2023-10-13 DIAGNOSIS — Z7982 Long term (current) use of aspirin: Secondary | ICD-10-CM | POA: Diagnosis not present

## 2023-10-13 DIAGNOSIS — J0110 Acute frontal sinusitis, unspecified: Secondary | ICD-10-CM | POA: Diagnosis not present

## 2023-10-13 DIAGNOSIS — R059 Cough, unspecified: Secondary | ICD-10-CM | POA: Diagnosis present

## 2023-10-13 LAB — RESP PANEL BY RT-PCR (RSV, FLU A&B, COVID)  RVPGX2
Influenza A by PCR: NEGATIVE
Influenza B by PCR: NEGATIVE
Resp Syncytial Virus by PCR: NEGATIVE
SARS Coronavirus 2 by RT PCR: POSITIVE — AB

## 2023-10-13 MED ORDER — AMOXICILLIN-POT CLAVULANATE 875-125 MG PO TABS
1.0000 | ORAL_TABLET | Freq: Two times a day (BID) | ORAL | 0 refills | Status: AC
  Filled 2023-10-13: qty 14, 7d supply, fill #0

## 2023-10-13 MED ORDER — BENZONATATE 100 MG PO CAPS
100.0000 mg | ORAL_CAPSULE | Freq: Three times a day (TID) | ORAL | 0 refills | Status: AC
  Filled 2023-10-13: qty 21, 7d supply, fill #0

## 2023-10-13 MED ORDER — ONDANSETRON 4 MG PO TBDP
4.0000 mg | ORAL_TABLET | ORAL | 0 refills | Status: AC | PRN
  Filled 2023-10-13: qty 20, 4d supply, fill #0

## 2023-10-13 NOTE — Discharge Instructions (Signed)
 Take tylenol 2 pills 4 times a day and motrin 4 pills 3 times a day.  Drink plenty of fluids.  Return for worsening shortness of breath, headache, confusion. Follow up with your family doctor.

## 2023-10-13 NOTE — ED Triage Notes (Signed)
 Reports headache, congestion, chest congestion, non productive cough, fatigue since yesterday   Denies chest pain or [facility]

## 2023-10-13 NOTE — ED Provider Notes (Signed)
 [place] [facility] Provider Note   CSN: [id number] Arrival date & time: 10/13/23  0848     Patient presents with: Headache   Katherine Wiggins is a 58 y.o. female.   58 yo F with a cc of cough congestion headache sore throat going on for a few days.  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  But does not know any obvious sick contacts.   Headache      Prior to Admission medications   Medication Sig Start Date End Date Taking? Authorizing Provider  amoxicillin -clavulanate (AUGMENTIN ) 875-125 MG tablet Take 1 tablet by mouth every 12 (twelve) hours. 10/13/23  Yes Emil Share, DO  benzonatate  (TESSALON ) 100 MG capsule Take 1 capsule (100 mg total) by mouth every 8 (eight) hours. 10/13/23  Yes Emil Share, DO  ondansetron  (ZOFRAN -ODT) 4 MG disintegrating tablet Take 1 tablet (4 mg total) by mouth every 4 (four) hours as needed nausea/vomiting. 10/13/23  Yes Emil Share, DO  albuterol  (VENTOLIN  HFA) 108 (90 Base) MCG/ACT inhaler INHALE 1 - 2 PUFFS INTO THE LUNGS EVERY 6 HOURS AS NEEDED FOR WHEEZING OR SHORTNESS OF BREATH 04/03/20 04/03/21  Long, Fonda MATSU, MD  albuterol  (VENTOLIN  HFA) 108 (90 Base) MCG/ACT inhaler INHALE 1 - 2 PUFFS INTO THE LUNGS EVERY 6 HOURS AS NEEDED FOR WHEEZING OR SHORTNESS OF BREATH 06/21/21     albuterol  (VENTOLIN  HFA) 108 (90 Base) MCG/ACT inhaler Inhale 1-2 puffs into the lungs every 6 (six) hours as needed for wheezing or shortness of breath 05/16/23     amitriptyline  (ELAVIL ) 25 MG tablet Take 1 tablet (25 mg total) by mouth nightly. 06/21/21     amoxicillin  (AMOXIL ) 500 MG capsule Take 1 capsule (500 mg total) by mouth three times a day with food until gone. 04/16/21     aspirin 81 MG tablet Take 81 mg by mouth daily.    [provider]  buPROPion  (WELLBUTRIN  XL) 150 MG 24 hr tablet Take 1 tablet (150 mg total) by mouth every morning. 01/28/23     buPROPion  (WELLBUTRIN  XL) 150 MG 24 hr tablet Take 1 tablet (150 mg total)  by mouth in the morning. 05/16/23     cefdinir  (OMNICEF ) 300 MG capsule Take 1 capsule (300 mg total) by mouth 2 (two) times daily. 07/04/22   Silver Wonda LABOR, PA  celecoxib  (CELEBREX ) 100 MG capsule Take 1 capsule (100 mg total) by mouth once daily for pelvic pain. 06/11/23     cetirizine  (ZYRTEC ) 10 MG tablet TAKE 1 TABLET (10 MG TOTAL) BY MOUTH DAILY. 01/03/20 01/02/21  Beverley Doffing A, PA-C  citalopram  (CELEXA ) 40 MG tablet TAKE 1 TABLET (40 MG TOTAL) BY MOUTH DAILY. 05/02/20 05/02/21  Evangelina Tinnie Norris, PA-C  citalopram  (CELEXA ) 40 MG tablet Take 1 tablet (40 mg total) by mouth daily. 05/16/23     cyclobenzaprine  (FLEXERIL ) 10 MG tablet Take 1 tablet (10 mg total) by mouth 3 (three) times daily as needed for msucle spasms for up to 7 days. 07/28/22     doxycycline  (VIBRAMYCIN ) 100 MG capsule Take 1 capsule (100 mg total) by mouth 2 (two) times daily for 10 days. 06/11/23     estradiol  (ESTRACE ) 0.1 MG/GM vaginal cream Apply 1 gram per vagina every night for 2 weeks, then apply three times a week 01/21/22   Ajewole, Christana, MD  fluconazole  (DIFLUCAN ) 150 MG tablet Take 1 tablet (150 mg total) by mouth daily. 12/17/22   Dameron, Marisa, DO  fluticasone  (  FLONASE ) 50 MCG/ACT nasal spray PLACE 1 SPRAY INTO BOTH NOSTRILS DAILY. 01/03/20 01/02/21  Beverley Leita LABOR, PA-C  gabapentin  (NEURONTIN ) 300 MG capsule Take 1 capsule (300 mg total) by mouth at bedtime. 07/07/23     gatifloxacin  (ZYMAXID ) 0.5 % SOLN Place 1 drop into the left eye four times daily as directed Patient not taking: Reported on 01/16/2022 06/05/21   Lavonia Lye, MD  hydrochlorothiazide  (HYDRODIURIL ) 25 MG tablet Take by mouth. 05/27/18   [provider]  hydrochlorothiazide  (HYDRODIURIL ) 25 MG tablet TAKE 1 TABLET (25 MG TOTAL) BY MOUTH DAILY. 05/02/20 05/02/21  Evangelina Tinnie Norris, PA-C  ketorolac  (ACULAR ) 0.5 % ophthalmic solution Place 1 drop into the left eye four times daily as directed Patient not taking: Reported on  01/16/2022 06/05/21   Lavonia Lye, MD  lidocaine  (LIDODERM ) 5 % Place 1 patch onto the skin daily. Remove and discard patch within 12 hours or as directed ny MD. 07/28/22     LORazepam  (ATIVAN ) 2 MG tablet Take 1 tablet by mouth 1 hour pre-op with a small sip of water 10/19/20     meloxicam  (MOBIC ) 15 MG tablet Take 1 tablet (15 mg total) by mouth daily. 04/20/23   Flint Sonny POUR, PA-C  methylPREDNISolone  (MEDROL  DOSEPAK) 4 MG TBPK tablet Take As Directed On Package 08/08/22     metoprolol  succinate (TOPROL -XL) 100 MG 24 hr tablet TAKE 1 TABLET (100 MG TOTAL) BY MOUTH EVERY MORNING. 05/02/20 05/02/21  Evangelina Tinnie Norris, PA-C  metoprolol  succinate (TOPROL -XL) 100 MG 24 hr tablet Take 1 tablet (100 mg total) by mouth every morning. **NO MORE REFILLS-NEEDS AN OFFICE VISIT** Patient not taking: Reported on 01/16/2022 05/21/21     metoprolol  succinate (TOPROL -XL) 100 MG 24 hr tablet Take 1 tablet (100 mg total) by mouth every morning. 07/18/22     metoprolol  succinate (TOPROL -XL) 100 MG 24 hr tablet Take 1 tablet (100 mg total) by mouth every morning. 05/16/23     metoprolol  succinate (TOPROL -XL) 25 MG 24 hr tablet TAKE 1 TABLET BY MOUTH DAILY WITH 100MG  TABLET **NEEDS AN OFFICE VISIT** 02/09/20 01/16/22  Evangelina Tinnie Norris, PA-C  metoprolol  succinate (TOPROL -XL) 25 MG 24 hr tablet TAKE 1 TABLET (25 MG TOTAL) BY MOUTH DAILY WITH 100 MG TABLET. 06/21/21     metoprolol  succinate (TOPROL -XL) 50 MG 24 hr tablet Take 1 tablet (50 mg total) by mouth at bedtime. Continue metoprolol  ER 100 mg every morning. 05/05/23     metoprolol  succinate (TOPROL -XL) 50 MG 24 hr tablet Take 1 tablet (50 mg total) by mouth at bedtime. Continue metoprolol  ER 100 mg every morning. 05/16/23     metoprolol  tartrate (LOPRESSOR ) 100 MG tablet Take 100 mg by mouth 2 (two) times daily.    [provider]  naproxen  (NAPROSYN ) 500 MG tablet Take 1 tablet (500 mg total) by mouth 2 (two) times daily with a meal. 07/28/22      nystatin  cream (MYCOSTATIN ) Apply to affected area 2 times daily 12/17/22   Dameron, Marisa, DO  pantoprazole  (PROTONIX ) 20 MG tablet TAKE 1 TABLET (20 MG TOTAL) BY MOUTH DAILY. 05/02/20 05/02/21  Evangelina Tinnie Norris, PA-C  pantoprazole  (PROTONIX ) 20 MG tablet TAKE 1 TABLET (20 MG TOTAL) BY MOUTH DAILY. **NEEDS AN OFFICE VISIT** 02/04/20 02/03/21  Evangelina Tinnie Norris, PA-C  pantoprazole  (PROTONIX ) 20 MG tablet Take 1 tablet (20 mg total) by mouth every morning before breakfast. 05/16/23     potassium chloride  SA (KLOR-CON  M) 20 MEQ tablet TAKE 1 TABLET (20 MEQ  TOTAL) BY MOUTH DAILY. 05/02/20 05/27/21  Evangelina Tinnie Norris, PA-C  potassium chloride  SA (KLOR-CON  M) 20 MEQ tablet Take 1 tablet (20 mEq total) by mouth daily. **KEEP UPCOMING APPT.** 05/13/23     prednisoLONE  acetate (PRED FORTE ) 1 % ophthalmic suspension Place 1 drop into the left eye four times daily as directed. **Shake well before each use** Patient not taking: Reported on 01/16/2022 06/05/21   Lavonia Lye, MD  predniSONE  (DELTASONE ) 50 MG tablet Take 1 tablet (50 mg total) by mouth daily with breakfast. 06/02/22   Clark, Meghan R, PA-C  simvastatin  (ZOCOR ) 40 MG tablet TAKE 1 TABLET (40 MG TOTAL) BY MOUTH DAILY. 05/02/20 05/02/21  Evangelina Tinnie Norris, PA-C  simvastatin  (ZOCOR ) 40 MG tablet TAKE 1 TABLET BY MOUTH ONCE DAILY 05/24/19 05/23/20  Evangelina Tinnie Norris, PA-C  simvastatin  (ZOCOR ) 40 MG tablet Take 1 tablet (40 mg total) by mouth at bedtime. **NEED AN OFFICE VISIT** 01/16/23     simvastatin  (ZOCOR ) 40 MG tablet Take 1 tablet (40 mg total) by mouth at bedtime. 05/16/23     valACYclovir  (VALTREX ) 500 MG tablet Take 1 tablet (500 mg total) by mouth daily. 07/25/22     valACYclovir  (VALTREX ) 500 MG tablet Take 1 tablet (500 mg total) by mouth daily. 05/16/23       Allergies: Codeine, Nitrofurantoin, Sulfamethoxazole-trimethoprim, and Septra [sulfamethoxazole w/trimethoprim (co-trimoxazole)]    Review of Systems   Neurological:  Positive for headaches.    Updated Vital Signs BP 139/77   Pulse 89   Temp 99.4 F (37.4 C) (Oral)   Resp 18   Wt 95.3 kg   LMP 07/08/2023 (Approximate)   SpO2 99%   BMI 37.20 kg/m   Physical Exam Vitals and nursing note reviewed.  Constitutional:      General: She is not in acute distress.    Appearance: She is well-developed. She is not diaphoretic.  HENT:     Head: Normocephalic and atraumatic.     Mouth/Throat:     Comments: Swollen turbinates, posterior nasal drip, right frontal sinuses exquisitely tender to percussion, right TM with purulent effusion bulging and distortion of landmarks  Eyes:     Pupils: Pupils are equal, round, and reactive to light.  Cardiovascular:     Rate and Rhythm: Normal rate and regular rhythm.     Heart sounds: No murmur heard.    No friction rub. No gallop.  Pulmonary:     Effort: Pulmonary effort is normal.     Breath sounds: No wheezing or rales.  Abdominal:     General: There is no distension.     Palpations: Abdomen is soft.     Tenderness: There is no abdominal tenderness.  Musculoskeletal:        General: No tenderness.     Cervical back: Normal range of motion and neck supple.  Skin:    General: Skin is warm and dry.  Neurological:     Mental Status: She is alert and oriented to person, place, and time.  Psychiatric:        Behavior: Behavior normal.     (all labs ordered are listed, but only abnormal results are displayed) Labs Reviewed  RESP PANEL BY RT-PCR (RSV, FLU A&B, COVID)  [id number] - Abnormal; Notable for the following components:      Result Value   SARS Coronavirus 2 by RT PCR POSITIVE (*)    All other components within normal limits    EKG: None  Radiology: No results found.  Procedures   Medications Ordered in the ED - No data to display                                  Medical Decision Making Risk Prescription drug management.   58 yo F with a chief complaint of cough  congestion sore throat headache.  Patient clinically with viral syndrome.  She also has signs of otitis media and perhaps sinusitis.  Will start on oral antibiotics.  PCP follow-up.  11:45 AM:  I have discussed the diagnosis/risks/treatment options with the patient.  Evaluation and diagnostic testing in the emergency department does not suggest an emergent condition requiring admission or immediate intervention beyond what has been performed at this time.  They will follow up with PCP. We also discussed returning to the ED immediately if new or worsening sx occur. We discussed the sx which are most concerning (e.g., sudden worsening pain, fever, inability to tolerate by mouth) that necessitate immediate return. Medications administered to the patient during their visit and any new prescriptions provided to the patient are listed below.  Medications given during this visit Medications - No data to display   The patient appears reasonably screen and/or stabilized for discharge and I doubt any other medical condition or other [facility] requiring further screening, evaluation, or treatment in the ED at this time prior to discharge.       Final diagnoses:  Acute frontal sinusitis, recurrence not specified    ED Discharge Orders          Ordered    amoxicillin -clavulanate (AUGMENTIN ) 875-125 MG tablet  Every 12 hours        10/13/23 0911    benzonatate  (TESSALON ) 100 MG capsule  Every 8 hours        10/13/23 0911    ondansetron  (ZOFRAN -ODT) 4 MG disintegrating tablet  Every 4 hours PRN        10/13/23 0911               Shalonda Sachse, DO 10/13/23 1145

## 2023-10-15 ENCOUNTER — Other Ambulatory Visit (HOSPITAL_BASED_OUTPATIENT_CLINIC_OR_DEPARTMENT_OTHER): Payer: Self-pay

## 2023-10-22 ENCOUNTER — Other Ambulatory Visit (HOSPITAL_BASED_OUTPATIENT_CLINIC_OR_DEPARTMENT_OTHER): Payer: Self-pay

## 2023-11-03 NOTE — Telephone Encounter (Signed)
 Called and unable to leave message due to voicemail being full. Upon return call, please reschedule 11/12/23 appt due to provider being out of office.

## 2023-11-10 NOTE — Telephone Encounter (Signed)
 Called and left message.  Upon return call, please reschedule 10/2023 appt since Dr.Moore will not be in office.

## 2023-11-11 ENCOUNTER — Other Ambulatory Visit (HOSPITAL_BASED_OUTPATIENT_CLINIC_OR_DEPARTMENT_OTHER): Payer: Self-pay

## 2023-11-11 MED ORDER — FLUCONAZOLE 150 MG PO TABS
150.0000 mg | ORAL_TABLET | ORAL | 0 refills | Status: AC
  Filled 2023-11-11: qty 4, 28d supply, fill #0

## 2023-11-14 ENCOUNTER — Other Ambulatory Visit (HOSPITAL_BASED_OUTPATIENT_CLINIC_OR_DEPARTMENT_OTHER): Payer: Self-pay

## 2023-11-14 MED ORDER — CLINDAMYCIN HCL 300 MG PO CAPS
300.0000 mg | ORAL_CAPSULE | Freq: Two times a day (BID) | ORAL | 0 refills | Status: AC
  Filled 2023-11-14: qty 14, 7d supply, fill #0

## 2023-11-24 ENCOUNTER — Other Ambulatory Visit (HOSPITAL_BASED_OUTPATIENT_CLINIC_OR_DEPARTMENT_OTHER): Payer: Self-pay

## 2023-11-26 ENCOUNTER — Other Ambulatory Visit (HOSPITAL_BASED_OUTPATIENT_CLINIC_OR_DEPARTMENT_OTHER): Payer: Self-pay

## 2023-11-26 MED ORDER — GABAPENTIN 300 MG PO CAPS
300.0000 mg | ORAL_CAPSULE | Freq: Every day | ORAL | 2 refills | Status: AC
  Filled 2024-03-27: qty 30, 30d supply, fill #0

## 2023-12-14 ENCOUNTER — Emergency Department (HOSPITAL_BASED_OUTPATIENT_CLINIC_OR_DEPARTMENT_OTHER): Admission: EM | Admit: 2023-12-14 | Discharge: 2023-12-14 | Disposition: A | Discharge status: Home / Self Care

## 2023-12-14 ENCOUNTER — Encounter (HOSPITAL_BASED_OUTPATIENT_CLINIC_OR_DEPARTMENT_OTHER): Payer: Self-pay

## 2023-12-14 DIAGNOSIS — I10 Essential (primary) hypertension: Secondary | ICD-10-CM | POA: Insufficient documentation

## 2023-12-14 DIAGNOSIS — L739 Follicular disorder, unspecified: Secondary | ICD-10-CM | POA: Diagnosis not present

## 2023-12-14 DIAGNOSIS — Z7982 Long term (current) use of aspirin: Secondary | ICD-10-CM | POA: Insufficient documentation

## 2023-12-14 DIAGNOSIS — R1030 Lower abdominal pain, unspecified: Secondary | ICD-10-CM | POA: Diagnosis present

## 2023-12-14 DIAGNOSIS — Z79899 Other long term (current) drug therapy: Secondary | ICD-10-CM | POA: Diagnosis not present

## 2023-12-14 MED ORDER — DOXYCYCLINE HYCLATE 100 MG PO TABS
100.0000 mg | ORAL_TABLET | Freq: Once | ORAL | Status: AC
  Administered 2023-12-14: 100 mg via ORAL
  Filled 2023-12-14: qty 1

## 2023-12-14 NOTE — ED Provider Notes (Signed)
 [place] [facility] Provider Note   CSN: [id number] Arrival date & time: 12/14/23  9171     Patient presents with: Abscess   Katherine Wiggins is a 58 y.o. female.   58 year old female with past medical history of fibromyalgia and hypertension presenting to the emergency department today with some irritation in her suprapubic region.  Patient states this been going now for the past few days.  She reports that the area is painful.  She has tried some Desitin cream on this.  She states that this did spontaneously drain a small amount of blood yesterday.  She came to the ER today due to ongoing symptoms.  She denies any fevers.   Abscess      Prior to Admission medications   Medication Sig Start Date End Date Taking? Authorizing Provider  albuterol  (VENTOLIN  HFA) 108 (90 Base) MCG/ACT inhaler INHALE 1 - 2 PUFFS INTO THE LUNGS EVERY 6 HOURS AS NEEDED FOR WHEEZING OR SHORTNESS OF BREATH 04/03/20 04/03/21  Long, Joshua G, MD  albuterol  (VENTOLIN  HFA) 108 (90 Base) MCG/ACT inhaler INHALE 1 - 2 PUFFS INTO THE LUNGS EVERY 6 HOURS AS NEEDED FOR WHEEZING OR SHORTNESS OF BREATH 06/21/21     albuterol  (VENTOLIN  HFA) 108 (90 Base) MCG/ACT inhaler Inhale 1-2 puffs into the lungs every 6 (six) hours as needed for wheezing or shortness of breath 05/16/23     amitriptyline  (ELAVIL ) 25 MG tablet Take 1 tablet (25 mg total) by mouth nightly. 06/21/21     amoxicillin  (AMOXIL ) 500 MG capsule Take 1 capsule (500 mg total) by mouth three times a day with food until gone. 04/16/21     amoxicillin -clavulanate (AUGMENTIN ) 875-125 MG tablet Take 1 tablet by mouth every 12 (twelve) hours. 10/13/23   Floyd, Dan, DO  aspirin 81 MG tablet Take 81 mg by mouth daily.    [provider]  benzonatate  (TESSALON ) 100 MG capsule Take 1 capsule (100 mg total) by mouth every 8 (eight) hours. 10/13/23   Emil Share, DO  buPROPion  (WELLBUTRIN  XL) 150 MG 24 hr tablet Take 1 tablet (150 mg  total) by mouth every morning. 01/28/23     buPROPion  (WELLBUTRIN  XL) 150 MG 24 hr tablet Take 1 tablet (150 mg total) by mouth in the morning. 05/16/23     cefdinir  (OMNICEF ) 300 MG capsule Take 1 capsule (300 mg total) by mouth 2 (two) times daily. 07/04/22   Silver Wonda LABOR, PA  celecoxib  (CELEBREX ) 100 MG capsule Take 1 capsule (100 mg total) by mouth once daily for pelvic pain. 06/11/23     cetirizine  (ZYRTEC ) 10 MG tablet TAKE 1 TABLET (10 MG TOTAL) BY MOUTH DAILY. 01/03/20 01/02/21  Beverley Leita LABOR, PA-C  citalopram  (CELEXA ) 40 MG tablet TAKE 1 TABLET (40 MG TOTAL) BY MOUTH DAILY. 05/02/20 05/02/21  Evangelina Tinnie Norris, PA-C  citalopram  (CELEXA ) 40 MG tablet Take 1 tablet (40 mg total) by mouth daily. 05/16/23     clindamycin  (CLEOCIN ) 300 MG capsule Take 1 capsule (300 mg total) by mouth every 12 (twelve) hours. 11/14/23     cyclobenzaprine  (FLEXERIL ) 10 MG tablet Take 1 tablet (10 mg total) by mouth 3 (three) times daily as needed for msucle spasms for up to 7 days. 07/28/22     doxycycline  (VIBRAMYCIN ) 100 MG capsule Take 1 capsule (100 mg total) by mouth 2 (two) times daily for 10 days. 06/11/23     estradiol  (ESTRACE ) 0.1 MG/GM vaginal cream Apply 1 gram per vagina  every night for 2 weeks, then apply three times a week 01/21/22   Ajewole, Christana, MD  fluconazole  (DIFLUCAN ) 150 MG tablet Take 1 tablet (150 mg total) by mouth daily. 12/17/22   Dameron, Marisa, DO  fluconazole  (DIFLUCAN ) 150 MG tablet Take 1 tablet (150 mg total) by mouth once a week. 11/11/23     fluticasone  (FLONASE ) 50 MCG/ACT nasal spray PLACE 1 SPRAY INTO BOTH NOSTRILS DAILY. 01/03/20 01/02/21  Beverley Leita LABOR, PA-C  gabapentin  (NEURONTIN ) 300 MG capsule Take 1 capsule (300 mg total) by mouth at bedtime. 11/26/23     gatifloxacin  (ZYMAXID ) 0.5 % SOLN Place 1 drop into the left eye four times daily as directed Patient not taking: Reported on 01/16/2022 06/05/21   Lavonia Lye, MD  hydrochlorothiazide  (HYDRODIURIL ) 25 MG  tablet Take by mouth. 05/27/18   [provider]  hydrochlorothiazide  (HYDRODIURIL ) 25 MG tablet TAKE 1 TABLET (25 MG TOTAL) BY MOUTH DAILY. 05/02/20 05/02/21  Evangelina Tinnie Norris, PA-C  ketorolac  (ACULAR ) 0.5 % ophthalmic solution Place 1 drop into the left eye four times daily as directed Patient not taking: Reported on 01/16/2022 06/05/21   Lavonia Lye, MD  lidocaine  (LIDODERM ) 5 % Place 1 patch onto the skin daily. Remove and discard patch within 12 hours or as directed ny MD. 07/28/22     LORazepam  (ATIVAN ) 2 MG tablet Take 1 tablet by mouth 1 hour pre-op with a small sip of water 10/19/20     meloxicam  (MOBIC ) 15 MG tablet Take 1 tablet (15 mg total) by mouth daily. 04/20/23   Flint Sonny POUR, PA-C  methylPREDNISolone  (MEDROL  DOSEPAK) 4 MG TBPK tablet Take As Directed On Package 08/08/22     metoprolol  succinate (TOPROL -XL) 100 MG 24 hr tablet TAKE 1 TABLET (100 MG TOTAL) BY MOUTH EVERY MORNING. 05/02/20 05/02/21  Evangelina Tinnie Norris, PA-C  metoprolol  succinate (TOPROL -XL) 100 MG 24 hr tablet Take 1 tablet (100 mg total) by mouth every morning. **NO MORE REFILLS-NEEDS AN OFFICE VISIT** Patient not taking: Reported on 01/16/2022 05/21/21     metoprolol  succinate (TOPROL -XL) 100 MG 24 hr tablet Take 1 tablet (100 mg total) by mouth every morning. 07/18/22     metoprolol  succinate (TOPROL -XL) 100 MG 24 hr tablet Take 1 tablet (100 mg total) by mouth every morning. 05/16/23     metoprolol  succinate (TOPROL -XL) 25 MG 24 hr tablet TAKE 1 TABLET BY MOUTH DAILY WITH 100MG  TABLET **NEEDS AN OFFICE VISIT** 02/09/20 01/16/22  Evangelina Tinnie Norris, PA-C  metoprolol  succinate (TOPROL -XL) 25 MG 24 hr tablet TAKE 1 TABLET (25 MG TOTAL) BY MOUTH DAILY WITH 100 MG TABLET. 06/21/21     metoprolol  succinate (TOPROL -XL) 50 MG 24 hr tablet Take 1 tablet (50 mg total) by mouth at bedtime. Continue metoprolol  ER 100 mg every morning. 05/05/23     metoprolol  succinate (TOPROL -XL) 50 MG 24 hr tablet Take 1  tablet (50 mg total) by mouth at bedtime. Continue metoprolol  ER 100 mg every morning. 05/16/23     metoprolol  tartrate (LOPRESSOR ) 100 MG tablet Take 100 mg by mouth 2 (two) times daily.    [provider]  naproxen  (NAPROSYN ) 500 MG tablet Take 1 tablet (500 mg total) by mouth 2 (two) times daily with a meal. 07/28/22     nystatin  cream (MYCOSTATIN ) Apply to affected area 2 times daily 12/17/22   Dameron, Marisa, DO  ondansetron  (ZOFRAN -ODT) 4 MG disintegrating tablet Take 1 tablet (4 mg total) by mouth every 4 (four) hours as needed nausea/vomiting. 10/13/23  Emil Share, DO  pantoprazole  (PROTONIX ) 20 MG tablet TAKE 1 TABLET (20 MG TOTAL) BY MOUTH DAILY. 05/02/20 05/02/21  Evangelina Tinnie Norris, PA-C  pantoprazole  (PROTONIX ) 20 MG tablet TAKE 1 TABLET (20 MG TOTAL) BY MOUTH DAILY. **NEEDS AN OFFICE VISIT** 02/04/20 02/03/21  Evangelina Tinnie Norris, PA-C  pantoprazole  (PROTONIX ) 20 MG tablet Take 1 tablet (20 mg total) by mouth every morning before breakfast. 05/16/23     potassium chloride  SA (KLOR-CON  M) 20 MEQ tablet TAKE 1 TABLET (20 MEQ TOTAL) BY MOUTH DAILY. 05/02/20 05/27/21  Evangelina Tinnie Norris, PA-C  potassium chloride  SA (KLOR-CON  M) 20 MEQ tablet Take 1 tablet (20 mEq total) by mouth daily. **KEEP UPCOMING APPT.** 05/13/23     prednisoLONE  acetate (PRED FORTE ) 1 % ophthalmic suspension Place 1 drop into the left eye four times daily as directed. **Shake well before each use** Patient not taking: Reported on 01/16/2022 06/05/21   Lavonia Lye, MD  predniSONE  (DELTASONE ) 50 MG tablet Take 1 tablet (50 mg total) by mouth daily with breakfast. 06/02/22   Clark, Meghan R, PA-C  simvastatin  (ZOCOR ) 40 MG tablet TAKE 1 TABLET (40 MG TOTAL) BY MOUTH DAILY. 05/02/20 05/02/21  Evangelina Tinnie Norris, PA-C  simvastatin  (ZOCOR ) 40 MG tablet TAKE 1 TABLET BY MOUTH ONCE DAILY 05/24/19 05/23/20  Evangelina Tinnie Norris, PA-C  simvastatin  (ZOCOR ) 40 MG tablet Take 1 tablet (40 mg total) by  mouth at bedtime. **NEED AN OFFICE VISIT** 01/16/23     simvastatin  (ZOCOR ) 40 MG tablet Take 1 tablet (40 mg total) by mouth at bedtime. 05/16/23     valACYclovir  (VALTREX ) 500 MG tablet Take 1 tablet (500 mg total) by mouth daily. 07/25/22     valACYclovir  (VALTREX ) 500 MG tablet Take 1 tablet (500 mg total) by mouth daily. 05/16/23       Allergies: Codeine, Nitrofurantoin, Sulfamethoxazole-trimethoprim, and Septra [sulfamethoxazole w/trimethoprim (co-trimoxazole)]    Review of Systems  Skin:  Positive for wound.  All other systems reviewed and are negative.   Updated Vital Signs BP 129/78 (BP Location: Right Arm)   Pulse 64   Temp 97.9 F (36.6 C) (Oral)   Resp 18   Ht 5' 3 (1.6 m)   Wt 95.3 kg   SpO2 99%   BMI 37.20 kg/m   Physical Exam Vitals and nursing note reviewed.   General: No acute distress Cardiac: Regular rate and rhythm, no murmurs Respiratory: No respiratory distress Skin/GU chaperoned by female nursing staff shows what appears to be an indurated area in the suprapubic region that appears to have spontaneously drained with no significant fluctuance measuring roughly 3 cm in diameter, there is mild surrounding erythema  (all labs ordered are listed, but only abnormal results are displayed) Labs Reviewed - No data to display  EKG: None  Radiology: No results found.   SABRAUltrasound ED Soft Tissue  Date/Time: 12/14/2023 9:19 AM  Performed by: Ula Prentice SAUNDERS, MD Authorized by: Ula Prentice SAUNDERS, MD   Procedure details:    Indications: evaluate for cellulitis     Transverse view:  Visualized   Longitudinal view:  Visualized   Images: not archived   Location:    Location: groin   Findings:     no abscess present    cellulitis present    no foreign body present    Medications Ordered in the ED  doxycycline  (VIBRA -TABS) tablet 100 mg (has no administration in time range)  Medical Decision Making 58 year old female  presenting to the emergency department today with what appears to be folliculitis.  Ultrasound does not show any large drainable fluid collection here.  See above images.  I will start the patient on doxycycline  here.  She is encouraged to follow-up with her OB/GYN and to use warm compresses at home.  She is discharged with return precautions.  This does not appear to be consistent with Fournier's gangrene and seems to be a well-circumscribed lesion.  Risk Prescription drug management.        Final diagnoses:  Folliculitis    ED Discharge Orders     None          Ula Prentice SAUNDERS, MD 12/14/23 [phone]

## 2023-12-14 NOTE — ED Triage Notes (Signed)
 Pt states that she is unsure, but thinks that she has a boil on the top of her pelvic area. Denies shaving. States that she did have a little blood on the area.

## 2023-12-14 NOTE — Discharge Instructions (Signed)
 Please start the antibiotic as prescribed.  Use Tylenol  or ibuprofen  at home as needed for pain.  Use warm compresses every 4-6 hours to see if there is any further drainage.  Wash the area with soap and water.  Follow-up with your OB/GYN or return to the ER for worsening symptoms.

## 2024-02-02 ENCOUNTER — Other Ambulatory Visit (HOSPITAL_BASED_OUTPATIENT_CLINIC_OR_DEPARTMENT_OTHER): Payer: Self-pay

## 2024-02-03 ENCOUNTER — Other Ambulatory Visit: Payer: Self-pay

## 2024-02-03 ENCOUNTER — Other Ambulatory Visit (HOSPITAL_BASED_OUTPATIENT_CLINIC_OR_DEPARTMENT_OTHER): Payer: Self-pay

## 2024-02-03 MED ORDER — PANTOPRAZOLE SODIUM 20 MG PO TBEC
20.0000 mg | DELAYED_RELEASE_TABLET | Freq: Every morning | ORAL | 0 refills | Status: AC
  Filled 2024-02-03: qty 90, 90d supply, fill #0

## 2024-02-03 MED ORDER — SIMVASTATIN 40 MG PO TABS
40.0000 mg | ORAL_TABLET | Freq: Every day | ORAL | 0 refills | Status: AC
  Filled 2024-02-03: qty 90, 90d supply, fill #0

## 2024-02-03 MED ORDER — CITALOPRAM HYDROBROMIDE 40 MG PO TABS
40.0000 mg | ORAL_TABLET | Freq: Every day | ORAL | 0 refills | Status: AC
  Filled 2024-02-03: qty 90, 90d supply, fill #0

## 2024-02-03 MED ORDER — BUPROPION HCL ER (XL) 150 MG PO TB24
150.0000 mg | ORAL_TABLET | Freq: Every morning | ORAL | 0 refills | Status: AC
  Filled 2024-02-03: qty 90, 90d supply, fill #0

## 2024-02-06 ENCOUNTER — Other Ambulatory Visit (HOSPITAL_BASED_OUTPATIENT_CLINIC_OR_DEPARTMENT_OTHER): Payer: Self-pay

## 2024-02-11 ENCOUNTER — Other Ambulatory Visit (HOSPITAL_BASED_OUTPATIENT_CLINIC_OR_DEPARTMENT_OTHER): Payer: Self-pay

## 2024-03-15 ENCOUNTER — Other Ambulatory Visit (HOSPITAL_BASED_OUTPATIENT_CLINIC_OR_DEPARTMENT_OTHER): Payer: Self-pay

## 2024-03-15 MED ORDER — ONDANSETRON 4 MG PO TBDP
4.0000 mg | ORAL_TABLET | Freq: Three times a day (TID) | ORAL | 0 refills | Status: AC | PRN
  Filled 2024-03-15: qty 20, 7d supply, fill #0

## 2024-03-27 ENCOUNTER — Other Ambulatory Visit (HOSPITAL_COMMUNITY): Payer: Self-pay

## 2024-03-29 ENCOUNTER — Other Ambulatory Visit: Payer: Self-pay

## 2024-04-01 ENCOUNTER — Other Ambulatory Visit (HOSPITAL_BASED_OUTPATIENT_CLINIC_OR_DEPARTMENT_OTHER): Payer: Self-pay

## 2024-04-13 ENCOUNTER — Other Ambulatory Visit (HOSPITAL_BASED_OUTPATIENT_CLINIC_OR_DEPARTMENT_OTHER): Payer: Self-pay

## 2024-04-13 MED ORDER — METOPROLOL SUCCINATE ER 50 MG PO TB24
50.0000 mg | ORAL_TABLET | Freq: Every evening | ORAL | 1 refills | Status: AC
  Filled 2024-04-13: qty 90, 90d supply, fill #0
